# Patient Record
Sex: Female | Born: 1995
Health system: Southern US, Community
[De-identification: ages and names within clinical notes are randomized; demographics above are authoritative.]

## PROBLEM LIST (undated history)

## (undated) DIAGNOSIS — R519 Headache, unspecified: Secondary | ICD-10-CM

## (undated) DIAGNOSIS — F810 Specific reading disorder: Secondary | ICD-10-CM

## (undated) DIAGNOSIS — E039 Hypothyroidism, unspecified: Secondary | ICD-10-CM

## (undated) DIAGNOSIS — L299 Pruritus, unspecified: Secondary | ICD-10-CM

## (undated) DIAGNOSIS — F1311 Sedative, hypnotic or anxiolytic abuse, in remission: Secondary | ICD-10-CM

## (undated) DIAGNOSIS — F909 Attention-deficit hyperactivity disorder, unspecified type: Secondary | ICD-10-CM

## (undated) DIAGNOSIS — R413 Other amnesia: Secondary | ICD-10-CM

## (undated) DIAGNOSIS — R51 Headache: Secondary | ICD-10-CM

## (undated) DIAGNOSIS — Z87820 Personal history of traumatic brain injury: Secondary | ICD-10-CM

## (undated) DIAGNOSIS — L508 Other urticaria: Secondary | ICD-10-CM

## (undated) HISTORY — DX: Specific reading disorder: F81.0

## (undated) HISTORY — DX: Sedative, hypnotic or anxiolytic abuse, in remission: F13.11

## (undated) HISTORY — DX: Pruritus, unspecified: L29.9

## (undated) HISTORY — DX: Headache: R51

## (undated) HISTORY — DX: Personal history of traumatic brain injury: Z87.820

## (undated) HISTORY — DX: Attention-deficit hyperactivity disorder, unspecified type: F90.9

## (undated) HISTORY — DX: Other urticaria: L50.8

## (undated) HISTORY — PX: BUNIONECTOMY: SHX129

## (undated) HISTORY — DX: Other amnesia: R41.3

## (undated) HISTORY — DX: Headache, unspecified: R51.9

---
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
## 2016-01-07 ENCOUNTER — Telehealth: Payer: Self-pay | Admitting: Internal Medicine

## 2016-01-07 MED ORDER — ATOMOXETINE HCL 40 MG PO CAPS: 40.0000 mg | ORAL_CAPSULE | Freq: Two times a day (BID) | ORAL | Status: DC

## 2016-01-07 NOTE — Telephone Encounter (Signed)
Pt is requesting refill on Strattera. Dr. Leta JunglingPaz's Pt.  Last OV: 06/02/2015 Last Fill: 09/22/2015 #60 and 1RF UDS: None

## 2016-01-07 NOTE — Telephone Encounter (Signed)
°  Relationship to patient: Self  Can be reached: [phone]   Reason for call: Request refill on atomoxetine (STRATTERA) 40 MG capsule [[id number]][[id number]]   Please leave message on VM when ready for pick up

## 2016-01-08 DIAGNOSIS — F902 Attention-deficit hyperactivity disorder, combined type: Secondary | ICD-10-CM | POA: Diagnosis not present

## 2016-01-15 DIAGNOSIS — F902 Attention-deficit hyperactivity disorder, combined type: Secondary | ICD-10-CM | POA: Diagnosis not present

## 2016-01-22 DIAGNOSIS — F902 Attention-deficit hyperactivity disorder, combined type: Secondary | ICD-10-CM | POA: Diagnosis not present

## 2016-01-28 DIAGNOSIS — F902 Attention-deficit hyperactivity disorder, combined type: Secondary | ICD-10-CM | POA: Diagnosis not present

## 2016-02-03 ENCOUNTER — Encounter: Payer: Self-pay | Admitting: Physician Assistant

## 2016-02-03 ENCOUNTER — Ambulatory Visit (INDEPENDENT_AMBULATORY_CARE_PROVIDER_SITE_OTHER): Payer: BLUE CROSS/BLUE SHIELD | Admitting: Physician Assistant

## 2016-02-03 VITALS — BP 104/66 | HR 100 | Temp 100.0°F | Resp 16 | Ht 69.5 in | Wt 149.5 lb

## 2016-02-03 DIAGNOSIS — J020 Streptococcal pharyngitis: Secondary | ICD-10-CM

## 2016-02-03 LAB — POCT RAPID STREP A (OFFICE): Rapid Strep A Screen: NEGATIVE

## 2016-02-03 MED ORDER — AMOXICILLIN 500 MG PO CAPS: 500.0000 mg | ORAL_CAPSULE | Freq: Two times a day (BID) | ORAL | 0 refills | Status: DC

## 2016-02-03 NOTE — Progress Notes (Signed)
Patient presents to clinic today c/o 3 days of significant sore throat, fever, chills and body aches. Denies nasal congestion, cough or chest congestion. Denies recent travel or sick contact. Patient has not taken anything for symptoms.  Past Medical History:  Diagnosis Date  . ADHD (attention deficit hyperactivity disorder)   . H/O multiple concussions    seen by neuro  . Headache    frequent  . Short-term memory loss    cause from multiple concussions?    Current Outpatient Prescriptions on File Prior to Visit  Medication Sig Dispense Refill  . atomoxetine (STRATTERA) 40 MG capsule Take 1 capsule (40 mg total) by mouth 2 (two) times daily. 60 capsule 3  . etonogestrel (IMPLANON) 68 MG IMPL implant Inject 1 each into the skin once.    . meloxicam (MOBIC) 15 MG tablet TK 1 T PO QD AS NEEDED WF  6  . methocarbamol (ROBAXIN) 500 MG tablet Take 1 tablet (500 mg total) by mouth 2 (two) times daily. (Patient taking differently: Take 500 mg by mouth 2 (two) times daily as needed. ) 20 tablet 0   No current facility-administered medications on file prior to visit.     No Known Allergies  Family History  Problem Relation Age of Onset  . Adopted: Yes  . Family history unknown: Yes    Social History   Social History  . Marital status: Single    Spouse name: N/A  . Number of children: 0  . Years of education: N/A   Occupational History  . senior in [employer]    Social History Main Topics  . Smoking status: Never Smoker  . Smokeless tobacco: Never Used  . Alcohol use No  . Drug use: No  . Sexual activity: Yes    Birth control/ protection: Implant   Other Topics Concern  . None   Social History Narrative   Household-- father and mother    Going to [place] [employer], [place]   To play Volleyball    Review of Systems - See HPI.  All other ROS are negative.  BP 104/66 (BP Location: Right Arm, Patient Position: Sitting, Cuff Size: Normal)   Pulse 100   Temp 100 F (37.8  C) (Oral)   Resp 16   Ht 5' 9.5" (1.765 m)   Wt 149 lb 8 oz (67.8 kg)   SpO2 99%   BMI 21.76 kg/m   Physical Exam  Constitutional: She is oriented to person, place, and time and well-developed, well-nourished, and in no distress.  HENT:  Head: Normocephalic and atraumatic.  Right Ear: Tympanic membrane normal.  Left Ear: Tympanic membrane normal.  Nose: Nose normal.  Mouth/Throat: Uvula is midline and mucous membranes are normal. Oropharyngeal exudate and posterior oropharyngeal erythema present. No tonsillar abscesses.  Eyes: Conjunctivae are normal.  Cardiovascular: Normal rate, regular rhythm, normal heart sounds and intact distal pulses.   Pulmonary/Chest: Effort normal and breath sounds normal. No respiratory distress. She has no wheezes. She has no rales. She exhibits no tenderness.  Lymphadenopathy:       Head (right side): Tonsillar adenopathy present.       Head (left side): Tonsillar adenopathy present.    She has no cervical adenopathy.  Neurological: She is alert and oriented to person, place, and time.  Skin: Skin is warm and dry. No rash noted.  Psychiatric: Affect normal.  Vitals reviewed.  Assessment/Plan: 1. Streptococcal sore throat Rapid strep negative. Patient with fever, tonisllar adenopathy, tonsillar exudate and  absence of cough. Will send for culture but will empirically treat for Strep. Rx Amoxicillin. Supportive measures reviewed. FU if not resolving.  - amoxicillin (AMOXIL) 500 MG capsule; Take 1 capsule (500 mg total) by mouth 2 (two) times daily.  Dispense: 20 capsule; Refill: 0 - Culture, Group A Strep   Piedad ClimesMartin, Oreta Soloway Cody, PA-C

## 2016-02-03 NOTE — Patient Instructions (Signed)
Please stay well hydrated. Take the antibiotic as directed with food. Use Tylenol or ibuprofen for throat pain, fever and aches. Salt water gargles may also be beneficial. Your contagious until on antibiotics for 24 hours. Stay home from work today. Follow-up if symptoms not resolving with antibiotic. I will call you with your throat culture results.   Strep Throat Strep throat is an infection of the throat. It is caused by germs. Strep throat spreads from person to person because of coughing, sneezing, or close contact. HOME CARE Medicines  Take over-the-counter and prescription medicines only as told by your doctor.  Take your antibiotic medicine as told by your doctor. Do not stop taking the medicine even if you feel better.  Have family members who also have a sore throat or fever go to a doctor. Eating and Drinking  Do not share food, drinking cups, or personal items.  Try eating soft foods until your sore throat feels better.  Drink enough fluid to keep your pee (urine) clear or pale yellow. General Instructions  Rinse your mouth (gargle) with a salt-water mixture 3-4 times per day or as needed. To make a salt-water mixture, stir -1 tsp of salt into 1 cup of warm water.  Make sure that all people in your house wash their hands well.  Rest.  Stay home from school or work until you have been taking antibiotics for 24 hours.  Keep all follow-up visits as told by your doctor. This is important. GET HELP IF:  Your neck keeps getting bigger.  You get a rash, cough, or earache.  You cough up thick liquid that is green, yellow-brown, or bloody.  You have pain that does not get better with medicine.  Your problems get worse instead of getting better.  You have a fever. GET HELP RIGHT AWAY IF:  You throw up (vomit).  You get a very bad headache.  You neck hurts or it feels stiff.  You have chest pain or you are short of breath.  You have drooling, very bad  throat pain, or changes in your voice.  Your neck is swollen or the skin gets red and tender.  Your mouth is dry or you are peeing less than normal.  You keep feeling more tired or it is hard to wake up.  Your joints are red or they hurt.   This information is not intended to replace advice given to you by your health care provider. Make sure you discuss any questions you have with your health care provider.   Document Released: 11/24/2007 Document Revised: 02/26/2015 Document Reviewed: 09/30/2014 Elsevier Interactive Patient Education [employer]2016 Elsevier Inc.

## 2016-02-05 LAB — CULTURE, GROUP A STREP: Organism ID, Bacteria: NORMAL

## 2016-02-12 DIAGNOSIS — F902 Attention-deficit hyperactivity disorder, combined type: Secondary | ICD-10-CM | POA: Diagnosis not present

## 2016-03-09 DIAGNOSIS — Z1389 Encounter for screening for other disorder: Secondary | ICD-10-CM | POA: Diagnosis not present

## 2016-03-09 DIAGNOSIS — Z01419 Encounter for gynecological examination (general) (routine) without abnormal findings: Secondary | ICD-10-CM | POA: Diagnosis not present

## 2016-03-09 DIAGNOSIS — Z3009 Encounter for other general counseling and advice on contraception: Secondary | ICD-10-CM | POA: Diagnosis not present

## 2016-03-09 DIAGNOSIS — Z130 Encounter for screening for diseases of the blood and blood-forming organs and certain disorders involving the immune mechanism: Secondary | ICD-10-CM | POA: Diagnosis not present

## 2016-03-09 DIAGNOSIS — Z6822 Body mass index (BMI) 22.0-22.9, adult: Secondary | ICD-10-CM | POA: Diagnosis not present

## 2016-03-09 DIAGNOSIS — Z113 Encounter for screening for infections with a predominantly sexual mode of transmission: Secondary | ICD-10-CM | POA: Diagnosis not present

## 2016-06-28 ENCOUNTER — Other Ambulatory Visit: Payer: Self-pay | Admitting: Family Medicine

## 2016-06-29 ENCOUNTER — Other Ambulatory Visit: Payer: Self-pay | Admitting: Internal Medicine

## 2016-06-29 ENCOUNTER — Telehealth: Payer: Self-pay | Admitting: Internal Medicine

## 2016-06-29 MED ORDER — ATOMOXETINE HCL 40 MG PO CAPS: 40.0000 mg | ORAL_CAPSULE | Freq: Two times a day (BID) | ORAL | 0 refills | Status: DC

## 2016-06-29 NOTE — Telephone Encounter (Signed)
Please inform Pt that 30 day supply has been sent, will need appt w/ PCP for further refills. Thank you.

## 2016-06-29 NOTE — Telephone Encounter (Signed)
Patient is requesting a refill of atomoxetine (STRATTERA) 40 MG capsule Please advise   Pharmacy: [facility] [id number] - [place], [place] - [address] AT [facility] [address]

## 2016-06-29 NOTE — Telephone Encounter (Signed)
Patient aware. She will call to schedule her appt once she gets her work schedule.

## 2016-06-29 NOTE — Telephone Encounter (Signed)
Pt is requesting refill on Strattera.  Last OV: 06/02/2015, no future appt scheduled Last Fill: 01/07/2016 #60 and 3RF UDS: None  Please advise.

## 2016-06-29 NOTE — Telephone Encounter (Signed)
Needs office visit Okay to prescribed #30 but needs an appointment for a checkup

## 2016-07-10 DIAGNOSIS — Z113 Encounter for screening for infections with a predominantly sexual mode of transmission: Secondary | ICD-10-CM | POA: Diagnosis not present

## 2016-07-26 ENCOUNTER — Other Ambulatory Visit: Payer: Self-pay | Admitting: Internal Medicine

## 2016-07-29 ENCOUNTER — Encounter: Payer: Self-pay | Admitting: Internal Medicine

## 2016-07-29 ENCOUNTER — Ambulatory Visit (INDEPENDENT_AMBULATORY_CARE_PROVIDER_SITE_OTHER): Payer: BLUE CROSS/BLUE SHIELD | Admitting: Internal Medicine

## 2016-07-29 VITALS — BP 116/66 | HR 72 | Temp 97.6°F | Resp 12 | Ht 69.5 in | Wt 147.4 lb

## 2016-07-29 DIAGNOSIS — Z114 Encounter for screening for human immunodeficiency virus [HIV]: Secondary | ICD-10-CM

## 2016-07-29 DIAGNOSIS — F418 Other specified anxiety disorders: Secondary | ICD-10-CM | POA: Diagnosis not present

## 2016-07-29 DIAGNOSIS — F32A Depression, unspecified: Secondary | ICD-10-CM

## 2016-07-29 DIAGNOSIS — R634 Abnormal weight loss: Secondary | ICD-10-CM

## 2016-07-29 DIAGNOSIS — Z23 Encounter for immunization: Secondary | ICD-10-CM | POA: Diagnosis not present

## 2016-07-29 DIAGNOSIS — F988 Other specified behavioral and emotional disorders with onset usually occurring in childhood and adolescence: Secondary | ICD-10-CM | POA: Diagnosis not present

## 2016-07-29 DIAGNOSIS — F419 Anxiety disorder, unspecified: Secondary | ICD-10-CM

## 2016-07-29 DIAGNOSIS — F329 Major depressive disorder, single episode, unspecified: Secondary | ICD-10-CM

## 2016-07-29 LAB — CBC WITH DIFFERENTIAL/PLATELET
Basophils Absolute: 0.1 10*3/uL (ref 0.0–0.1)
Basophils Relative: 0.6 % (ref 0.0–3.0)
Eosinophils Absolute: 0.2 10*3/uL (ref 0.0–0.7)
Eosinophils Relative: 1.7 % (ref 0.0–5.0)
HCT: 45 % (ref 36.0–49.0)
Hemoglobin: 15.5 g/dL (ref 12.0–16.0)
Lymphocytes Relative: 21 % — ABNORMAL LOW (ref 24.0–48.0)
Lymphs Abs: 2 10*3/uL (ref 0.7–4.0)
MCHC: 34.3 g/dL (ref 31.0–37.0)
MCV: 92.7 fl (ref 78.0–98.0)
Monocytes Absolute: 0.7 10*3/uL (ref 0.1–1.0)
Monocytes Relative: 6.8 % (ref 3.0–12.0)
Neutro Abs: 6.8 10*3/uL (ref 1.4–7.7)
Neutrophils Relative %: 69.9 % (ref 43.0–71.0)
Platelets: 289 10*3/uL (ref 150.0–575.0)
RBC: 4.86 Mil/uL (ref 3.80–5.70)
RDW: 13.1 % (ref 11.4–15.5)
WBC: 9.7 10*3/uL (ref 4.5–13.5)

## 2016-07-29 LAB — COMPREHENSIVE METABOLIC PANEL
ALT: 12 U/L (ref 0–35)
AST: 15 U/L (ref 0–37)
Albumin: 4.6 g/dL (ref 3.5–5.2)
Alkaline Phosphatase: 58 U/L (ref 47–119)
BUN: 10 mg/dL (ref 6–23)
CO2: 27 mEq/L (ref 19–32)
Calcium: 9.7 mg/dL (ref 8.4–10.5)
Chloride: 108 mEq/L (ref 96–112)
Creatinine, Ser: 0.89 mg/dL (ref 0.40–1.20)
GFR: 86.3 mL/min (ref >60.00)
Glucose, Bld: 86 mg/dL (ref 70–99)
Potassium: 3.6 mEq/L (ref 3.5–5.1)
Sodium: 140 mEq/L (ref 135–145)
Total Bilirubin: 1.1 mg/dL (ref 0.2–1.2)
Total Protein: 7.6 g/dL (ref 6.0–8.3)

## 2016-07-29 LAB — TSH: TSH: 0.79 u[IU]/mL (ref 0.40–5.00)

## 2016-07-29 MED ORDER — ATOMOXETINE HCL 40 MG PO CAPS: 40.0000 mg | ORAL_CAPSULE | Freq: Two times a day (BID) | ORAL | 2 refills | Status: DC

## 2016-07-29 NOTE — Progress Notes (Signed)
Pre visit review using our clinic review tool, if applicable. No additional management support is needed unless otherwise documented below in the visit note. 

## 2016-07-29 NOTE — Progress Notes (Signed)
Subjective:    Patient ID: Tabitha Hawkins, female    DOB: 09/19/1995, 21 y.o.   MRN: [id number]  DOS:  07/29/2016 Type of visit - description : Routine office visit Interval history: Here for refill of Strattera, she takes it daily, feeling great, it help her ADD without apparent side effects. I also noted significant weight loss.  She does have some anxiety and depression, not a major issue now, sees her counselor. She sleeps well Her appetite is reportedly okay. She denies being eating although there are days that she is very hungry and others she is not. Denies any self-induced vomiting. No suicidal ideas No abdominal pain, nausea vomiting . Occasional diarrhea particularly when she does not take her medication.   Wt Readings from Last 3 Encounters:  07/29/16 147 lb 6 oz (66.8 kg) (78 %, Z= 0.78)*  02/03/16 149 lb 8 oz (67.8 kg) (81 %, Z= 0.89)*  07/28/15 180 lb (81.6 kg) (95 %, Z= 1.65)*   * Growth percentiles are based on CDC 2-20 Years data.     Review of Systems See above No chest pain or palpitation  Past Medical History:  Diagnosis Date  . ADHD (attention deficit hyperactivity disorder)   . H/O multiple concussions    seen by neuro  . Headache    frequent  . Short-term memory loss    cause from multiple concussions?    Past Surgical History:  Procedure Laterality Date  . KNEE ARTHROSCOPY Left 2016    Social History   Social History  . Marital status: Single    Spouse name: N/A  . Number of children: 0  . Years of education: N/A   Occupational History  . waitress     Social History Main Topics  . Smoking status: Never Smoker  . Smokeless tobacco: Never Used  . Alcohol use No  . Drug use: No  . Sexual activity: Yes    Birth control/ protection: Implant   Other Topics Concern  . Not on file   Social History Narrative   Household-- father and mother    Stopped [employer]t [place]ussion, couldn't play volley-ball 05-2015            Allergies as of 07/29/2016   No Known Allergies     Medication List       Accurate as of 07/29/16 11:59 PM. Always use your most recent med list.          atomoxetine 40 MG capsule Commonly known as:  STRATTERA Take 1 capsule (40 mg total) by mouth 2 (two) times daily.   etonogestrel 68 MG Impl implant Commonly known as:  NEXPLANON Inject 1 each into the skin once.          Objective:   Physical Exam BP 116/66 (BP Location: Left Arm, Patient Position: Sitting, Cuff Size: Small)   Pulse 72   Temp 97.6 F (36.4 C) (Oral)   Resp 12   Ht 5' 9.5" (1.765 m)   Wt 147 lb 6 oz (66.8 kg)   SpO2 99%   BMI 21.45 kg/m  General:   Well developed, well nourished . NAD.  HEENT:  Normocephalic . Face symmetric, atraumatic. Neck: No thyromegaly Lungs:  CTA B Normal respiratory effort, no intercostal retractions, no accessory muscle use. Heart: RRR,  no murmur.  no pretibial edema bilaterally  Abdomen:  Not distended, soft, non-tender. No rebound or rigidity.  Skin: Not pale. Not jaundice Neurologic:  alert &  oriented X3.  Speech normal, gait appropriate for age and unassisted Psych--  Cognition and judgment appear intact.  Cooperative with normal attention span and concentration.  Behavior appropriate. No anxious or depressed appearing.    Assessment & Plan:   Assessment ADD- Saw a psychologist 2015, DX ADHD, inattentive type. Rx other of her first time 08-2013 Postconcussion syndrome: DX 05-2015, saw neuro- d/c college sports  Neuropsychological evaluation 08/13/2015 referred by neurology, report reviewed, dx  ADD with hyperactivity; + sx of  postconcussion including headaches. Anxiety-depression  PLAN: ADD: Well-controlled with Strattera, RF meds Weight loss: Started to loose wt last year, that coincide with the switch from Adderall to Strattera 04-2015 and leaving college 05/2015 due to a head concussion, she also stopped playing sports. I am somewhat  concerned about her weight loss, she however looks healthy, denies depression, no obvious eating disorders. Her BMI is still healthy. Plan: CMP, CBC, TSH. HIV screening. Recheck in 3 months. 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 She sees a counselor Theador Hawthorne as needed. RTC 3 months

## 2016-07-29 NOTE — Patient Instructions (Addendum)
GO TO THE LAB : Get the blood work     GO TO THE FRONT DESK Schedule your next appointment for a  checkup in 3 months  

## 2016-07-30 ENCOUNTER — Ambulatory Visit: Payer: BLUE CROSS/BLUE SHIELD | Admitting: Internal Medicine

## 2016-07-30 DIAGNOSIS — F329 Major depressive disorder, single episode, unspecified: Secondary | ICD-10-CM

## 2016-07-30 DIAGNOSIS — F419 Anxiety disorder, unspecified: Secondary | ICD-10-CM | POA: Insufficient documentation

## 2016-07-30 DIAGNOSIS — F32A Depression, unspecified: Secondary | ICD-10-CM | POA: Insufficient documentation

## 2016-07-30 HISTORY — DX: Major depressive disorder, single episode, unspecified: F32.9

## 2016-07-30 LAB — HIV ANTIBODY (ROUTINE TESTING W REFLEX): HIV 1&2 Ab, 4th Generation: NONREACTIVE

## 2016-07-30 NOTE — Assessment & Plan Note (Signed)
ADD: Well-controlled with Strattera, RF meds Weight loss: Started to loose wt last year, that coincide with the switch from Adderall to Strattera 04-2015 and leaving college 05/2015 due to a head concussion, she also stopped playing sports. I am somewhat concerned about her weight loss, she however looks healthy, denies depression, no obvious eating disorders. Her BMI is still healthy. Plan: CMP, CBC, TSH. HIV screening. Recheck in 3 months. 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 She sees a counselor Theador HawthorneAlicia Brown as needed. RTC 3 months

## 2016-09-29 ENCOUNTER — Telehealth: Payer: Self-pay | Admitting: Internal Medicine

## 2016-09-29 NOTE — Telephone Encounter (Signed)
Caller name: Relationship to patient: Self Can be reached: [phone]  Pharmacy:  Reason for call: patient requesting info on stopping atomoxetine (STRATTERA) 40 MG capsule

## 2016-09-29 NOTE — Telephone Encounter (Signed)
Please advise. Pt is due for 3 month follow-up 10/2016 (no future appt scheduled).

## 2016-09-30 NOTE — Telephone Encounter (Signed)
Spoke w/ Pt, informed of recommendations, she has already been taking only 1 tablet of Strattera daily, informed she can stop when ready and to let us know if problems. Pt verbalized understanding.

## 2016-09-30 NOTE — Telephone Encounter (Signed)
The medication can be stopped abruptly however I would prefer her to stop gradually. Strattera 10 mg: 2 tablets daily for 2 weeks, then one tablet daily for 2 weeks, then stop call #30, 1 refill Monitory symptoms of ADHD, call if problems.

## 2016-11-11 DIAGNOSIS — Z113 Encounter for screening for infections with a predominantly sexual mode of transmission: Secondary | ICD-10-CM | POA: Diagnosis not present

## 2016-11-11 DIAGNOSIS — N760 Acute vaginitis: Secondary | ICD-10-CM | POA: Diagnosis not present

## 2016-11-11 DIAGNOSIS — Z3049 Encounter for surveillance of other contraceptives: Secondary | ICD-10-CM | POA: Diagnosis not present

## 2016-11-11 DIAGNOSIS — L293 Anogenital pruritus, unspecified: Secondary | ICD-10-CM | POA: Diagnosis not present

## 2016-11-18 DIAGNOSIS — N760 Acute vaginitis: Secondary | ICD-10-CM | POA: Diagnosis not present

## 2016-11-18 DIAGNOSIS — Z113 Encounter for screening for infections with a predominantly sexual mode of transmission: Secondary | ICD-10-CM | POA: Diagnosis not present

## 2016-11-18 DIAGNOSIS — L293 Anogenital pruritus, unspecified: Secondary | ICD-10-CM | POA: Diagnosis not present

## 2016-12-30 DIAGNOSIS — B002 Herpesviral gingivostomatitis and pharyngotonsillitis: Secondary | ICD-10-CM | POA: Diagnosis not present

## 2017-01-27 DIAGNOSIS — Z3049 Encounter for surveillance of other contraceptives: Secondary | ICD-10-CM | POA: Diagnosis not present

## 2017-02-03 DIAGNOSIS — J029 Acute pharyngitis, unspecified: Secondary | ICD-10-CM | POA: Diagnosis not present

## 2017-02-03 DIAGNOSIS — J069 Acute upper respiratory infection, unspecified: Secondary | ICD-10-CM | POA: Diagnosis not present

## 2017-02-07 ENCOUNTER — Ambulatory Visit: Payer: BLUE CROSS/BLUE SHIELD | Admitting: Internal Medicine

## 2017-02-07 ENCOUNTER — Telehealth: Payer: Self-pay | Admitting: Internal Medicine

## 2017-02-07 NOTE — Telephone Encounter (Signed)
Caller name: Marchi,[name] Relation to pt: mother  Call back [phone] Pharmacy: [id number] - [place], [place] - [address] AT [facility] OF [address] & [facility] [address] [phone] (Phone) [phone] (Fax)     Reason for call:  Patient was seen at [facility] on Wednesday they recommended OTC NOREL AND MUNEX DM, mother states not working, patient cough has worsen, requesting RX for cough,please advise

## 2017-02-07 NOTE — Telephone Encounter (Signed)
Patient scheduled with PCP today at 4pm.  

## 2017-02-07 NOTE — Telephone Encounter (Signed)
Will need to see Pt in office if not improving.

## 2017-02-08 ENCOUNTER — Other Ambulatory Visit: Payer: Self-pay

## 2017-02-09 ENCOUNTER — Encounter: Payer: Self-pay | Admitting: Internal Medicine

## 2017-02-09 ENCOUNTER — Ambulatory Visit (INDEPENDENT_AMBULATORY_CARE_PROVIDER_SITE_OTHER): Payer: BLUE CROSS/BLUE SHIELD | Admitting: Internal Medicine

## 2017-02-09 VITALS — BP 106/74 | HR 74 | Temp 98.1°F | Resp 14 | Ht 69.5 in | Wt 147.2 lb

## 2017-02-09 DIAGNOSIS — J45909 Unspecified asthma, uncomplicated: Secondary | ICD-10-CM | POA: Insufficient documentation

## 2017-02-09 DIAGNOSIS — J4521 Mild intermittent asthma with (acute) exacerbation: Secondary | ICD-10-CM

## 2017-02-09 HISTORY — DX: Unspecified asthma, uncomplicated: J45.909

## 2017-02-09 MED ORDER — ALBUTEROL SULFATE HFA 108 (90 BASE) MCG/ACT IN AERS: 2.0000 | INHALATION_SPRAY | Freq: Four times a day (QID) | RESPIRATORY_TRACT | 1 refills | Status: DC | PRN

## 2017-02-09 MED ORDER — PREDNISONE 10 MG PO TABS: ORAL_TABLET | ORAL | 0 refills | Status: DC

## 2017-02-09 MED ORDER — BUDESONIDE-FORMOTEROL FUMARATE 160-4.5 MCG/ACT IN AERO: 2.0000 | INHALATION_SPRAY | Freq: Two times a day (BID) | RESPIRATORY_TRACT | 3 refills | Status: DC

## 2017-02-09 MED ORDER — AZITHROMYCIN 250 MG PO TABS: ORAL_TABLET | ORAL | 0 refills | Status: DC

## 2017-02-09 NOTE — Progress Notes (Signed)
Pre visit review using our clinic review tool, if applicable. No additional management support is needed unless otherwise documented below in the visit note. 

## 2017-02-09 NOTE — Assessment & Plan Note (Signed)
Asthma exacerbation: history > than physical exam suggest asthma exacerbation for the last 2 weeks, not better with OTCs. Plan: Prednisone, Zithromax, switch Qvar to Symbicort. Mucinex and Flonase. Call if no better. Once better, she will let me know, consider go back on Qvar.

## 2017-02-09 NOTE — Patient Instructions (Signed)
Rest, fluids , tylenol   For cough:  Take Mucinex DM twice a day as needed until better   For nasal congestion: Use OTC   Flonase : 2 nasal sprays on each side of the nose in the morning until you feel better   For wheezing: Stop Qvar Symbicort: 1 puff twice a day Albuterol: Rescue inhaler, 2 puffs every 6 hours if the wheezing and cough continue   Take the prednisone as prescribed  Take the antibiotic as prescribed , zithromax  Call if not gradually better over the next  10 days  In few weeks, if you're doing well, we can go back on Qvar

## 2017-02-09 NOTE — Progress Notes (Signed)
Subjective:    Patient ID: Tabitha Hawkins, female    DOB: [birth year], 21 y.o.   MRN: [id number]  DOS:  02/09/2017 Type of visit - description : acute, Here with her mother Interval history: Symptoms started 2 weeks ago with mostly cough, persistent, worse at night, increased by laughing. Went to urgent care, was recommended a number of OTCs but she is not better.  Wt Readings from Last 3 Encounters:  02/09/17 147 lb 4 oz (66.8 kg)  07/29/16 147 lb 6 oz (66.8 kg) (78 %, Z= 0.78)*  02/03/16 149 lb 8 oz (67.8 kg) (81 %, Z= 0.89)*   * Growth percentiles are based on CDC 2-20 Years data.     Review of Systems No fever chills No sinus pain but + sinus congestion and postnasal dripping. Mild shortness of breath. + Clear sputum and wheezing  Past Medical History:  Diagnosis Date  . ADHD (attention deficit hyperactivity disorder)   . H/O multiple concussions    seen by neuro  . Headache    frequent  . Short-term memory loss    cause from multiple concussions?    Past Surgical History:  Procedure Laterality Date  . KNEE ARTHROSCOPY Left 2016    Social History   Social History  . Marital status: Single    Spouse name: N/A  . Number of children: 0  . Years of education: N/A   Occupational History  . waitress     Social History Main Topics  . Smoking status: Never Smoker  . Smokeless tobacco: Never Used  . Alcohol use No  . Drug use: No  . Sexual activity: Yes    Birth control/ protection: Implant   Other Topics Concern  . Not on file   Social History Narrative   Household-- father and mother    Stopped college [employer], [place] d/t a concussion, couldn't play volley-ball 05-2015          Allergies as of 02/09/2017   No Known Allergies     Medication List       Accurate as of 02/09/17  5:29 PM. Always use your most recent med list.          albuterol 108 (90 Base) MCG/ACT inhaler Commonly known as:  VENTOLIN HFA Inhale 2 puffs into the lungs  every 6 (six) hours as needed for wheezing or shortness of breath.   azithromycin 250 MG tablet Commonly known as:  ZITHROMAX Z-PAK 2 tabs a day the first day, then 1 tab a day x 4 days   budesonide-formoterol 160-4.5 MCG/ACT inhaler Commonly known as:  SYMBICORT Inhale 2 puffs into the lungs 2 (two) times daily.   etonogestrel 68 MG Impl implant Commonly known as:  NEXPLANON 1 each by Subdermal route once.   MUCINEX DM 30-600 MG Tb12 TK 1 T PO BID PRN   predniSONE 10 MG tablet Commonly known as:  DELTASONE 4 tablets x 2 days, 3 tabs x 2 days, 2 tabs x 2 days, 1 tab x 2 days            Discharge Care Instructions        Start     Ordered   02/09/17 0000  predniSONE (DELTASONE) 10 MG tablet     02/09/17 1322   02/09/17 0000  azithromycin (ZITHROMAX Z-PAK) 250 MG tablet     02/09/17 1322   02/09/17 0000  budesonide-formoterol (SYMBICORT) 160-4.5 MCG/ACT inhaler  2 times daily     02/09/17  1322   02/09/17 0000  albuterol (VENTOLIN HFA) 108 (90 Base) MCG/ACT inhaler  Every 6 hours PRN     02/09/17 1323         Objective:   Physical Exam BP 106/74 (BP Location: Left Arm, Patient Position: Sitting, Cuff Size: Small)   Pulse 74   Temp 98.1 F (36.7 C) (Oral)   Resp 14   Ht 5' 9.5" (1.765 m)   Wt 147 lb 4 oz (66.8 kg)   SpO2 96%   BMI 21.43 kg/m  General:   Well developed, well nourished . NAD.  HEENT:  Normocephalic . Face symmetric, atraumatic. Nose congested, TMs normal, throat symmetric Lungs:  + Rhonchi bilaterally, no crackles. Slightly increased expiratory time Normal respiratory effort, no intercostal retractions, no accessory muscle use. Heart: RRR,  no murmur.  No pretibial edema bilaterally  Skin: Not pale. Not jaundice Neurologic:  alert & oriented X3.  Speech normal, gait appropriate for age and unassisted Psych--  Cognition and judgment appear intact.  Cooperative with normal attention span and concentration.  Behavior appropriate. No  anxious or depressed appearing.      Assessment & Plan:    Assessment Asthma ADD- Saw a psychologist 2015, DX ADHD, inattentive type. Rx other of her first time 08-2013 Postconcussion syndrome: DX 05-2015, saw neuro- d/c college sports  Neuropsychological evaluation 08/13/2015 referred by neurology, report reviewed, dx  ADD with hyperactivity; + sx of  postconcussion including headaches. Anxiety-depression  PLAN: Asthma exacerbation: history > than physical exam suggest asthma exacerbation for the last 2 weeks, not better with OTCs. Plan: Prednisone, Zithromax, switch Qvar to Symbicort. Mucinex and Flonase. Call if no better. Once better, she will let me know, consider go back on Qvar.

## 2017-06-01 ENCOUNTER — Encounter: Payer: Self-pay | Admitting: Internal Medicine

## 2017-06-01 ENCOUNTER — Ambulatory Visit: Payer: BLUE CROSS/BLUE SHIELD | Admitting: Internal Medicine

## 2017-06-01 VITALS — BP 108/62 | HR 63 | Temp 98.1°F | Resp 14 | Ht 69.5 in | Wt 150.0 lb

## 2017-06-01 DIAGNOSIS — F419 Anxiety disorder, unspecified: Secondary | ICD-10-CM

## 2017-06-01 DIAGNOSIS — F329 Major depressive disorder, single episode, unspecified: Secondary | ICD-10-CM | POA: Diagnosis not present

## 2017-06-01 DIAGNOSIS — F32A Depression, unspecified: Secondary | ICD-10-CM

## 2017-06-01 MED ORDER — FLUOXETINE HCL 20 MG PO TABS: 20.0000 mg | ORAL_TABLET | Freq: Every day | ORAL | 1 refills | Status: DC

## 2017-06-01 NOTE — Patient Instructions (Signed)
  GO TO THE FRONT DESK Schedule your next appointment for a checkup in 4 weeks  Start fluoxetine 20 mg: Half tablet the first week, then 1 tablet. Watch suicidal ideas, seek help immediately  Behavioral 24-Hour HELPLINE [phone] or [phone]

## 2017-06-01 NOTE — Progress Notes (Signed)
Subjective:    Patient ID: Tabitha Hawkins, female    DOB: 10/06/1995, 21 y.o.   MRN: [id number]  DOS:  06/01/2017 Type of visit - description : acute, here w/ her mother Interval history: She has a long history of anxiety and depression,  symptoms have been low/ manageable for long time but in the last 2 years they are increasing gradually. Finally this morning, she text her mother asking for help.  Had suicidal ideas. Symptoms are: Lack of interest, disconnected from friends, easily frustrated, occasional crying. I asked about bipolar symptoms, the mother reports that there are days that she feels very well but suddenly get depressed again, usually due to a trigger (not spontaneously). She drinks socially, smokes marijuana often.  No other  Drugs. She has a Implanon for birth control, has a boyfriend but she does not feel he is the source of her symptoms .   Review of Systems   Past Medical History:  Diagnosis Date  . ADHD (attention deficit hyperactivity disorder)   . H/O multiple concussions    seen by neuro  . Headache    frequent  . Short-term memory loss    cause from multiple concussions?    Past Surgical History:  Procedure Laterality Date  . KNEE ARTHROSCOPY Left 2016    Social History   Socioeconomic History  . Marital status: Single    Spouse name: Not on file  . Number of children: 0  . Years of education: Not on file  . Highest education level: Not on file  Social Needs  . Financial resource strain: Not on file  . Food insecurity - worry: Not on file  . Food insecurity - inability: Not on file  . Transportation needs - medical: Not on file  . Transportation needs - non-medical: Not on file  Occupational History  . Occupation: waitress   Tobacco Use  . Smoking status: Never Smoker  . Smokeless tobacco: Never Used  Substance and Sexual Activity  . Alcohol use: No  . Drug use: No  . Sexual activity: Yes    Birth control/protection: Implant  Other  Topics Concern  . Not on file  Social History Narrative   Household-- father and mother    Stopped [employer]t [place] concussion, couldn't play volley-ball 05-2015          Allergies as of 06/01/2017   No Known Allergies     Medication List        Accurate as of 06/01/17  3:23 PM. Always use your most recent med list.          albuterol 108 (90 Base) MCG/ACT inhaler Commonly known as:  VENTOLIN HFA Inhale 2 puffs into the lungs every 6 (six) hours as needed for wheezing or shortness of breath.   azithromycin 250 MG tablet Commonly known as:  ZITHROMAX Z-PAK 2 tabs a day the first day, then 1 tab a day x 4 days   budesonide-formoterol 160-4.5 MCG/ACT inhaler Commonly known as:  SYMBICORT Inhale 2 puffs into the lungs 2 (two) times daily.   etonogestrel 68 MG Impl implant Commonly known as:  NEXPLANON 1 each by Subdermal route once.   MUCINEX DM 30-600 MG Tb12 TK 1 T PO BID PRN   predniSONE 10 MG tablet Commonly known as:  DELTASONE 4 tablets x 2 days, 3 tabs x 2 days, 2 tabs x 2 days, 1 tab x 2 days  Objective:   Physical Exam BP 108/62 (BP Location: Left Arm, Patient Position: Sitting, Cuff Size: Small)   Pulse 63   Temp 98.1 F (36.7 C) (Oral)   Resp 14   Ht 5' 9.5" (1.765 m)   Wt 150 lb (68 kg)   SpO2 98%   BMI 21.83 kg/m  General:   Well developed, well nourished . NAD.  HEENT:  Normocephalic . Face symmetric, atraumatic  Skin: Not pale. Not jaundice Neurologic:  alert & oriented X3.  Speech normal, gait appropriate for age and unassisted Psych--  Cognition and judgment appear intact.  Cooperative with normal attention span and concentration.  Behavior appropriate. Moderately depressed appearing, not anxious, tearful.    Assessment & Plan:   Assessment Asthma ADD- Saw a psychologist 2015, DX ADHD, inattentive type. Rx other of her first time 08-2013 Postconcussion syndrome: DX 05-2015, saw neuro- d/c college  sports  Neuropsychological evaluation 08/13/2015 referred by neurology, report reviewed, dx  ADD with hyperactivity; + sx of  postconcussion including headaches. Anxiety-depression  PLAN: Anxiety-depression: Long h/o those problems, they have being stable for a while but they are increasing. Currently lives independently and works but plans to move back in with her parents by the end of the month; she is planning to talk with Mrs. Manson PasseyBrown her former therapist  We had extensive discussion, she is counseled to the best of my ability. Although she has good days and bad days  I do not suspect strongly she is bipolar thus will initiate SSRIs but if they notice manic episodes, or any unusual behavior, they  will let me know. Plan: Start fluoxetine, 10 mg for a week, then 20 mg, proceed with counseling, avoid marijuana, increase exercise, strongly warned about suicidal ideas,  knows to seek for help immediately.  Mother is aware, asked her to monitor Tabitha Hawkins daily and she states she will. RTC 4 weeks

## 2017-06-01 NOTE — Progress Notes (Signed)
Pre visit review using our clinic review tool, if applicable. No additional management support is needed unless otherwise documented below in the visit note. 

## 2017-06-02 NOTE — Assessment & Plan Note (Signed)
Anxiety-depression: Long h/o those problems, they have being stable for a while but they are increasing. Currently lives independently and works but plans to move back in with her parents by the end of the month; she is planning to talk with Mrs. Manson PasseyBrown her former therapist  We had extensive discussion, she is counseled to the best of my ability. Although she has good days and bad days  I do not suspect strongly she is bipolar thus will initiate SSRIs but if they notice manic episodes, or any unusual behavior, they  will let me know. Plan: Start fluoxetine, 10 mg for a week, then 20 mg, proceed with counseling, avoid marijuana, increase exercise, strongly warned about suicidal ideas,  knows to seek for help immediately.  Mother is aware, asked her to monitor Denny Peonrin daily and she states she will. RTC 4 weeks

## 2017-06-24 ENCOUNTER — Other Ambulatory Visit: Payer: Self-pay | Admitting: Internal Medicine

## 2017-07-07 ENCOUNTER — Ambulatory Visit (INDEPENDENT_AMBULATORY_CARE_PROVIDER_SITE_OTHER): Payer: BLUE CROSS/BLUE SHIELD

## 2017-07-07 ENCOUNTER — Encounter: Payer: Self-pay | Admitting: Podiatry

## 2017-07-07 ENCOUNTER — Ambulatory Visit: Payer: BLUE CROSS/BLUE SHIELD | Admitting: Podiatry

## 2017-07-07 VITALS — BP 115/73 | HR 53 | Resp 18

## 2017-07-07 DIAGNOSIS — M21619 Bunion of unspecified foot: Secondary | ICD-10-CM

## 2017-07-07 DIAGNOSIS — M205X2 Other deformities of toe(s) (acquired), left foot: Secondary | ICD-10-CM | POA: Diagnosis not present

## 2017-07-07 NOTE — Patient Instructions (Signed)

## 2017-07-07 NOTE — Progress Notes (Signed)
Subjective:    Patient ID: Tabitha Hawkins, female    DOB: June 15, 1996, 22 y.o.   MRN: [id number]  HPI  [name] resents the office today for concerns of bilateral bunions as well as contracture to her left second and third toe.  This is been ongoing for several years and she previously did see Dr. Blenda Mounts when she was in sixth grade.  She has been having findings her entire life and at this point she wants to go ahead and proceed with surgical correction as she they are painful for her and they have been getting worse.  She has tried shoe changes, offloading and padding without any significant improvement.  She has no numbness or tingling.  She has no other concerns today.  Review of Systems  All other systems reviewed and are negative.  Past Medical History:  Diagnosis Date  . ADHD (attention deficit hyperactivity disorder)   . H/O multiple concussions    seen by neuro  . Headache    frequent  . Short-term memory loss    cause from multiple concussions?    Past Surgical History:  Procedure Laterality Date  . KNEE ARTHROSCOPY Left 2016     Current Outpatient Medications:  .  albuterol (VENTOLIN HFA) 108 (90 Base) MCG/ACT inhaler, Inhale 2 puffs into the lungs every 6 (six) hours as needed for wheezing or shortness of breath. (Patient not taking: Reported on 06/01/2017), Disp: 1 Inhaler, Rfl: 1 .  budesonide-formoterol (SYMBICORT) 160-4.5 MCG/ACT inhaler, Inhale 2 puffs into the lungs 2 (two) times daily. (Patient not taking: Reported on 06/01/2017), Disp: 1 Inhaler, Rfl: 3 .  etonogestrel (NEXPLANON) 68 MG IMPL implant, 1 each by Subdermal route once., Disp: , Rfl:  .  FLUoxetine (PROZAC) 20 MG tablet, Take 1 tablet (20 mg total) by mouth daily., Disp: 30 tablet, Rfl: 0  No Known Allergies  Social History   Socioeconomic History  . Marital status: Single    Spouse name: Not on file  . Number of children: 0  . Years of education: Not on file  . Highest education level: Not on file   Social Needs  . Financial resource strain: Not on file  . Food insecurity - worry: Not on file  . Food insecurity - inability: Not on file  . Transportation needs - medical: Not on file  . Transportation needs - non-medical: Not on file  Occupational History  . Occupation: waitress   Tobacco Use  . Smoking status: Never Smoker  . Smokeless tobacco: Never Used  Substance and Sexual Activity  . Alcohol use: No  . Drug use: No  . Sexual activity: Yes    Birth control/protection: Implant  Other Topics Concern  . Not on file  Social History Narrative   Household-- father and mother    Stopped college [employer], [place] d/t a concussion, couldn't play volley-ball 05-2015            Objective:   Physical Exam General: AAO x3, NAD  Dermatological: Small hyperkeratotic lesions the dorsal DIPJ of the left second and third toes, there is no edema, erythema or any clinical signs of infection noted.  Mild erythema on the bunion site from irritation side shoes but there is no other increase in warmth or skin breakdown.  No clinical signs of infection noted.  No other open lesions or pre-ulcerative lesions identified today.  Vascular: Dorsalis Pedis artery and Posterior Tibial artery pedal pulses are 2/4 bilateral with immedate capillary fill time.  There is no pain with calf compression, swelling, warmth, erythema.   Neruologic: Grossly intact via light touch bilateral. Protective threshold with Semmes Wienstein monofilament intact to all pedal sites bilateral.   Musculoskeletal: Significant HAV is present bilaterally but the left side worse than the right.  There is first ray hypomobility present.  There is no pain or crepitation with first MTPJ range of motion.  Tenderness on the bunion site.  There is flexion contracture of the DIPJ's of the second and third digits on the left side there is tenderness palpation of the dorsal DIPJ.  No other area of tenderness identified today.   Decreased medial arch height.  Muscular strength 5/5 in all groups tested bilateral.  Gait: Unassisted, Nonantalgic.      Assessment & Plan:  22 year old female with significant HAV bilaterally with mallet toe contractures; L > R -Treatment options discussed including all alternatives, risks, and complications -Etiology of symptoms were discussed -X-rays were obtained and reviewed with the patient.  Significant HAV is present bilaterally as well as flexion contractures of the DIPJ.  Met adductus is present on the right side.  I reviewed the x-rays with the patient and her mother today -We discussed both conservative as well as surgical treatment options.  At this point the patient is requesting surgical intervention she is attempted conservative treatment without any significant improvement.  She did start with the left side first. -We discussed a Lapidus bunionectomy with screw, plate fixation of the left foot as well as DIPJ arthroplasty of the second and third digits. -The incision placement as well as the postoperative course was discussed with the patient. I discussed risks of the surgery which include, but not limited to, infection, bleeding, pain, swelling, need for further surgery, delayed or nonhealing, painful or ugly scar, numbness or sensation changes, over/under correction, recurrence, transfer lesions, further deformity, hardware failure, DVT/PE, loss of toe/foot. Patient understands these risks and wishes to proceed with surgery. The surgical consent was reviewed with the patient all 3 pages were signed. No promises or guarantees were given to the outcome of the procedure. All questions were answered to the best of my ability. Before the surgery the patient was encouraged to call the office if there is any further questions. The surgery will be performed at the [facility] on an outpatient basis.  Trula Slade DPM

## 2017-07-20 ENCOUNTER — Encounter: Payer: Self-pay | Admitting: Podiatry

## 2017-07-20 ENCOUNTER — Other Ambulatory Visit: Payer: Self-pay | Admitting: Podiatry

## 2017-07-20 DIAGNOSIS — M21612 Bunion of left foot: Secondary | ICD-10-CM | POA: Diagnosis not present

## 2017-07-20 DIAGNOSIS — M205X2 Other deformities of toe(s) (acquired), left foot: Secondary | ICD-10-CM | POA: Diagnosis not present

## 2017-07-20 DIAGNOSIS — M25572 Pain in left ankle and joints of left foot: Secondary | ICD-10-CM | POA: Diagnosis not present

## 2017-07-20 DIAGNOSIS — M2042 Other hammer toe(s) (acquired), left foot: Secondary | ICD-10-CM | POA: Diagnosis not present

## 2017-07-20 DIAGNOSIS — J45909 Unspecified asthma, uncomplicated: Secondary | ICD-10-CM | POA: Diagnosis not present

## 2017-07-20 DIAGNOSIS — M2012 Hallux valgus (acquired), left foot: Secondary | ICD-10-CM | POA: Diagnosis not present

## 2017-07-20 MED ORDER — PROMETHAZINE HCL 25 MG PO TABS: 25.0000 mg | ORAL_TABLET | Freq: Three times a day (TID) | ORAL | 0 refills | Status: DC | PRN

## 2017-07-20 MED ORDER — OXYCODONE-ACETAMINOPHEN 5-325 MG PO TABS: 1.0000 | ORAL_TABLET | ORAL | 0 refills | Status: DC | PRN

## 2017-07-20 MED ORDER — CEPHALEXIN 500 MG PO CAPS: 500.0000 mg | ORAL_CAPSULE | Freq: Three times a day (TID) | ORAL | 0 refills | Status: DC

## 2017-07-20 NOTE — Progress Notes (Signed)
dme-o

## 2017-07-20 NOTE — Progress Notes (Signed)
Pre-operative Note  Patient [facility] today for surgical intervention of the LEFT foot for painful bunion and mallet toe deformity. The surgical consent was reviewed with the patient and we discussed the procedure as well as the postoperative course. I again discussed all alternatives, risks, complications. I answered all of their questions to the best of my ability and they wish to proceed with surgery. No promises or guarantees were given as to the outcome of the surgery.   The surgical consent was signed.   Patient is NPO since midnight.  Post-op medications sent to her pharmacy.   The patient does not have have a history of blood clots or bleeding disorders.   No further questions. Mom was also at bedside today who has no further questions.   Ovid CurdMatthew Maribelle Hopple, DPM [facility]

## 2017-07-21 ENCOUNTER — Telehealth: Payer: Self-pay | Admitting: *Deleted

## 2017-07-21 DIAGNOSIS — Z9889 Other specified postprocedural states: Secondary | ICD-10-CM

## 2017-07-21 MED ORDER — OXYCODONE-ACETAMINOPHEN 10-325 MG PO TABS: 1.0000 | ORAL_TABLET | ORAL | 0 refills | Status: DC | PRN

## 2017-07-21 NOTE — Telephone Encounter (Signed)
Pt's mtr, [name] states pt had surgery yesterday and did fine, but the pain medication is not covering the pain, current level on 1-10 is a 7, with dull heart beat sensation.[name] states she spoke with Dr. Ardelle AntonWagoner and can take 2 percocet 5mg , if have to take 2 percocets more than 1-2 times call office and will increase to Percocet 10mg . I told [name] I would inform Dr. Samuella CotaPrice and have dosage changed. I told [name] I felt the surgical ace was not too tight, that after the surgery the ace is put on snuggly to staunch any post-op bleeding or decrease swelling, and often after it had done it's job it was too tight. I told [name] to remove the cam walker boot, open-ended sock, and the ace wrap only, don't touch the gauze, elevate the foot for 15 minutes, but if pain worsened dangle the foot for 15 minutes, this being the only time it was okay to dangle the foot, then after 15 minutes place foot level with hip and rewrap ace looser beginning at the toes and going up the leg, replace the sock and boot.

## 2017-07-21 NOTE — Telephone Encounter (Signed)
Called and spoke with the mom Ship broker([name]) and mom stated that the patient was doing better today and was nausea one time yesterday and the nerve block was wearing off yesterday and did take some pain medicine and slept till for 4 am today and the pain scale was a 7 and was drinking ginger ale and mom stated that she wanted to see Dr Ardelle AntonWagoner and requested to move appointment and I stated to the mom to call the office with any concerns or questions. Misty StanleyLisa

## 2017-07-25 ENCOUNTER — Encounter: Payer: Self-pay | Admitting: Podiatry

## 2017-07-25 ENCOUNTER — Encounter: Payer: BLUE CROSS/BLUE SHIELD | Admitting: Podiatry

## 2017-07-25 NOTE — Progress Notes (Signed)
DOS 07/20/17 Lt foot lapidus bunionectomy and hammertoe repair of 2nd-3rd toes

## 2017-07-26 ENCOUNTER — Encounter (INDEPENDENT_AMBULATORY_CARE_PROVIDER_SITE_OTHER): Payer: Self-pay

## 2017-07-26 ENCOUNTER — Ambulatory Visit (INDEPENDENT_AMBULATORY_CARE_PROVIDER_SITE_OTHER): Payer: BLUE CROSS/BLUE SHIELD

## 2017-07-26 ENCOUNTER — Telehealth: Payer: Self-pay | Admitting: *Deleted

## 2017-07-26 ENCOUNTER — Ambulatory Visit (INDEPENDENT_AMBULATORY_CARE_PROVIDER_SITE_OTHER): Payer: BLUE CROSS/BLUE SHIELD | Admitting: Podiatry

## 2017-07-26 ENCOUNTER — Encounter: Payer: Self-pay | Admitting: Podiatry

## 2017-07-26 VITALS — BP 102/63 | HR 67 | Resp 18

## 2017-07-26 DIAGNOSIS — Z9889 Other specified postprocedural states: Secondary | ICD-10-CM

## 2017-07-26 DIAGNOSIS — M205X2 Other deformities of toe(s) (acquired), left foot: Secondary | ICD-10-CM | POA: Diagnosis not present

## 2017-07-26 DIAGNOSIS — M21619 Bunion of unspecified foot: Secondary | ICD-10-CM

## 2017-07-26 NOTE — Telephone Encounter (Signed)
I spoke with pt's mtr, [name] and told her both Dr. Ardelle AntonWagoner and I had sent out orders for the knee scooter and I gave [facility] [phone]. Sent community message to A. Baron Hamperrotter - [facility] to be on the alert for pt's call.

## 2017-07-26 NOTE — Telephone Encounter (Signed)
-----   Message from Ree Edmanachel A Komonski sent at 07/26/2017  1:48 PM EST ----- Regarding: knee scooter Patient was checking out today after seeing Dr. Ardelle AntonWagoner and was wanting to know the status of her knee scooter. The mom states that she has not heard anything back from this. Please advise

## 2017-07-27 ENCOUNTER — Telehealth: Payer: Self-pay | Admitting: Podiatry

## 2017-07-27 NOTE — Telephone Encounter (Signed)
Pt's mtr, [name] states pt has complained the heel of the surgery foot has hurt, ached on and off all night. I told [name] to check for capillary refill at the heel, change to a loose fitting athletic sock to allow for easy changing when damp due to perspiration and at this point to elevate as needed for swelling and comfort, still not a lot of dangling. I asked [name] if pt was instructed to remain totally non-weight bearing and she said yes. I told her to make sure the boot was applied comfortably also. I told her I ask Dr. Ardelle AntonWagoner if there was any modification of the weight bearing status.

## 2017-07-27 NOTE — Telephone Encounter (Signed)
Left message informing pt' mtr, [name] of Dr. Gabriel RungWagoner's orders and that if she would like to bring pt in today at 3:45pm to call so I could schedule.

## 2017-07-27 NOTE — Progress Notes (Addendum)
Subjective: Tabitha Hawkins is a 22 y.o. is seen today in office s/p LEFT foot Lapidus bunionectomy and mallet toe repair of digits 2 and 3 preformed on 07/20/2017. They state their pain is improving and she states that over the last 2 days she has been doing well.  She has been nonweightbearing and keeping the foot elevated as well as ice to the area.  Denies any systemic complaints such as fevers, chills, nausea, vomiting. No calf pain, chest pain, shortness of breath.   Objective: General: No acute distress, AAOx3  DP/PT pulses palpable 2/4, CRT < 3 sec to all digits.  Protective sensation intact. Motor function intact.  LEFT foot: Incision is well coapted without any evidence of dehiscence and sutures are intact.  K wire intact the second and third toe without any drainage or signs of infection. There is no surrounding erythema, ascending cellulitis, fluctuance, crepitus, malodor, drainage/purulence. There is mild edema around the surgical site. There is mild pain along the surgical site.  No other areas of tenderness to bilateral lower extremities.  No other open lesions or pre-ulcerative lesions.  No pain with calf compression, swelling, warmth, erythema.   Assessment and Plan:  Status post left foot surgery, doing well with no complications   -Treatment options discussed including all alternatives, risks, and complications -X-rays were obtained and reviewed with the patient.  Hardware intact status post Lapidus bunionectomy and hammertoe repair.  No acute fracture identified. -Antibiotic ointment was applied over the incisions followed by dry sterile dressing.  Keep the dressing clean, dry, intact -Nonweightbearing and must wear cam boot at all times  -Ice/elevation -Pain medication as needed. -After looking in her foot today she is very pleased with the position of her toes and she and her mother have no questions or concerns.  -Monitor for any clinical signs or symptoms of infection and  DVT/PE and directed to call the office immediately should any occur or go to the ER. -Follow-up in 1 week or sooner if any problems arise. In the meantime, encouraged to call the office with any questions, concerns, change in symptoms.   Ovid CurdMatthew Leigh Blas, DPM

## 2017-07-27 NOTE — Telephone Encounter (Signed)
I'm calling for my daughter who had surgery by Dr. Ardelle AntonWagoner on 30 January. She saw Dr. Ardelle AntonWagoner yesterday and he said everything was looking great. We came home yesterday evening and her heel was hurting which was not any part of the surgery. We loosened the ace bandage a little bit and that helped relive the pain a little. However, it has hurt off and on all night. Initially when we removed the ace bandage the heel looked like it was kind of puckered like it had been in the water too long. The ace wrap and the white bandage were completely dry. Now she is having pain and aching in the heel of her foot. If you could please call me back at [phone]. Thank you and have a good day.

## 2017-07-27 NOTE — Telephone Encounter (Signed)
Error

## 2017-07-27 NOTE — Telephone Encounter (Signed)
She needs to be NWB. She can loosen the bandage to make sure it is not too tight. If she wants to come in to have it re-wrapped that is fine as well. It sounds like pressure. Need to make sure the boot is not putting pressure there to the heel as well.

## 2017-08-01 ENCOUNTER — Ambulatory Visit (INDEPENDENT_AMBULATORY_CARE_PROVIDER_SITE_OTHER): Payer: BLUE CROSS/BLUE SHIELD | Admitting: Podiatry

## 2017-08-01 ENCOUNTER — Encounter: Payer: Self-pay | Admitting: Podiatry

## 2017-08-01 ENCOUNTER — Other Ambulatory Visit: Payer: Self-pay | Admitting: Internal Medicine

## 2017-08-01 DIAGNOSIS — M205X2 Other deformities of toe(s) (acquired), left foot: Secondary | ICD-10-CM

## 2017-08-01 DIAGNOSIS — M21619 Bunion of unspecified foot: Secondary | ICD-10-CM

## 2017-08-09 NOTE — Progress Notes (Signed)
Subjective: Tabitha Hawkins is a 22 y.o. is seen today in office s/p LEFT foot Lapidus bunionectomy and mallet toe repair of digits 2 and 3 preformed on 07/20/2017.  She states that she is doing well she has not been taking pain medicine over the last couple of days.  She has been nonweightbearing in the cam boot.  States that she is very happy that she is no longer having pain that she is experiencing prior to surgery so far.  She is been tracking the foot ice and elevate as much as possible.  She has no other concerns today. Denies any systemic complaints such as fevers, chills, nausea, vomiting. No calf pain, chest pain, shortness of breath.   Objective: General: No acute distress, AAOx3  DP/PT pulses palpable 2/4, CRT < 3 sec to all digits.  Protective sensation intact. Motor function intact.  LEFT foot: Incision is well coapted without any evidence of dehiscence and sutures are intact.  K wire intact the second and third toe without any drainage or signs of infection.  Toes are in rectus position.  There is no significant discomfort in the surgical sites.  There is trace edema and mild ecchymosis but there is no surrounding erythema, ascending cellulitis.  No fluctuation or crepitation.  No drainage or pus and no malodor.  No clinical signs of infection.   No other areas of tenderness to bilateral lower extremities.  No other open lesions or pre-ulcerative lesions.  No pain with calf compression, swelling, warmth, erythema.       Assessment and Plan:  Status post left foot surgery, doing well with no complications   -Treatment options discussed including all alternatives, risks, and complications -Incision appears to be healing well.  All of the sutures intact today.  Antibiotic ointment was applied followed by a bandage.  The dressing clean, dry, intact. -Nonweightbearing and must wear cam boot at all times  -Ice/elevation -Pain medication as needed. -Monitor for any clinical signs or  symptoms of infection and DVT/PE and directed to call the office immediately should any occur or go to the ER. -Follow-up in 1 week for suture removal or sooner if any problems arise. In the meantime, encouraged to call the office with any questions, concerns, change in symptoms.   Ovid CurdMatthew Jovie Swanner, DPM

## 2017-08-11 ENCOUNTER — Ambulatory Visit (INDEPENDENT_AMBULATORY_CARE_PROVIDER_SITE_OTHER): Payer: BLUE CROSS/BLUE SHIELD

## 2017-08-11 ENCOUNTER — Encounter: Payer: Self-pay | Admitting: Podiatry

## 2017-08-11 ENCOUNTER — Ambulatory Visit (INDEPENDENT_AMBULATORY_CARE_PROVIDER_SITE_OTHER): Payer: BLUE CROSS/BLUE SHIELD | Admitting: Podiatry

## 2017-08-11 DIAGNOSIS — M205X2 Other deformities of toe(s) (acquired), left foot: Secondary | ICD-10-CM

## 2017-08-11 DIAGNOSIS — Z9889 Other specified postprocedural states: Secondary | ICD-10-CM

## 2017-08-11 DIAGNOSIS — M21619 Bunion of unspecified foot: Secondary | ICD-10-CM

## 2017-08-11 NOTE — Progress Notes (Signed)
Subjective: Tabitha Hawkins is a 22 y.o. is seen today in office s/p LEFT foot Lapidus bunionectomy and mallet toe repair of digits 2 and 3 preformed on 07/20/2017.  She states that overall she is doing well and she denies being any pain.  The incisions will itch occasionally.  She is not taking any pain medication.  She presents today for suture removal.  She states the wires in the toes keep turning.  She denies any recent injury or trauma she is been trying to keep the foot elevated as much as possible and been nonweightbearing. She has no other concerns today. Denies any systemic complaints such as fevers, chills, nausea, vomiting. No calf pain, chest pain, shortness of breath.   Objective: General: No acute distress, AAOx3  DP/PT pulses palpable 2/4, CRT < 3 sec to all digits.  Protective sensation intact. Motor function intact.  LEFT foot: Incision is well coapted without any evidence of dehiscence and sutures are intact.  K wire intact the second and third toe without any drainage or signs of infection.  Toes are in rectus position.  The third digit has a slight hammertoe contracture at the PIPJ.  There is no significant discomfort in the surgical sites.  There is trace edema and mild ecchymosis but there is no surrounding erythema, ascending cellulitis.  No fluctuation or crepitation.  No drainage or pus and no malodor.  No clinical signs of infection.   No other areas of tenderness to bilateral lower extremities.  No other open lesions or pre-ulcerative lesions.  No pain with calf compression, swelling, warmth, erythema.         Assessment and Plan:  Status post left foot surgery, doing well with no complications   -Treatment options discussed including all alternatives, risks, and complications -X-rays obtained and reviewed.  Hardware intact the first metatarsal cuneiform joint without any breakage.  Increased consolidation across the arthrodesis site.  K wire intact the second third  toes however the third toe appears to be somewhat contracted. -I did remove the K wires to the second third toes today as they keep moving and to hopefully help the remainder the toes remain in a rectus position.  They were removed in total after the area was prepped with Betadine.  Antibiotic ointment was applied followed by a bandage. -Incision appears to be healing well.  All of the sutures intact today.  Sutures were also removed.  Steri-Strips were applied for reinforcement.  Antibiotic ointment was applied followed by a bandage.  The dressing clean, dry, intact. -Continue nonweightbearing in the cam boot. -Ice/elevation -Pain medication as needed. -She has had to be partial weightbearing about 1 week. Remain in CAM boot at all times.  -Monitor for any clinical signs or symptoms of infection and DVT/PE and directed to call the office immediately should any occur or go to the ER. -Follow-up in 2 weeks or sooner if any problems arise. In the meantime, encouraged to call the office with any questions, concerns, change in symptoms.   Ovid CurdMatthew Jeanae Whitmill, DPM

## 2017-08-25 ENCOUNTER — Encounter: Payer: Self-pay | Admitting: Podiatry

## 2017-08-25 ENCOUNTER — Ambulatory Visit (INDEPENDENT_AMBULATORY_CARE_PROVIDER_SITE_OTHER): Payer: BLUE CROSS/BLUE SHIELD

## 2017-08-25 ENCOUNTER — Ambulatory Visit (INDEPENDENT_AMBULATORY_CARE_PROVIDER_SITE_OTHER): Payer: BLUE CROSS/BLUE SHIELD | Admitting: Podiatry

## 2017-08-25 VITALS — BP 103/63 | HR 71 | Resp 16

## 2017-08-25 DIAGNOSIS — M205X2 Other deformities of toe(s) (acquired), left foot: Secondary | ICD-10-CM | POA: Diagnosis not present

## 2017-08-25 DIAGNOSIS — Z9889 Other specified postprocedural states: Secondary | ICD-10-CM

## 2017-08-25 DIAGNOSIS — M21612 Bunion of left foot: Secondary | ICD-10-CM

## 2017-08-26 ENCOUNTER — Other Ambulatory Visit: Payer: Self-pay | Admitting: Internal Medicine

## 2017-08-26 MED ORDER — FLUOXETINE HCL 20 MG PO TABS: 20.0000 mg | ORAL_TABLET | Freq: Every day | ORAL | 0 refills | Status: DC

## 2017-08-28 NOTE — Progress Notes (Signed)
Subjective: Tabitha Hawkins is a 22 y.o. is seen today in office s/p LEFT foot Lapidus bunionectomy and mallet toe repair of digits 2 and 3 preformed on 07/20/2017.  She has been walking in the Cam boot over the last week and she states that she is very happy and she is having no pain and she has not had significant medication.  She says the toe itself is doing very well having no pain at the surgical site.  She says that she can feel a bump on the arch of her foot hyper almost feels good blood pressure to the area.  She reports no recent injury or trauma she has no other complaints. Denies any systemic complaints such as fevers, chills, nausea, vomiting. No calf pain, chest pain, shortness of breath.   Objective: General: No acute distress, AAOx3  DP/PT pulses palpable 2/4, CRT < 3 sec to all digits.  Protective sensation intact. Motor function intact.  LEFT foot: Incision is well coapted without any evidence of dehiscence and eschar is formed.  Second and third toes are in rectus position.  There is minimal edema to the surgical site there is no swelling erythema or increasing warmth.  There is no clinical signs of infection noted.  Toes are all in rectus position.  Mild discomfort with MPJ range of motion however there is adequate range of motion of the first MTPJ.  No significant discomfort to palpation of the surgical sites.  No other areas of tenderness of the left foot. Assessment and Plan:  Status post left foot surgery, doing well with no complications   -Treatment options discussed including all alternatives, risks, and complications -X-rays were obtained and reviewed.  Hardware intact without any breakage or any evidence of acute fracture.  Still radiolucent line on the arthrodesis site but there is no clinical pain or symptoms of the area. -At this point I want her to continue weightbearing as tolerated in the cam boot over the next 2 weeks.  We will start some physical therapy and a  prescription for [facility] was written today.  We will plan to start this in the next couple of days.  In the next 2 weeks as he starts to feel better she can start to gradually transition to regular shoe.  Discussed gradual transition to shoe.  Continue to ice and elevate. -Follow-up in 3 weeks or sooner if needed.  Call if questions or concerns.  *X-rays next appointment  Vivi BarrackMatthew R Macguire Holsinger DPM

## 2017-08-30 DIAGNOSIS — M25675 Stiffness of left foot, not elsewhere classified: Secondary | ICD-10-CM | POA: Diagnosis not present

## 2017-08-30 DIAGNOSIS — M6281 Muscle weakness (generalized): Secondary | ICD-10-CM | POA: Diagnosis not present

## 2017-08-30 DIAGNOSIS — R269 Unspecified abnormalities of gait and mobility: Secondary | ICD-10-CM | POA: Diagnosis not present

## 2017-08-30 DIAGNOSIS — M79675 Pain in left toe(s): Secondary | ICD-10-CM | POA: Diagnosis not present

## 2017-09-01 DIAGNOSIS — M25675 Stiffness of left foot, not elsewhere classified: Secondary | ICD-10-CM | POA: Diagnosis not present

## 2017-09-01 DIAGNOSIS — R269 Unspecified abnormalities of gait and mobility: Secondary | ICD-10-CM | POA: Diagnosis not present

## 2017-09-01 DIAGNOSIS — M6281 Muscle weakness (generalized): Secondary | ICD-10-CM | POA: Diagnosis not present

## 2017-09-01 DIAGNOSIS — M79675 Pain in left toe(s): Secondary | ICD-10-CM | POA: Diagnosis not present

## 2017-09-02 ENCOUNTER — Encounter: Payer: Self-pay | Admitting: Internal Medicine

## 2017-09-02 ENCOUNTER — Ambulatory Visit (INDEPENDENT_AMBULATORY_CARE_PROVIDER_SITE_OTHER): Payer: BLUE CROSS/BLUE SHIELD | Admitting: Internal Medicine

## 2017-09-02 VITALS — BP 116/74 | HR 76 | Temp 98.4°F | Resp 14 | Ht 70.0 in | Wt 148.1 lb

## 2017-09-02 DIAGNOSIS — F329 Major depressive disorder, single episode, unspecified: Secondary | ICD-10-CM | POA: Diagnosis not present

## 2017-09-02 DIAGNOSIS — F419 Anxiety disorder, unspecified: Secondary | ICD-10-CM | POA: Diagnosis not present

## 2017-09-02 DIAGNOSIS — F32A Depression, unspecified: Secondary | ICD-10-CM

## 2017-09-02 MED ORDER — FLUOXETINE HCL 20 MG PO TABS: 20.0000 mg | ORAL_TABLET | Freq: Every day | ORAL | 6 refills | Status: DC

## 2017-09-02 NOTE — Patient Instructions (Signed)
   GO TO THE FRONT DESK Schedule your next appointment for a  Check up in 6 months  

## 2017-09-02 NOTE — Progress Notes (Signed)
Subjective:    Patient ID: Tabitha Hawkins, female    DOB: 06/09/1996, 22 y.o.   MRN: [id number]  DOS:  09/02/2017 Type of visit - description : Follow-up, here with her mother Interval history: Started fluoxetine, good results, previous symptoms such as lack of interest, crying, frustration are much improved. Also, she stopped smoking marijuana.  Wt Readings from Last 3 Encounters:  09/02/17 148 lb 2 oz (67.2 kg)  06/01/17 150 lb (68 kg)  02/09/17 147 lb 4 oz (66.8 kg)    Review of Systems No nausea, vomiting, diarrhea. Appetite is very good. No suicidal ideas No excessive sedation   Past Medical History:  Diagnosis Date  . ADHD (attention deficit hyperactivity disorder)   . H/O multiple concussions    seen by neuro  . Headache    frequent  . Short-term memory loss    cause from multiple concussions?    Past Surgical History:  Procedure Laterality Date  . KNEE ARTHROSCOPY Left 2016    Social History   Socioeconomic History  . Marital status: Single    Spouse name: Not on file  . Number of children: 0  . Years of education: Not on file  . Highest education level: Not on file  Social Needs  . Financial resource strain: Not on file  . Food insecurity - worry: Not on file  . Food insecurity - inability: Not on file  . Transportation needs - medical: Not on file  . Transportation needs - non-medical: Not on file  Occupational History  . Occupation: waitress   Tobacco Use  . Smoking status: Never Smoker  . Smokeless tobacco: Never Used  Substance and Sexual Activity  . Alcohol use: No  . Drug use: No  . Sexual activity: Yes    Birth control/protection: Implant  Other Topics Concern  . Not on file  Social History Narrative   Household-- father and mother    [place]pped [employer]t a [place], couldn't play volley-ball 05-2015          Allergies as of 09/02/2017   No Known Allergies     Medication List        Accurate as of  09/02/17 11:59 PM. Always use your most recent med list.          albuterol 108 (90 Base) MCG/ACT inhaler Commonly known as:  VENTOLIN HFA Inhale 2 puffs into the lungs every 6 (six) hours as needed for wheezing or shortness of breath.   budesonide-formoterol 160-4.5 MCG/ACT inhaler Commonly known as:  SYMBICORT Inhale 2 puffs into the lungs 2 (two) times daily.   etonogestrel 68 MG Impl implant Commonly known as:  NEXPLANON 1 each by Subdermal route once.   FLUoxetine 20 MG tablet Commonly known as:  PROZAC Take 1 tablet (20 mg total) by mouth daily.   oxyCODONE-acetaminophen 10-325 MG tablet Commonly known as:  PERCOCET Take 1 tablet by mouth every 4 (four) hours as needed for pain.          Objective:   Physical Exam BP 116/74 (BP Location: Left Arm, Patient Position: Sitting, Cuff Size: Small)   Pulse 76   Temp 98.4 F (36.9 C) (Oral)   Resp 14   Ht 5\' 10"  (1.778 m)   Wt 148 lb 2 oz (67.2 kg)   SpO2 97%   BMI 21.25 kg/m  General:   Well developed, well nourished . NAD.  HEENT:  Normocephalic . Face symmetric, atraumatic Skin: Not pale.  Not jaundice Neurologic:  alert & oriented X3.  Speech normal, gait limited due to using a Cam boot Psych--  Cognition and judgment appear intact.  Cooperative with normal attention span and concentration.  Behavior appropriate. No anxious or depressed appearing.      Assessment & Plan:    Assessment Asthma ADD- Saw a psychologist 2015, DX ADHD, inattentive type. Rx other of her first time 08-2013 Postconcussion syndrome: DX 05-2015, saw neuro- d/c college sports  Neuropsychological evaluation 08/13/2015 referred by neurology, report reviewed, dx  ADD with hyperactivity; + sx of  postconcussion including headaches. Anxiety-depression  PLAN: Anxiety, depression: Since the last visit he started fluoxetine, no s/e, no manic episode.  She is much improved, "I feel happy again". 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 Has not seen her counselor as frequently. Things are going in the right direction, refill medications, RTC 6 months.

## 2017-09-02 NOTE — Progress Notes (Signed)
Pre visit review using our clinic review tool, if applicable. No additional management support is needed unless otherwise documented below in the visit note. 

## 2017-09-04 NOTE — Assessment & Plan Note (Signed)
Anxiety, depression: Since the last visit he started fluoxetine, no s/e, no manic episode.  She is much improved, "I feel happy again". 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 Has not seen her counselor as frequently. Things are going in the right direction, refill medications, RTC 6 months.

## 2017-09-07 DIAGNOSIS — M79675 Pain in left toe(s): Secondary | ICD-10-CM | POA: Diagnosis not present

## 2017-09-07 DIAGNOSIS — R269 Unspecified abnormalities of gait and mobility: Secondary | ICD-10-CM | POA: Diagnosis not present

## 2017-09-07 DIAGNOSIS — M25675 Stiffness of left foot, not elsewhere classified: Secondary | ICD-10-CM | POA: Diagnosis not present

## 2017-09-07 DIAGNOSIS — M6281 Muscle weakness (generalized): Secondary | ICD-10-CM | POA: Diagnosis not present

## 2017-09-08 DIAGNOSIS — M6281 Muscle weakness (generalized): Secondary | ICD-10-CM | POA: Diagnosis not present

## 2017-09-08 DIAGNOSIS — M79675 Pain in left toe(s): Secondary | ICD-10-CM | POA: Diagnosis not present

## 2017-09-08 DIAGNOSIS — R269 Unspecified abnormalities of gait and mobility: Secondary | ICD-10-CM | POA: Diagnosis not present

## 2017-09-08 DIAGNOSIS — M25675 Stiffness of left foot, not elsewhere classified: Secondary | ICD-10-CM | POA: Diagnosis not present

## 2017-09-13 DIAGNOSIS — M6281 Muscle weakness (generalized): Secondary | ICD-10-CM | POA: Diagnosis not present

## 2017-09-13 DIAGNOSIS — R269 Unspecified abnormalities of gait and mobility: Secondary | ICD-10-CM | POA: Diagnosis not present

## 2017-09-13 DIAGNOSIS — M25675 Stiffness of left foot, not elsewhere classified: Secondary | ICD-10-CM | POA: Diagnosis not present

## 2017-09-13 DIAGNOSIS — M79675 Pain in left toe(s): Secondary | ICD-10-CM | POA: Diagnosis not present

## 2017-09-15 ENCOUNTER — Ambulatory Visit (INDEPENDENT_AMBULATORY_CARE_PROVIDER_SITE_OTHER): Payer: BLUE CROSS/BLUE SHIELD | Admitting: Podiatry

## 2017-09-15 ENCOUNTER — Encounter: Payer: Self-pay | Admitting: Podiatry

## 2017-09-15 ENCOUNTER — Ambulatory Visit (INDEPENDENT_AMBULATORY_CARE_PROVIDER_SITE_OTHER): Payer: BLUE CROSS/BLUE SHIELD

## 2017-09-15 DIAGNOSIS — Z9889 Other specified postprocedural states: Secondary | ICD-10-CM

## 2017-09-15 DIAGNOSIS — R269 Unspecified abnormalities of gait and mobility: Secondary | ICD-10-CM | POA: Diagnosis not present

## 2017-09-15 DIAGNOSIS — M21612 Bunion of left foot: Secondary | ICD-10-CM

## 2017-09-15 DIAGNOSIS — M205X2 Other deformities of toe(s) (acquired), left foot: Secondary | ICD-10-CM

## 2017-09-15 DIAGNOSIS — M6281 Muscle weakness (generalized): Secondary | ICD-10-CM | POA: Diagnosis not present

## 2017-09-15 DIAGNOSIS — M25675 Stiffness of left foot, not elsewhere classified: Secondary | ICD-10-CM | POA: Diagnosis not present

## 2017-09-15 DIAGNOSIS — M79675 Pain in left toe(s): Secondary | ICD-10-CM | POA: Diagnosis not present

## 2017-09-15 NOTE — Progress Notes (Signed)
Subjective: Tabitha Hawkins is a 22 y.o. is seen today in office s/p LEFT foot Lapidus bunionectomy and mallet toe repair of digits 2 and 3 preformed on 07/20/2017.  She states that she is doing well.  She presents today wearing a regular shoe.  She has been trying to become more active.  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  She did try to go without the boot this coming week at work.  She denies any recent injury or trauma she states that she has no complaints of the surgery and she is doing very well.  She has no pain. She has no other concerns today. Denies any systemic complaints such as fevers, chills, nausea, vomiting. No calf pain, chest pain, shortness of breath.   Objective: General: No acute distress, AAOx3  DP/PT pulses palpable 2/4, CRT < 3 sec to all digits.  Protective sensation intact. Motor function intact.  LEFT foot: Incision is well coapted without any evidence of dehiscence and eschar is formed.  There is no pain or crepitation with MPJ range of motion.  There is no tenderness palpation of the surgical site.  There is trace edema there is no erythema or increase in warmth.  Incision is well-healed without any signs of infection. Toes are in a rectus position.  No other areas of tenderness of the left foot.  Assessment and Plan:  Status post left foot surgery, doing well with no complications   -Treatment options discussed including all alternatives, risks, and complications -X-rays were obtained and reviewed.  Hardware intact the first metatarsal cuneiform joint with increased consolidation across the arthrodesis site.  Fixation appears to be stable.  No evidence of acute fracture identified today. -Discussed with her gradually to increase her activity level and he can start to wear shoe at work and she is able to do this.  The gradual transition with this.  Continue rehab exercises as  well as ice to the area daily.  Continue with compression if needed for swelling.  Follow-up in 3 weeks.  At that point she is doing well we will consider surgery for the contralateral lower extremity.  Vivi BarrackMatthew R Wagoner DPM

## 2017-09-20 DIAGNOSIS — R269 Unspecified abnormalities of gait and mobility: Secondary | ICD-10-CM | POA: Diagnosis not present

## 2017-09-20 DIAGNOSIS — M6281 Muscle weakness (generalized): Secondary | ICD-10-CM | POA: Diagnosis not present

## 2017-09-20 DIAGNOSIS — M79675 Pain in left toe(s): Secondary | ICD-10-CM | POA: Diagnosis not present

## 2017-09-20 DIAGNOSIS — M25675 Stiffness of left foot, not elsewhere classified: Secondary | ICD-10-CM | POA: Diagnosis not present

## 2017-09-22 DIAGNOSIS — M6281 Muscle weakness (generalized): Secondary | ICD-10-CM | POA: Diagnosis not present

## 2017-09-22 DIAGNOSIS — M79675 Pain in left toe(s): Secondary | ICD-10-CM | POA: Diagnosis not present

## 2017-09-22 DIAGNOSIS — M25675 Stiffness of left foot, not elsewhere classified: Secondary | ICD-10-CM | POA: Diagnosis not present

## 2017-09-22 DIAGNOSIS — R269 Unspecified abnormalities of gait and mobility: Secondary | ICD-10-CM | POA: Diagnosis not present

## 2017-09-27 DIAGNOSIS — M79675 Pain in left toe(s): Secondary | ICD-10-CM | POA: Diagnosis not present

## 2017-09-27 DIAGNOSIS — M25675 Stiffness of left foot, not elsewhere classified: Secondary | ICD-10-CM | POA: Diagnosis not present

## 2017-09-27 DIAGNOSIS — M6281 Muscle weakness (generalized): Secondary | ICD-10-CM | POA: Diagnosis not present

## 2017-09-27 DIAGNOSIS — R269 Unspecified abnormalities of gait and mobility: Secondary | ICD-10-CM | POA: Diagnosis not present

## 2017-09-29 DIAGNOSIS — M79675 Pain in left toe(s): Secondary | ICD-10-CM | POA: Diagnosis not present

## 2017-09-29 DIAGNOSIS — M25675 Stiffness of left foot, not elsewhere classified: Secondary | ICD-10-CM | POA: Diagnosis not present

## 2017-09-29 DIAGNOSIS — R269 Unspecified abnormalities of gait and mobility: Secondary | ICD-10-CM | POA: Diagnosis not present

## 2017-09-29 DIAGNOSIS — M6281 Muscle weakness (generalized): Secondary | ICD-10-CM | POA: Diagnosis not present

## 2017-10-04 ENCOUNTER — Ambulatory Visit (INDEPENDENT_AMBULATORY_CARE_PROVIDER_SITE_OTHER): Payer: BLUE CROSS/BLUE SHIELD | Admitting: Podiatry

## 2017-10-04 ENCOUNTER — Ambulatory Visit (INDEPENDENT_AMBULATORY_CARE_PROVIDER_SITE_OTHER): Payer: BLUE CROSS/BLUE SHIELD

## 2017-10-04 DIAGNOSIS — M205X2 Other deformities of toe(s) (acquired), left foot: Secondary | ICD-10-CM | POA: Diagnosis not present

## 2017-10-04 DIAGNOSIS — M21619 Bunion of unspecified foot: Secondary | ICD-10-CM

## 2017-10-04 NOTE — Patient Instructions (Signed)

## 2017-10-05 NOTE — Progress Notes (Addendum)
Subjective: 22 year old female presents the office today with her mom for follow-up evaluation of left foot Lapidus bunionectomy with toe repair the second third toes.  She is been wearing a regular shoe but she states that she is doing well she is also been released by physical therapy.  She is having no issues with the left foot having no pain.  She is going to proceed with surgery to have the right foot done.  She continues to have pain on the right foot on a daily basis.  She had a good outcome the left foot showed 11% of the right foot. Denies any systemic complaints such as fevers, chills, nausea, vomiting. No acute changes since last appointment, and no other complaints at this time.   Objective: AAO x3, NAD DP/PT pulses palpable bilaterally, CRT less than 3 seconds LEFT FOOT: Incisions are well coapted without any evidence of dehiscence and there is minimal edema.  There is no erythema or increase in warmth.  No pain or restriction or crepitation with MPJ range of motion. No tenderness palpation surgical sites.  No open lesions or pre-ulcerative lesions.  RIGHT FOOT: Moderate to severe HAV is present on the right foot there is no pain or crepitation with first MPJ range of motion.  There is hypermobility present of the first ray.  Mallet toe contractures present the second and third toes.  There is tenderness palpation to the hallux, second, third toes.  Mild irritation to the dorsal DIPJ as well as the medial bunionectomy irritation set up shoes. No pain with calf compression, swelling, warmth, erythema  Assessment: Healed surgical site left foot with right foot symptomatic HAV, mallet toe deformities  Plan: -All treatment options discussed with the patient including all alternatives, risks, complications.  -X-rays were obtained and reviewed of the left foot.  Hardware intact.  Increased consolidation across the arthrodesis site.  No evidence of acute fracture or stress fracture. -In regard  to the left foot we discharged from postoperative care she is doing well.  Continue with supportive shoes and continue MPJ range of motion.  There is any issues she instructed to call the office.  She agrees with this plan is very happy. -On the right foot we discussed both conservative as well as surgical treatment options.  She wished to proceed with surgery.  Discussed a Lapidus bunionectomy with possible Akin osteotomy with mallet toe repair of the second and third toes.  We discussed the surgery as well as the postoperative course and this is not a guarantee resolution of symptoms.  There is mild met adductus present which will limit some correction but overall think she will get a good repair. -The incision placement as well as the postoperative course was discussed with the patient. I discussed risks of the surgery which include, but not limited to, infection, bleeding, pain, swelling, need for further surgery, delayed or nonhealing, painful or ugly scar, numbness or sensation changes, over/under correction, recurrence, transfer lesions, further deformity, hardware failure, DVT/PE, loss of toe/foot. Patient understands these risks and wishes to proceed with surgery. The surgical consent was reviewed with the patient all 3 pages were signed. No promises or guarantees were given to the outcome of the procedure. All questions were answered to the best of my ability. Before the surgery the patient was encouraged to call the office if there is any further questions. The surgery will be performed at the [facility] on an outpatient basis. -Patient encouraged to call the office with any questions, concerns,  change in symptoms.   Trula Slade DPM

## 2017-10-26 ENCOUNTER — Other Ambulatory Visit: Payer: Self-pay | Admitting: Podiatry

## 2017-10-26 ENCOUNTER — Encounter: Payer: Self-pay | Admitting: Podiatry

## 2017-10-26 DIAGNOSIS — M2041 Other hammer toe(s) (acquired), right foot: Secondary | ICD-10-CM | POA: Diagnosis not present

## 2017-10-26 DIAGNOSIS — M25571 Pain in right ankle and joints of right foot: Secondary | ICD-10-CM | POA: Diagnosis not present

## 2017-10-26 DIAGNOSIS — M21611 Bunion of right foot: Secondary | ICD-10-CM | POA: Diagnosis not present

## 2017-10-26 DIAGNOSIS — M2011 Hallux valgus (acquired), right foot: Secondary | ICD-10-CM | POA: Diagnosis not present

## 2017-10-26 DIAGNOSIS — M79671 Pain in right foot: Secondary | ICD-10-CM | POA: Diagnosis not present

## 2017-10-26 DIAGNOSIS — J45909 Unspecified asthma, uncomplicated: Secondary | ICD-10-CM | POA: Diagnosis not present

## 2017-10-26 MED ORDER — PROMETHAZINE HCL 25 MG PO TABS
25.0000 mg | ORAL_TABLET | Freq: Three times a day (TID) | ORAL | 0 refills | Status: DC | PRN
Start: 2017-10-26 — End: 2018-05-11

## 2017-10-26 MED ORDER — OXYCODONE-ACETAMINOPHEN 10-325 MG PO TABS: 1.0000 | ORAL_TABLET | ORAL | 0 refills | Status: DC | PRN

## 2017-10-26 MED ORDER — CEPHALEXIN 500 MG PO CAPS: 500.0000 mg | ORAL_CAPSULE | Freq: Three times a day (TID) | ORAL | 0 refills | Status: DC

## 2017-10-26 MED ORDER — OXYCODONE-ACETAMINOPHEN 5-325 MG PO TABS
1.0000 | ORAL_TABLET | Freq: Four times a day (QID) | ORAL | 0 refills | Status: DC | PRN
Start: 2017-10-26 — End: 2017-10-26

## 2017-10-28 ENCOUNTER — Telehealth: Payer: Self-pay | Admitting: Podiatry

## 2017-10-28 ENCOUNTER — Other Ambulatory Visit: Payer: Self-pay | Admitting: Podiatry

## 2017-10-28 MED ORDER — OXYCODONE-ACETAMINOPHEN 10-325 MG PO TABS: 1.0000 | ORAL_TABLET | ORAL | 0 refills | Status: DC | PRN

## 2017-10-28 NOTE — Telephone Encounter (Signed)
Done

## 2017-10-28 NOTE — Telephone Encounter (Addendum)
Pt's mtr, Tabitha Hawkins states she spoke with Dr. Logan Bores and he ordered pt to take her percocet 5/325mg  2 tablets every 4 hours and  ibuprofen 3 times a day in between the percocet dosing, and is using the Zofran. Tabitha Hawkins states they did remove the ace wrap and pt has gotten relief, she feels the reason the discomfort was so acute is the leg block wore off in less than 24 hours, but they are doing better now. I told Tabitha Hawkins to be aware of the Percocet mg when picked up an the [facility] today and to take as directed and continue the rest, ice and elevation. I told Tabitha Hawkins, Dr. Ardelle Anton stated if necessary, he could rewrap the surgical dressing, Tabitha Hawkins states she feels they will be fine. Tabitha Hawkins states understanding.

## 2017-10-28 NOTE — Telephone Encounter (Signed)
Left message asking pt's mtr, [name] to let me know if pt is taking percocet 10/325mg  2 tablets every 5 hours, and to call for more comfort measures and clarify the pain medication she is taking.

## 2017-10-28 NOTE — Telephone Encounter (Signed)
This is Buyer, retail, Tiffini's mom. She had surgery with Dr. Ardelle Anton on Wednesday. I wanted to touch base because her nerve block wore off very quickly unfortunately. By evening it was pretty much gone and Wednesday night was really tough as we almost thought we were going to have to take her to the emergency room. She is doing much better now. I spoke to Dr. Logan Bores I think it was about 3 am Thursday morning he changed her medicine to take every four hours. Dr. Ardelle Anton had said if we were going to need more medicine over the weekend or need the 10 mg instead of the 5 mg to call. She is currently taking 2 tablets about every 5 hours. If you could please give me a call at [phone] so we can figure out what she needs to do. Her pain level is at a constant 5 right now. If you would just give me a call and we can chat about it. Thank you.

## 2017-10-31 ENCOUNTER — Ambulatory Visit (INDEPENDENT_AMBULATORY_CARE_PROVIDER_SITE_OTHER): Payer: BLUE CROSS/BLUE SHIELD | Admitting: Podiatry

## 2017-10-31 ENCOUNTER — Encounter: Payer: Self-pay | Admitting: Podiatry

## 2017-10-31 ENCOUNTER — Ambulatory Visit (INDEPENDENT_AMBULATORY_CARE_PROVIDER_SITE_OTHER): Payer: BLUE CROSS/BLUE SHIELD

## 2017-10-31 VITALS — Temp 97.8°F

## 2017-10-31 DIAGNOSIS — M21619 Bunion of unspecified foot: Secondary | ICD-10-CM | POA: Diagnosis not present

## 2017-10-31 DIAGNOSIS — M205X1 Other deformities of toe(s) (acquired), right foot: Secondary | ICD-10-CM

## 2017-10-31 DIAGNOSIS — Z9889 Other specified postprocedural states: Secondary | ICD-10-CM

## 2017-11-01 NOTE — Progress Notes (Signed)
Subjective: Tabitha Hawkins is a 22 y.o. is seen today in office s/p right Lapidus bunionectomy and mallet toe repair of the 2nd and 3rd toes preformed on 10/26/2017.  She states her pain is much improved now.  She is not taking any pain medication the last 24 hours except for ibuprofen.  She is remained in the cam boot.  Also treated the pain after the surgery the bandage was loosened which also helped.  She is try to remain nonweightbearing as much as possible.  She denies any systemic complaints including fevers, chills, nausea, vomiting.  No calf pain, chest pain, shortness of breath.  Objective: General: No acute distress, AAOx3  DP/PT pulses palpable 2/4, CRT < 3 sec to all digits.  Protective sensation intact. Motor function intact.  Right foot: Incision is well coapted without any evidence of dehiscence and sutures are intact.  K wire intact the second third toes.  The toes are in rectus position.  She currently states that she is not having any discomfort.  There is no surrounding erythema, ascending cellulitis, fluctuance, crepitus, malodor, drainage/purulence. There is mild edema around the surgical site. There is minimal to no pain along the surgical site.  No other areas of tenderness to bilateral lower extremities.  No other open lesions or pre-ulcerative lesions.  No pain with calf compression, swelling, warmth, erythema.   Assessment and Plan:  Status post right foot surgery, doing well with no complications   -Treatment options discussed including all alternatives, risks, and complications -X-rays were obtained and reviewed.  Hardware intact.  No evidence of acute fracture identified at this time. -Antibiotic ointment was applied to the incisions followed by dry sterile dressing.  Keep the dressing clean, dry, intact. -Remain nonweightbearing in the cam boot at all times. -Ice/elevation -Pain medication as needed. -Monitor for any clinical signs or symptoms of infection and DVT/PE  and directed to call the office immediately should any occur or go to the ER. -Follow-up 10 days or sooner if any problems arise. In the meantime, encouraged to call the office with any questions, concerns, change in symptoms.   Ovid Curd, DPM

## 2017-11-07 ENCOUNTER — Encounter: Payer: BLUE CROSS/BLUE SHIELD | Admitting: Podiatry

## 2017-11-10 ENCOUNTER — Ambulatory Visit (INDEPENDENT_AMBULATORY_CARE_PROVIDER_SITE_OTHER): Payer: BLUE CROSS/BLUE SHIELD | Admitting: Podiatry

## 2017-11-10 ENCOUNTER — Encounter: Payer: Self-pay | Admitting: Podiatry

## 2017-11-10 ENCOUNTER — Telehealth: Payer: Self-pay | Admitting: Podiatry

## 2017-11-10 DIAGNOSIS — Z9889 Other specified postprocedural states: Secondary | ICD-10-CM

## 2017-11-10 DIAGNOSIS — M21619 Bunion of unspecified foot: Secondary | ICD-10-CM

## 2017-11-10 DIAGNOSIS — M205X1 Other deformities of toe(s) (acquired), right foot: Secondary | ICD-10-CM

## 2017-11-10 NOTE — Telephone Encounter (Signed)
She can come in today if she wants. Have her come in in at 430

## 2017-11-10 NOTE — Telephone Encounter (Signed)
Left message for pt's mtr, [name] to call to get pt in today with Dr. Ardelle Anton at 4:30pm.

## 2017-11-10 NOTE — Telephone Encounter (Signed)
I'm calling about my daughter Tabitha Hawkins. She took her ace bandage off the other day and noticed a blister next to her scar. It has since busted but the area does not seem to be infected. The main problem right now is that she is itching, and it is off the charts. She is scheduled to come in tomorrow at 11:15 am. Should she come in today or is it okay for her to wait until tomorrow? If she does wait until tomorrow, what can we do to help with the itching? You can reach me at [phone]. Thank you.

## 2017-11-11 ENCOUNTER — Encounter: Payer: BLUE CROSS/BLUE SHIELD | Admitting: Podiatry

## 2017-11-15 NOTE — Progress Notes (Signed)
Subjective: Tabitha Hawkins is a 22 y.o. is seen today in office s/p right Lapidus bunionectomy and mallet toe repair of the 2nd and 3rd toes preformed on 10/26/2017.  She presents today for an earlier scheduled appointment and she was scheduled for tomorrow but she was having some itching and she notes a blister on the incision she went to have the area checked.  She does not feel the area is infected but she wanted to make sure.  She states that her feet do sweat a lot.  No recent injury or falls and she is remained nonweightbearing she has been using a cam boot. She denies any systemic complaints including fevers, chills, nausea, vomiting.  No calf pain, chest pain, shortness of breath.  Objective: General: No acute distress, AAOx3  DP/PT pulses palpable 2/4, CRT < 3 sec to all digits.  Protective sensation intact. Motor function intact.  Right foot: Incision is well coapted without any evidence of dehiscence and sutures are intact.S mall amount of maceration along the incision to the hammertoes on the second and third toe.  K wire intact with any drainage or pus coming from the area.  There appears to be an old popped blister just lateral to the incision along the bunion however there is no fluid there is no erythema or fluctuation or crepitation.  It appears the area has dried.  There is no significant discomfort on the bunion site itself.  The toes and rectus position.   No other open lesions or pre-ulcerative lesions.  No pain with calf compression, swelling, warmth, erythema.   Assessment and Plan:  Status post right foot surgery, doing well with no complications   -Treatment options discussed including all alternatives, risks, and complications -I did remove some of the sutures on the second third toe but there is minimal motion across the incision site left the rest intact.  Also there is some macerated tissue on the area from her feet up and sweating.  I want her to change the bandages at  least every other day with a small amount of antibiotic ointment along the toes as well as the bunion site.  There is no evidence of acute blistering present and the only areas of old dried blister however continue to monitor for any recurrence or any further issues.  Continue nonweightbearing.  Continue cam boot at all times as well as elevation and ice.  Pain medication as needed. -Monitor for any clinical signs or symptoms of infection and directed to call the office immediately should any occur or go to the ER. -RTC 1 week or sooner if needed  Ovid Curd, DPM

## 2017-11-18 ENCOUNTER — Ambulatory Visit (INDEPENDENT_AMBULATORY_CARE_PROVIDER_SITE_OTHER): Payer: BLUE CROSS/BLUE SHIELD | Admitting: Podiatry

## 2017-11-18 DIAGNOSIS — M21619 Bunion of unspecified foot: Secondary | ICD-10-CM

## 2017-11-18 DIAGNOSIS — M205X1 Other deformities of toe(s) (acquired), right foot: Secondary | ICD-10-CM

## 2017-11-18 DIAGNOSIS — Z9889 Other specified postprocedural states: Secondary | ICD-10-CM

## 2017-11-21 NOTE — Progress Notes (Signed)
Subjective: Tabitha Hawkins is a 22 y.o. is seen today in office s/p right Lapidus bunionectomy and mallet toe repair of the 2nd and 3rd toes preformed on 10/26/2017.  She presented for suture removal.  She states that she is doing good and the foot is feeling well.  She is not taking pain medicine.  Patient in the cam boot she has been nonweightbearing.  Denies any recent injury.  She denies any systemic complaints including fevers, chills, nausea, vomiting.  No calf pain, chest pain, shortness of breath.  Objective: General: No acute distress, AAOx3  DP/PT pulses palpable 2/4, CRT < 3 sec to all digits.  Protective sensation intact. Motor function intact.  Right foot: Incision is well coapted without any evidence of dehiscence and sutures are intact.incisions appear to be healing well.  There is no drainage or pus coming from the incision and there is no surrounding redness or swelling or any red streaks.  No tenderness palpation surgical sites.  K wires are intact without any drainage.  Toes are in rectus position. No other open lesions or pre-ulcerative lesions.  No pain with calf compression, swelling, warmth, erythema.   Assessment and Plan:  Status post right foot surgery, doing well with no complications   -Treatment options discussed including all alternatives, risks, and complications -Sutures removed today without complications.  Antibiotic ointment and a bandage was applied.  Keep the dressing clean, dry, intact that she can change every couple days as needed.  Hold off on showering for now.  Continue nonweightbearing.  Ice elevation.  Pain medication as needed. -X-rays next appointment.  Likely pin removal and start to transition to weightbearing next appointment. -Monitor for any clinical signs or symptoms of infection and directed to call the office immediately should any occur or go to the ER. -RTC 1 week or sooner if needed  Vivi BarrackMatthew R Shonice Wrisley DPM

## 2017-11-22 ENCOUNTER — Encounter: Payer: BLUE CROSS/BLUE SHIELD | Admitting: Podiatry

## 2017-11-24 ENCOUNTER — Ambulatory Visit (INDEPENDENT_AMBULATORY_CARE_PROVIDER_SITE_OTHER): Payer: BLUE CROSS/BLUE SHIELD | Admitting: Podiatry

## 2017-11-24 ENCOUNTER — Ambulatory Visit (INDEPENDENT_AMBULATORY_CARE_PROVIDER_SITE_OTHER): Payer: BLUE CROSS/BLUE SHIELD

## 2017-11-24 DIAGNOSIS — M21619 Bunion of unspecified foot: Secondary | ICD-10-CM

## 2017-11-24 DIAGNOSIS — M21611 Bunion of right foot: Secondary | ICD-10-CM | POA: Diagnosis not present

## 2017-11-24 DIAGNOSIS — M205X1 Other deformities of toe(s) (acquired), right foot: Secondary | ICD-10-CM

## 2017-11-27 NOTE — Progress Notes (Signed)
Subjective: Tabitha Hawkins is a 22 y.o. is seen today in office s/p right Lapidus bunionectomy and mallet toe repair of the 2nd and 3rd toes preformed on 10/26/2017.  She presents today for possible pin removal.  She said that she is doing well she has had no pain is not taking any pain medication.  She is eager to start putting weight on her foot and start doing more activity to get the pins out.  She denies any recent injury she has no other concerns today. She denies any systemic complaints including fevers, chills, nausea, vomiting.  No calf pain, chest pain, shortness of breath.  Objective: General: No acute distress, AAOx3  DP/PT pulses palpable 2/4, CRT < 3 sec to all digits.  Protective sensation intact. Motor function intact.  Right foot: Incision is well coapted without any evidence of dehiscence and scars are formed on the incisions.  K wire intact the second third toes for any drainage or pus.  There is no pain to palpation of the surgical site and there is no erythema, drainage or pus.  No ascending cellulitis.  Overall she states that she is doing great she has no concerns. No other open lesions or pre-ulcerative lesions.  No pain with calf compression, swelling, warmth, erythema.   Assessment and Plan:  Status post right foot surgery, doing well with no complications   -Treatment options discussed including all alternatives, risks, and complications -X-rays were obtained reviewed today.  Hardware intact.  There is increased consolidation across the arthrodesis site.  No evidence of acute fracture or stress fracture identified today. -The K wires were cleaned with alcohol they were removed in total without any complications.  There was some tenderness of the second toe however she declined a nerve block.  Antibiotic ointment and a bandage was applied.  Postprocedure instructions were discussed.  She can start to transition to weightbearing as tolerated in the cam boot on if the gradual  transition over the next 2 weeks.  He did ice elevate.  I will see her back in 2 weeks at that point likely start physical therapy.  *X-rays next appointment right foot  Tabitha BarrackMatthew R Hydee Hawkins DPM

## 2017-12-06 ENCOUNTER — Ambulatory Visit (INDEPENDENT_AMBULATORY_CARE_PROVIDER_SITE_OTHER): Payer: BLUE CROSS/BLUE SHIELD | Admitting: Podiatry

## 2017-12-06 ENCOUNTER — Encounter: Payer: Self-pay | Admitting: Podiatry

## 2017-12-06 ENCOUNTER — Ambulatory Visit (INDEPENDENT_AMBULATORY_CARE_PROVIDER_SITE_OTHER): Payer: BLUE CROSS/BLUE SHIELD

## 2017-12-06 DIAGNOSIS — M2142 Flat foot [pes planus] (acquired), left foot: Secondary | ICD-10-CM | POA: Diagnosis not present

## 2017-12-06 DIAGNOSIS — M21619 Bunion of unspecified foot: Secondary | ICD-10-CM

## 2017-12-06 DIAGNOSIS — M21611 Bunion of right foot: Secondary | ICD-10-CM | POA: Diagnosis not present

## 2017-12-06 DIAGNOSIS — M2141 Flat foot [pes planus] (acquired), right foot: Secondary | ICD-10-CM | POA: Diagnosis not present

## 2017-12-06 DIAGNOSIS — M7750 Other enthesopathy of unspecified foot: Secondary | ICD-10-CM

## 2017-12-06 DIAGNOSIS — M779 Enthesopathy, unspecified: Secondary | ICD-10-CM

## 2017-12-07 NOTE — Progress Notes (Signed)
Subjective: Tabitha Hawkins is a 22 y.o. is seen today in office s/p right Lapidus bunionectomy and mallet toe repair of the 2nd and 3rd toes preformed on 10/26/2017.  Overall she states that she is doing great and she is been walking in the cam boot without any pain.  She just returned to work.  She is also been driving but taking the boot off driving she is not having any pain or increase in swelling.  She states that she is eager to start physical therapy.  She has no new concerns today. She denies any systemic complaints including fevers, chills, nausea, vomiting.  No calf pain, chest pain, shortness of breath.  Objective: General: No acute distress, AAOx3  DP/PT pulses palpable 2/4, CRT < 3 sec to all digits.  Protective sensation intact. Motor function intact.  Right foot: Incision is well coapted without any evidence of dehiscence and scars are formed.  There is no significant edema at the surgical site and there is no erythema or increase in warmth.  There is no clinical signs of infection.  There is no pain or restriction or crepitation with MPJ range of motion.  Toes are in rectus position.  She states that she is happy with the outcome of the surgery. No other open lesions or pre-ulcerative lesions.  No pain with calf compression, swelling, warmth, erythema.   Assessment and Plan:  Status post right foot surgery, doing well with no complications   -Treatment options discussed including all alternatives, risks, and complications -X-rays were obtained reviewed today.  Hardware intact.  There is increased consolidation across the arthrodesis site.  No evidence of acute fracture or stress fracture identified today. -At this time will start physical therapy.  Prescription was written for [facility] and she is to schedule that while she was in the office today.  She is progressing with physical therapy she can start to transition to regular shoe as tolerable. -Also today given her flatfoot and  recent surgery with vertigo and orthotics.  Rick evaluated her today molded her for orthotics.  Off of the first MPJ to help avoid any transfer lesions and metatarsalgia. -Follow-up with me in 3 to 4 weeks or sooner if needed.  Call any questions or concerns.  *X-rays next appointment right foot  Vivi BarrackMatthew R Forest Pruden DPM

## 2017-12-15 DIAGNOSIS — M25674 Stiffness of right foot, not elsewhere classified: Secondary | ICD-10-CM | POA: Diagnosis not present

## 2017-12-15 DIAGNOSIS — R269 Unspecified abnormalities of gait and mobility: Secondary | ICD-10-CM | POA: Diagnosis not present

## 2017-12-15 DIAGNOSIS — M25571 Pain in right ankle and joints of right foot: Secondary | ICD-10-CM | POA: Diagnosis not present

## 2017-12-15 DIAGNOSIS — M25474 Effusion, right foot: Secondary | ICD-10-CM | POA: Diagnosis not present

## 2017-12-20 ENCOUNTER — Ambulatory Visit: Payer: BLUE CROSS/BLUE SHIELD | Admitting: Orthotics

## 2017-12-20 DIAGNOSIS — M25474 Effusion, right foot: Secondary | ICD-10-CM | POA: Diagnosis not present

## 2017-12-20 DIAGNOSIS — M2142 Flat foot [pes planus] (acquired), left foot: Secondary | ICD-10-CM

## 2017-12-20 DIAGNOSIS — M25674 Stiffness of right foot, not elsewhere classified: Secondary | ICD-10-CM | POA: Diagnosis not present

## 2017-12-20 DIAGNOSIS — M2141 Flat foot [pes planus] (acquired), right foot: Secondary | ICD-10-CM

## 2017-12-20 DIAGNOSIS — M25571 Pain in right ankle and joints of right foot: Secondary | ICD-10-CM | POA: Diagnosis not present

## 2017-12-20 DIAGNOSIS — R269 Unspecified abnormalities of gait and mobility: Secondary | ICD-10-CM | POA: Diagnosis not present

## 2017-12-20 DIAGNOSIS — M205X1 Other deformities of toe(s) (acquired), right foot: Secondary | ICD-10-CM

## 2017-12-20 NOTE — Progress Notes (Signed)
Patient came in to p/up her orthotics; however she wasn't wearing appropriate footwear.  I told her to take them and try them in sneakers and report any problems..Marland Kitchen

## 2017-12-23 DIAGNOSIS — R269 Unspecified abnormalities of gait and mobility: Secondary | ICD-10-CM | POA: Diagnosis not present

## 2017-12-23 DIAGNOSIS — M25474 Effusion, right foot: Secondary | ICD-10-CM | POA: Diagnosis not present

## 2017-12-23 DIAGNOSIS — M25571 Pain in right ankle and joints of right foot: Secondary | ICD-10-CM | POA: Diagnosis not present

## 2017-12-23 DIAGNOSIS — M25674 Stiffness of right foot, not elsewhere classified: Secondary | ICD-10-CM | POA: Diagnosis not present

## 2018-01-05 DIAGNOSIS — M25474 Effusion, right foot: Secondary | ICD-10-CM | POA: Diagnosis not present

## 2018-01-05 DIAGNOSIS — M25674 Stiffness of right foot, not elsewhere classified: Secondary | ICD-10-CM | POA: Diagnosis not present

## 2018-01-05 DIAGNOSIS — R269 Unspecified abnormalities of gait and mobility: Secondary | ICD-10-CM | POA: Diagnosis not present

## 2018-01-05 DIAGNOSIS — M25571 Pain in right ankle and joints of right foot: Secondary | ICD-10-CM | POA: Diagnosis not present

## 2018-01-12 DIAGNOSIS — R269 Unspecified abnormalities of gait and mobility: Secondary | ICD-10-CM | POA: Diagnosis not present

## 2018-01-12 DIAGNOSIS — M25474 Effusion, right foot: Secondary | ICD-10-CM | POA: Diagnosis not present

## 2018-01-12 DIAGNOSIS — M25674 Stiffness of right foot, not elsewhere classified: Secondary | ICD-10-CM | POA: Diagnosis not present

## 2018-01-12 DIAGNOSIS — M25571 Pain in right ankle and joints of right foot: Secondary | ICD-10-CM | POA: Diagnosis not present

## 2018-01-17 DIAGNOSIS — M25571 Pain in right ankle and joints of right foot: Secondary | ICD-10-CM | POA: Diagnosis not present

## 2018-01-17 DIAGNOSIS — M25474 Effusion, right foot: Secondary | ICD-10-CM | POA: Diagnosis not present

## 2018-01-17 DIAGNOSIS — R269 Unspecified abnormalities of gait and mobility: Secondary | ICD-10-CM | POA: Diagnosis not present

## 2018-01-17 DIAGNOSIS — M25674 Stiffness of right foot, not elsewhere classified: Secondary | ICD-10-CM | POA: Diagnosis not present

## 2018-01-19 DIAGNOSIS — M25474 Effusion, right foot: Secondary | ICD-10-CM | POA: Diagnosis not present

## 2018-01-19 DIAGNOSIS — R269 Unspecified abnormalities of gait and mobility: Secondary | ICD-10-CM | POA: Diagnosis not present

## 2018-01-19 DIAGNOSIS — M25571 Pain in right ankle and joints of right foot: Secondary | ICD-10-CM | POA: Diagnosis not present

## 2018-01-19 DIAGNOSIS — M25674 Stiffness of right foot, not elsewhere classified: Secondary | ICD-10-CM | POA: Diagnosis not present

## 2018-01-24 DIAGNOSIS — B379 Candidiasis, unspecified: Secondary | ICD-10-CM | POA: Diagnosis not present

## 2018-01-24 DIAGNOSIS — L301 Dyshidrosis [pompholyx]: Secondary | ICD-10-CM | POA: Diagnosis not present

## 2018-01-24 DIAGNOSIS — L299 Pruritus, unspecified: Secondary | ICD-10-CM | POA: Diagnosis not present

## 2018-04-05 DIAGNOSIS — J039 Acute tonsillitis, unspecified: Secondary | ICD-10-CM | POA: Diagnosis not present

## 2018-04-05 DIAGNOSIS — R062 Wheezing: Secondary | ICD-10-CM | POA: Diagnosis not present

## 2018-04-05 DIAGNOSIS — J209 Acute bronchitis, unspecified: Secondary | ICD-10-CM | POA: Diagnosis not present

## 2018-04-05 DIAGNOSIS — J301 Allergic rhinitis due to pollen: Secondary | ICD-10-CM | POA: Diagnosis not present

## 2018-05-09 ENCOUNTER — Other Ambulatory Visit: Payer: Self-pay | Admitting: Internal Medicine

## 2018-05-11 ENCOUNTER — Ambulatory Visit: Payer: BLUE CROSS/BLUE SHIELD | Admitting: Internal Medicine

## 2018-05-11 ENCOUNTER — Encounter: Payer: Self-pay | Admitting: Internal Medicine

## 2018-05-11 VITALS — BP 108/80 | HR 64 | Temp 98.2°F | Resp 16 | Ht 69.0 in | Wt 174.0 lb

## 2018-05-11 DIAGNOSIS — Z23 Encounter for immunization: Secondary | ICD-10-CM | POA: Diagnosis not present

## 2018-05-11 DIAGNOSIS — F329 Major depressive disorder, single episode, unspecified: Secondary | ICD-10-CM

## 2018-05-11 DIAGNOSIS — F1010 Alcohol abuse, uncomplicated: Secondary | ICD-10-CM | POA: Diagnosis not present

## 2018-05-11 DIAGNOSIS — F32A Depression, unspecified: Secondary | ICD-10-CM

## 2018-05-11 DIAGNOSIS — R21 Rash and other nonspecific skin eruption: Secondary | ICD-10-CM | POA: Diagnosis not present

## 2018-05-11 DIAGNOSIS — F419 Anxiety disorder, unspecified: Secondary | ICD-10-CM | POA: Diagnosis not present

## 2018-05-11 MED ORDER — FLUOXETINE HCL 20 MG PO TABS: 20.0000 mg | ORAL_TABLET | Freq: Every day | ORAL | 6 refills | Status: DC

## 2018-05-11 MED ORDER — KETOCONAZOLE 2 % EX CREA: 1.0000 "application " | TOPICAL_CREAM | Freq: Every day | CUTANEOUS | 0 refills | Status: DC

## 2018-05-11 NOTE — Progress Notes (Signed)
Subjective:    Patient ID: Tabitha Hawkins, female    DOB: 08/30/1995, 22 y.o.   MRN: [id number]  DOS:  05/11/2018 Type of visit - description : f/u  The patient reports good compliance with medication, asthma is not an issue at this point Although anxiety is well controlled and she feels "very good" she also admits to drinking excessively. 6 beers a days , most days. There are days that she feels completely unmotivated and skips work. Occasionally uses marijuana Also has a pruritic rash at the right foot.  Fungus?.  Review of Systems Denies suicidal ideas   Past Medical History:  Diagnosis Date  . ADHD (attention deficit hyperactivity disorder)   . H/O multiple concussions    seen by neuro  . Headache    frequent  . Short-term memory loss    cause from multiple concussions?    Past Surgical History:  Procedure Laterality Date  . KNEE ARTHROSCOPY Left 2016    Social History   Socioeconomic History  . Marital status: Single    Spouse name: Not on file  . Number of children: 0  . Years of education: Not on file  . Highest education level: Not on file  Occupational History  . Occupation: Wellsite geologistwaitress   Social Needs  . Financial resource strain: Not on file  . Food insecurity:    Worry: Not on file    Inability: Not on file  . Transportation needs:    Medical: Not on file    Non-medical: Not on file  Tobacco Use  . Smoking status: Never Smoker  . Smokeless tobacco: Never Used  Substance and Sexual Activity  . Alcohol use: No  . Drug use: No  . Sexual activity: Yes    Birth control/protection: Implant  Lifestyle  . Physical activity:    Days per week: Not on file    Minutes per session: Not on file  . Stress: Not on file  Relationships  . Social connections:    Talks on phone: Not on file    Gets together: Not on file    Attends religious service: Not on file    Active member of club or organization: Not on file    Attends meetings of clubs or  organizations: Not on file    Relationship status: Not on file  . Intimate partner violence:    Fear of current or ex partner: Not on file    Emotionally abused: Not on file    Physically abused: Not on file    Forced sexual activity: Not on file  Other Topics Concern  . Not on file  Social History Narrative   Household-- father and mother    Stopped college [employer], [place] d/t a concussion, couldn't play volley-ball 05-2015          Allergies as of 05/11/2018   No Known Allergies     Medication List        Accurate as of 05/11/18  5:21 PM. Always use your most recent med list.          albuterol 108 (90 Base) MCG/ACT inhaler Commonly known as:  PROVENTIL HFA;VENTOLIN HFA Inhale 2 puffs into the lungs every 6 (six) hours as needed for wheezing or shortness of breath.   budesonide-formoterol 160-4.5 MCG/ACT inhaler Commonly known as:  SYMBICORT Inhale 2 puffs into the lungs 2 (two) times daily.   etonogestrel 68 MG Impl implant Commonly known as:  NEXPLANON 1 each by Subdermal route  once.   FLUoxetine 20 MG tablet Commonly known as:  PROZAC Take 1 tablet (20 mg total) by mouth daily.   ketoconazole 2 % cream Commonly known as:  NIZORAL Apply 1 application topically daily.           Objective:   Physical Exam  Musculoskeletal:       Feet:   BP 108/80 (BP Location: Right Arm, Patient Position: Sitting, Cuff Size: Normal)   Pulse 64   Temp 98.2 F (36.8 C) (Oral)   Resp 16   Ht 5\' 9"  (1.753 m)   Wt 174 lb (78.9 kg)   SpO2 97%   BMI 25.70 kg/m  General:   Well developed, NAD, BMI noted. HEENT:  Normocephalic . Face symmetric, atraumatic Neurologic:  alert & oriented X3.  Speech normal, gait appropriate for age and unassisted Psych--  Cognition and judgment appear intact.  Cooperative with normal attention span and concentration.  Behavior appropriate. No anxious or depressed appearing.      Assessment & Plan:     Assessment Asthma ADD- Saw a psychologist 2015, DX ADHD, inattentive type. Rx adderall  08-2013 Postconcussion syndrome: DX 05-2015, saw neuro- d/c college sports  Neuropsychological evaluation 08/13/2015 referred by neurology, report reviewed, dx  ADD with hyperactivity; + sx of  postconcussion including headaches. Anxiety-depression  PLAN: Anxiety depression: Well-controlled. Alcohol abuse: She has a family history of substance abuse (biological mother died from overdose according to the patient) I recommend her to tackle this issue without hesitation. She definitely needs counseling, information provided.  Also AA meetings would be very helpful. At this point she does not need more medication for anxiety and depression. Rash: Likely fungal, send Nizoral.  Call if not better Preventive care: Tdap, Bexero #1 today. 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 RTC 4 months . 1 month for shots   Today, I spent more than 25   min with the patient: >50% of the time counseling regards etoh abuse, discussed need to see a counselor, need to face the problem w/o delay,  AA?

## 2018-05-11 NOTE — Patient Instructions (Addendum)
Next visit in 4 months, please make an appointment   Seek counseling  AA meetings ?  Counselors to consider  See list, also    Kem ParkinsonPatty Von Steen Address: [address], [place], [place] [address]  Phone: [phone]   Evette CristalKenneth Frazier  Address: [address], [place], [place] [address]  Phone: [phone]

## 2018-05-11 NOTE — Assessment & Plan Note (Signed)
Anxiety depression: Well-controlled. Alcohol abuse: She has a family history of substance abuse (biological mother died from overdose according to the patient) I recommend her to tackle this issue without hesitation. She definitely needs counseling, information provided.  Also AA meetings would be very helpful. At this point she does not need more medication for anxiety and depression. Rash: Likely fungal, send Nizoral.  Call if not better Preventive care: Tdap, Bexero #1 today. 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 RTC 4 months . 1 month for shots

## 2018-06-08 ENCOUNTER — Ambulatory Visit (INDEPENDENT_AMBULATORY_CARE_PROVIDER_SITE_OTHER): Payer: BLUE CROSS/BLUE SHIELD | Admitting: Podiatry

## 2018-06-08 ENCOUNTER — Ambulatory Visit (INDEPENDENT_AMBULATORY_CARE_PROVIDER_SITE_OTHER): Payer: BLUE CROSS/BLUE SHIELD

## 2018-06-08 DIAGNOSIS — B353 Tinea pedis: Secondary | ICD-10-CM

## 2018-06-08 DIAGNOSIS — M21619 Bunion of unspecified foot: Secondary | ICD-10-CM

## 2018-06-08 DIAGNOSIS — M2011 Hallux valgus (acquired), right foot: Secondary | ICD-10-CM

## 2018-06-08 MED ORDER — CLOTRIMAZOLE-BETAMETHASONE 1-0.05 % EX CREA: 1.0000 "application " | TOPICAL_CREAM | Freq: Two times a day (BID) | CUTANEOUS | 0 refills | Status: DC

## 2018-06-09 ENCOUNTER — Other Ambulatory Visit: Payer: Self-pay | Admitting: Podiatry

## 2018-06-09 DIAGNOSIS — M2011 Hallux valgus (acquired), right foot: Secondary | ICD-10-CM

## 2018-06-11 NOTE — Progress Notes (Signed)
Subjective: 22 year old female presents the office today for concerns of skin rash on the right foot that itches quite a bit.  She seen other doctors for this and she was given ketoconazole is not helping.  From a surgical standpoint she states that she is very happy with the outcome of the surgery she is having no pain. Denies any systemic complaints such as fevers, chills, nausea, vomiting. No acute changes since last appointment, and no other complaints at this time.   Objective: AAO x3, NAD DP/PT pulses palpable bilaterally, CRT less than 3 seconds Surgical sites are well-healed.  On the dorsal aspect the right foot as well as interdigitally there is an erythematous skin with peeling skin consistent with tinea pedis.  There is no skin breakdown or drainage or pus. No open lesions or pre-ulcerative lesions.  No pain with calf compression, swelling, warmth, erythema  Assessment: Likely tinea pedis right  Plan: -All treatment options discussed with the patient including all alternatives, risks, complications.  -X-rays were obtained and reviewed no evidence of acute fracture in the arthrodesis site appears to be healed. -Prescribed Lotrisone cream.  Discussed shoe modifications as well as not wearing the same shoes every day.  Discussed vinegar soaks. -No improvement next 10 days let me know.  Otherwise I will see her as needed. -Patient encouraged to call the office with any questions, concerns, change in symptoms.   Vivi BarrackMatthew R Ameli Sangiovanni DPM

## 2018-06-30 ENCOUNTER — Encounter: Payer: Self-pay | Admitting: Internal Medicine

## 2018-06-30 ENCOUNTER — Ambulatory Visit (INDEPENDENT_AMBULATORY_CARE_PROVIDER_SITE_OTHER): Payer: BLUE CROSS/BLUE SHIELD | Admitting: Internal Medicine

## 2018-06-30 VITALS — BP 108/76 | HR 73 | Temp 98.3°F | Resp 16 | Ht 69.0 in | Wt 177.4 lb

## 2018-06-30 DIAGNOSIS — J40 Bronchitis, not specified as acute or chronic: Secondary | ICD-10-CM | POA: Diagnosis not present

## 2018-06-30 DIAGNOSIS — J4531 Mild persistent asthma with (acute) exacerbation: Secondary | ICD-10-CM

## 2018-06-30 MED ORDER — AZITHROMYCIN 250 MG PO TABS: ORAL_TABLET | ORAL | 0 refills | Status: DC

## 2018-06-30 MED ORDER — PREDNISONE 10 MG PO TABS: ORAL_TABLET | ORAL | 0 refills | Status: DC

## 2018-06-30 NOTE — Patient Instructions (Addendum)
Rest, fluids , tylenol  For cough:  Take Mucinex DM twice a day as needed until better  Albuterol for wheezing or persistent cough  For nasal congestion: Use OTC   Flonase : 2 nasal sprays on each side of the nose in the morning until you feel better   Take the antibiotic as prescribed zithromax May affect your birth control, be careful  Prednisone for few days, see RX   Call if not gradually better over the next  10 days  Call anytime if the symptoms are severe   Once better call for your shot (BEXERO #2)

## 2018-06-30 NOTE — Progress Notes (Signed)
Pre visit review using our clinic review tool, if applicable. No additional management support is needed unless otherwise documented below in the visit note. 

## 2018-06-30 NOTE — Progress Notes (Signed)
Subjective:    Patient ID: Tabitha Hawkins, female    DOB: 12/31/1995, 23 y.o.   MRN: [id number]  DOS:  06/30/2018 Type of visit - description: Acute visit Symptoms started more than 2 weeks ago: Cough, chest congestion, greenish sputum production. Good compliance with Symbicort, using his rescue inhaler every other day.   Review of Systems  Denies previous fever, chills or aches. Has on and off sinus pain and congestion but little nasal discharge. Mild shortness of breath with symptoms.  Past Medical History:  Diagnosis Date  . ADHD (attention deficit hyperactivity disorder)   . H/O multiple concussions    seen by neuro  . Headache    frequent  . Short-term memory loss    cause from multiple concussions?    Past Surgical History:  Procedure Laterality Date  . KNEE ARTHROSCOPY Left 2016    Social History   Socioeconomic History  . Marital status: Single    Spouse name: Not on file  . Number of children: 0  . Years of education: Not on file  . Highest education level: Not on file  Occupational History  . Occupation: Wellsite geologistwaitress   Social Needs  . Financial resource strain: Not on file  . Food insecurity:    Worry: Not on file    Inability: Not on file  . Transportation needs:    Medical: Not on file    Non-medical: Not on file  Tobacco Use  . Smoking status: Never Smoker  . Smokeless tobacco: Never Used  Substance and Sexual Activity  . Alcohol use: No  . Drug use: No  . Sexual activity: Yes    Birth control/protection: Implant  Lifestyle  . Physical activity:    Days per week: Not on file    Minutes per session: Not on file  . Stress: Not on file  Relationships  . Social connections:    Talks on phone: Not on file    Gets together: Not on file    Attends religious service: Not on file    Active member of club or organization: Not on file    Attends meetings of clubs or organizations: Not on file    Relationship status: Not on file  . Intimate partner  violence:    Fear of current or ex partner: Not on file    Emotionally abused: Not on file    Physically abused: Not on file    Forced sexual activity: Not on file  Other Topics Concern  . Not on file  Social History Narrative   Household-- father and mother    Stopped [employer], [place]/t [place] concussion, couldn't play volley-ball 05-2015          Allergies as of 06/30/2018   No Known Allergies     Medication List       Accurate as of June 30, 2018 11:59 PM. Always use your most recent med list.        albuterol 108 (90 Base) MCG/ACT inhaler Commonly known as:  VENTOLIN HFA Inhale 2 puffs into the lungs every 6 (six) hours as needed for wheezing or shortness of breath.   azithromycin 250 MG tablet Commonly known as:  ZITHROMAX Z-PAK 2 tabs a day the first day, then 1 tab a day x 4 days   budesonide-formoterol 160-4.5 MCG/ACT inhaler Commonly known as:  SYMBICORT Inhale 2 puffs into the lungs 2 (two) times daily.   etonogestrel 68 MG Impl implant Commonly known as:  NEXPLANON 1 each by Subdermal route once.   FLUoxetine 20 MG tablet Commonly known as:  PROZAC Take 1 tablet (20 mg total) by mouth daily.   predniSONE 10 MG tablet Commonly known as:  DELTASONE 4 tablets x 2 days, 3 tabs x 2 days, 2 tabs x 2 days, 1 tab x 2 days           Objective:   Physical Exam BP 108/76 (BP Location: Left Arm, Patient Position: Sitting, Cuff Size: Small)   Pulse 73   Temp 98.3 F (36.8 C) (Oral)   Resp 16   Ht 5\' 9"  (1.753 m)   Wt 177 lb 6 oz (80.5 kg)   SpO2 96%   BMI 26.19 kg/m  General:   Well developed, NAD, BMI noted. HEENT:  Normocephalic . Face symmetric, atraumatic.  TMs normal.  Nose is slightly congested, sinuses no TTP.  Throat minimal redness, no discharge, symmetric Lungs:  + Rhonchi, mild prolonged expiratory time without wheezing. Normal respiratory effort, no intercostal retractions, no accessory muscle use. Heart: RRR,  no murmur.   No pretibial edema bilaterally  Skin: Not pale. Not jaundice Neurologic:  alert & oriented X3.  Speech normal, gait appropriate for age and unassisted Psych--  Cognition and judgment appear intact.  Cooperative with normal attention span and concentration.  Behavior appropriate. No anxious or depressed appearing.      Assessment    Assessment Asthma ADD- Saw a psychologist 2015, DX ADHD, inattentive type. Rx adderall  08-2013 Postconcussion syndrome: DX 05-2015, saw neuro- d/c college sports  Neuropsychological evaluation 08/13/2015 referred by neurology, report reviewed, dx  ADD with hyperactivity; + sx of  postconcussion including headaches. Anxiety-depression  PLAN: Bronchitis, mild asthma exacerbation: Recommend to continue Symbicort, add Zithromax (aware may affect her birth control), prednisone for few days.  Continue albuterol as needed, use it for wheezing or persistent cough.  Also supportive treatment, see AVS, call if not better.

## 2018-07-01 ENCOUNTER — Other Ambulatory Visit: Payer: Self-pay | Admitting: Internal Medicine

## 2018-07-01 NOTE — Assessment & Plan Note (Signed)
Bronchitis, mild asthma exacerbation: Recommend to continue Symbicort, add Zithromax (aware may affect her birth control), prednisone for few days.  Continue albuterol as needed, use it for wheezing or persistent cough.  Also supportive treatment, see AVS, call if not better.

## 2018-07-31 DIAGNOSIS — Z124 Encounter for screening for malignant neoplasm of cervix: Secondary | ICD-10-CM | POA: Diagnosis not present

## 2018-07-31 DIAGNOSIS — Z130 Encounter for screening for diseases of the blood and blood-forming organs and certain disorders involving the immune mechanism: Secondary | ICD-10-CM | POA: Diagnosis not present

## 2018-07-31 DIAGNOSIS — N921 Excessive and frequent menstruation with irregular cycle: Secondary | ICD-10-CM | POA: Diagnosis not present

## 2018-07-31 DIAGNOSIS — Z3046 Encounter for surveillance of implantable subdermal contraceptive: Secondary | ICD-10-CM | POA: Diagnosis not present

## 2018-07-31 DIAGNOSIS — Z01419 Encounter for gynecological examination (general) (routine) without abnormal findings: Secondary | ICD-10-CM | POA: Diagnosis not present

## 2018-08-01 DIAGNOSIS — S2231XA Fracture of one rib, right side, initial encounter for closed fracture: Secondary | ICD-10-CM | POA: Diagnosis not present

## 2018-08-01 DIAGNOSIS — Z124 Encounter for screening for malignant neoplasm of cervix: Secondary | ICD-10-CM | POA: Diagnosis not present

## 2018-08-05 LAB — HM PAP SMEAR

## 2018-08-09 ENCOUNTER — Encounter: Payer: Self-pay | Admitting: Internal Medicine

## 2018-08-09 ENCOUNTER — Telehealth: Payer: Self-pay | Admitting: *Deleted

## 2018-08-09 NOTE — Telephone Encounter (Signed)
Received Medical records from [facility]; forwarded to provider/SLS 02/19

## 2018-10-27 ENCOUNTER — Other Ambulatory Visit: Payer: Self-pay

## 2018-10-27 ENCOUNTER — Ambulatory Visit (INDEPENDENT_AMBULATORY_CARE_PROVIDER_SITE_OTHER): Payer: BLUE CROSS/BLUE SHIELD | Admitting: Internal Medicine

## 2018-10-27 ENCOUNTER — Encounter: Payer: Self-pay | Admitting: Internal Medicine

## 2018-10-27 DIAGNOSIS — F32A Depression, unspecified: Secondary | ICD-10-CM

## 2018-10-27 DIAGNOSIS — F419 Anxiety disorder, unspecified: Secondary | ICD-10-CM | POA: Diagnosis not present

## 2018-10-27 DIAGNOSIS — J4520 Mild intermittent asthma, uncomplicated: Secondary | ICD-10-CM

## 2018-10-27 DIAGNOSIS — F329 Major depressive disorder, single episode, unspecified: Secondary | ICD-10-CM | POA: Diagnosis not present

## 2018-10-27 NOTE — Progress Notes (Signed)
Subjective:    Patient ID: Tabitha Hawkins, female    DOB: 08/28/1995, 23 y.o.   MRN: [id number]  DOS:  10/27/2018 Type of visit - description: Virtual Visit via Video Note  I connected with@ on 10/29/18 at  2:00 PM EDT by a video enabled telemedicine application and verified that I am speaking with the correct person using two identifiers.   THIS ENCOUNTER IS A VIRTUAL VISIT DUE TO COVID-19 - PATIENT WAS NOT SEEN IN THE OFFICE. PATIENT HAS CONSENTED TO VIRTUAL VISIT / TELEMEDICINE VISIT   Location of patient: home  Location of provider: office  I discussed the limitations of evaluation and management by telemedicine and the availability of in person appointments. The patient expressed understanding and agreed to proceed.  History of Present Illness: During visit In general she is doing well.  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 She has been in quarantine at home with her roommate, they get along really well. Despite her stress she is doing well emotionally. Asthma seems to be well controlled Medication list reviewed and edited.   Review of Systems Denies fever chills No nasal congestion No nausea, vomiting, diarrhea. No cough Past Medical History:  Diagnosis Date  . ADHD (attention deficit hyperactivity disorder)   . H/O multiple concussions    seen by neuro  . Headache    frequent  . Short-term memory loss    cause from multiple concussions?    Past Surgical History:  Procedure Laterality Date  . KNEE ARTHROSCOPY Left 2016    Social History   Socioeconomic History  . Marital status: Single    Spouse name: Not on file  . Number of children: 0  . Years of education: Not on file  . Highest education level: Not on file  Occupational History  . Occupation: Wellsite geologistwaitress   Social Needs  . Financial resource strain: Not on file  . Food insecurity:    Worry: Not on file    Inability: Not on file  . Transportation needs:    Medical: Not on file   Non-medical: Not on file  Tobacco Use  . Smoking status: Never Smoker  . Smokeless tobacco: Never Used  Substance and Sexual Activity  . Alcohol use: No  . Drug use: No  . Sexual activity: Yes    Birth control/protection: Implant  Lifestyle  . Physical activity:    Days per week: Not on file    Minutes per session: Not on file  . Stress: Not on file  Relationships  . Social connections:    Talks on phone: Not on file    Gets together: Not on file    Attends religious service: Not on file    Active member of club or organization: Not on file    Attends meetings of clubs or organizations: Not on file    Relationship status: Not on file  . Intimate partner violence:    Fear of current or ex partner: Not on file    Emotionally abused: Not on file    Physically abused: Not on file    Forced sexual activity: Not on file  Other Topics Concern  . Not on file  Social History Narrative   Household-- father and mother    Stopped [employer]/t [place] concussion, couldn't play volley-ball 05-2015          Allergies as of 10/27/2018   No Known Allergies     Medication List  Accurate as of Oct 27, 2018 11:59 PM. If you have any questions, ask your nurse or doctor.        STOP taking these medications   azithromycin 250 MG tablet Commonly known as:  Zithromax Z-Pak Stopped by:  Willow Ora, MD   predniSONE 10 MG tablet Commonly known as:  DELTASONE Stopped by:  Willow Ora, MD     TAKE these medications   albuterol 108 (90 Base) MCG/ACT inhaler Commonly known as:  Ventolin HFA Inhale 2 puffs into the lungs every 6 (six) hours as needed for wheezing or shortness of breath.   budesonide-formoterol 160-4.5 MCG/ACT inhaler Commonly known as:  SYMBICORT Inhale 2 puffs into the lungs 2 (two) times daily.   etonogestrel 68 MG Impl implant Commonly known as:  NEXPLANON 1 each by Subdermal route once.   FLUoxetine 20 MG tablet Commonly known as:  PROZAC Take  1 tablet (20 mg total) by mouth daily.   norgestimate-ethinyl estradiol 0.25-35 MG-MCG tablet Commonly known as:  ORTHO-CYCLEN Take 1 tablet by mouth daily.   Estarylla 0.25-35 MG-MCG tablet Generic drug:  norgestimate-ethinyl estradiol Take 1 tablet by mouth daily.           Objective:   Physical Exam There were no vitals taken for this visit. This is a virtual video visit, alert oriented x3, she seems to be in good spirits    Assessment     Assessment Asthma ADD- Saw a psychologist 2015, DX ADHD, inattentive type. Rx adderall  08-2013 Postconcussion syndrome: DX 05-2015, saw neuro- d/c college sports  Neuropsychological evaluation 08/13/2015 referred by neurology, report reviewed, dx  ADD with hyperactivity; + sx of  postconcussion including headaches. Anxiety-depression  PLAN: Asthma: Taking Symbicort few times a week albuterol 2 times a month on average.  Well controlled, no change Anxiety depression: The patient is stressed due to COVID-19 and the loss of her job, however is doing okay on fluoxetine.  No change, refill as needed.  She and her roommate get along very well and they support each other.  She knows to reach out to this office if she needs help Birth control: In addition to Nexplanon she is now taking BCPs EtOH: Reports no recent problems, drinking socially at most. RTC 6 months    I discussed the assessment and treatment plan with the patient. The patient was provided an opportunity to ask questions and all were answered. The patient agreed with the plan and demonstrated an understanding of the instructions.   The patient was advised to call back or seek an in-person evaluation if the symptoms worsen or if the condition fails to improve as anticipated.

## 2018-10-29 NOTE — Assessment & Plan Note (Signed)
Asthma: Taking Symbicort few times a week albuterol 2 times a month on average.  Well controlled, no change Anxiety depression: The patient is stressed due to COVID-19 and the loss of her job, however is doing okay on fluoxetine.  No change, refill as needed.  She and her roommate get along very well and they support each other.  She knows to reach out to this office if she needs help Birth control: In addition to Nexplanon she is now taking BCPs EtOH: Reports no recent problems, drinking socially at most. RTC 6 months

## 2018-12-13 ENCOUNTER — Other Ambulatory Visit: Payer: Self-pay

## 2018-12-13 ENCOUNTER — Ambulatory Visit (INDEPENDENT_AMBULATORY_CARE_PROVIDER_SITE_OTHER): Payer: BC Managed Care – PPO | Admitting: Orthotics

## 2018-12-13 DIAGNOSIS — B353 Tinea pedis: Secondary | ICD-10-CM

## 2018-12-13 DIAGNOSIS — M2141 Flat foot [pes planus] (acquired), right foot: Secondary | ICD-10-CM | POA: Diagnosis not present

## 2018-12-13 DIAGNOSIS — M2142 Flat foot [pes planus] (acquired), left foot: Secondary | ICD-10-CM | POA: Diagnosis not present

## 2018-12-13 DIAGNOSIS — M21619 Bunion of unspecified foot: Secondary | ICD-10-CM

## 2018-12-13 NOTE — Progress Notes (Signed)
Repeating 2019 order same specs

## 2018-12-21 ENCOUNTER — Inpatient Hospital Stay (HOSPITAL_COMMUNITY)
Admission: AD | Admit: 2018-12-21 | Discharge: 2018-12-25 | DRG: 881 | Disposition: A | Discharge status: Home / Self Care | Payer: BC Managed Care – PPO | Source: Intra-hospital | Attending: Psychiatry | Admitting: Psychiatry

## 2018-12-21 ENCOUNTER — Emergency Department (HOSPITAL_BASED_OUTPATIENT_CLINIC_OR_DEPARTMENT_OTHER)
Admission: EM | Admit: 2018-12-21 | Discharge: 2018-12-21 | Disposition: A | Discharge status: Other Acute Inpatient Hospital | Payer: BC Managed Care – PPO | Source: Home / Self Care | Attending: Emergency Medicine | Admitting: Emergency Medicine

## 2018-12-21 ENCOUNTER — Encounter (HOSPITAL_COMMUNITY): Payer: Self-pay

## 2018-12-21 ENCOUNTER — Encounter (HOSPITAL_BASED_OUTPATIENT_CLINIC_OR_DEPARTMENT_OTHER): Payer: Self-pay | Admitting: Emergency Medicine

## 2018-12-21 ENCOUNTER — Other Ambulatory Visit: Payer: Self-pay

## 2018-12-21 ENCOUNTER — Other Ambulatory Visit: Payer: Self-pay | Admitting: Behavioral Health

## 2018-12-21 DIAGNOSIS — F10129 Alcohol abuse with intoxication, unspecified: Secondary | ICD-10-CM | POA: Diagnosis not present

## 2018-12-21 DIAGNOSIS — Z1159 Encounter for screening for other viral diseases: Secondary | ICD-10-CM

## 2018-12-21 DIAGNOSIS — S61511A Laceration without foreign body of right wrist, initial encounter: Secondary | ICD-10-CM

## 2018-12-21 DIAGNOSIS — G4700 Insomnia, unspecified: Secondary | ICD-10-CM | POA: Diagnosis present

## 2018-12-21 DIAGNOSIS — F330 Major depressive disorder, recurrent, mild: Secondary | ICD-10-CM | POA: Diagnosis present

## 2018-12-21 DIAGNOSIS — R45851 Suicidal ideations: Secondary | ICD-10-CM | POA: Diagnosis present

## 2018-12-21 DIAGNOSIS — F10920 Alcohol use, unspecified with intoxication, uncomplicated: Secondary | ICD-10-CM

## 2018-12-21 DIAGNOSIS — F332 Major depressive disorder, recurrent severe without psychotic features: Secondary | ICD-10-CM | POA: Insufficient documentation

## 2018-12-21 DIAGNOSIS — F331 Major depressive disorder, recurrent, moderate: Secondary | ICD-10-CM | POA: Diagnosis present

## 2018-12-21 DIAGNOSIS — E876 Hypokalemia: Secondary | ICD-10-CM

## 2018-12-21 DIAGNOSIS — Z03818 Encounter for observation for suspected exposure to other biological agents ruled out: Secondary | ICD-10-CM | POA: Diagnosis not present

## 2018-12-21 DIAGNOSIS — R41843 Psychomotor deficit: Secondary | ICD-10-CM | POA: Diagnosis not present

## 2018-12-21 DIAGNOSIS — Z79899 Other long term (current) drug therapy: Secondary | ICD-10-CM | POA: Insufficient documentation

## 2018-12-21 DIAGNOSIS — F329 Major depressive disorder, single episode, unspecified: Principal | ICD-10-CM | POA: Diagnosis present

## 2018-12-21 DIAGNOSIS — F3341 Major depressive disorder, recurrent, in partial remission: Secondary | ICD-10-CM | POA: Diagnosis present

## 2018-12-21 DIAGNOSIS — F3342 Major depressive disorder, recurrent, in full remission: Secondary | ICD-10-CM | POA: Diagnosis present

## 2018-12-21 LAB — RAPID URINE DRUG SCREEN, HOSP PERFORMED
Amphetamines: NOT DETECTED
Barbiturates: NOT DETECTED
Benzodiazepines: NOT DETECTED
Cocaine: NOT DETECTED
Opiates: NOT DETECTED
Tetrahydrocannabinol: POSITIVE — AB

## 2018-12-21 LAB — COMPREHENSIVE METABOLIC PANEL
ALT: 32 U/L (ref 0–44)
AST: 25 U/L (ref 15–41)
Albumin: 4.2 g/dL (ref 3.5–5.0)
Alkaline Phosphatase: 77 U/L (ref 38–126)
Anion gap: 12 (ref 5–15)
BUN: 7 mg/dL (ref 6–20)
CO2: 19 mmol/L — ABNORMAL LOW (ref 22–32)
Calcium: 9 mg/dL (ref 8.9–10.3)
Chloride: 109 mmol/L (ref 98–111)
Creatinine, Ser: 0.72 mg/dL (ref 0.44–1.00)
GFR calc Af Amer: >60 mL/min (ref >60)
GFR calc non Af Amer: >60 mL/min (ref >60)
Glucose, Bld: 93 mg/dL (ref 70–99)
Potassium: 3.3 mmol/L — ABNORMAL LOW (ref 3.5–5.1)
Sodium: 140 mmol/L (ref 135–145)
Total Bilirubin: 0.5 mg/dL (ref 0.3–1.2)
Total Protein: 7.5 g/dL (ref 6.5–8.1)

## 2018-12-21 LAB — ETHANOL: Alcohol, Ethyl (B): 173 mg/dL — ABNORMAL HIGH (ref <10)

## 2018-12-21 LAB — SALICYLATE LEVEL: Salicylate Lvl: <7 mg/dL (ref 2.8–30.0)

## 2018-12-21 LAB — ACETAMINOPHEN LEVEL: Acetaminophen (Tylenol), Serum: <10 ug/mL — ABNORMAL LOW (ref 10–30)

## 2018-12-21 LAB — SARS CORONAVIRUS 2 AG (30 MIN TAT): SARS Coronavirus 2 Ag: NEGATIVE

## 2018-12-21 LAB — PREGNANCY, URINE: Preg Test, Ur: NEGATIVE

## 2018-12-21 MED ORDER — LIDOCAINE-EPINEPHRINE (PF) 2 %-1:200000 IJ SOLN
10.0000 mL | Freq: Once | INTRAMUSCULAR | Status: AC
  Administered 2018-12-21: 10 mL
  Filled 2018-12-21 (×2): qty 10

## 2018-12-21 MED ORDER — TRAZODONE HCL 50 MG PO TABS
50.0000 mg | ORAL_TABLET | Freq: Every evening | ORAL | Status: DC | PRN
  Administered 2018-12-21 – 2018-12-24 (×4): 50 mg via ORAL
  Filled 2018-12-21 (×12): qty 1

## 2018-12-21 MED ORDER — ACETAMINOPHEN 325 MG PO TABS
650.0000 mg | ORAL_TABLET | ORAL | Status: DC | PRN
  Administered 2018-12-21: 650 mg via ORAL
  Filled 2018-12-21: qty 2

## 2018-12-21 MED ORDER — ACETAMINOPHEN 325 MG PO TABS
650.0000 mg | ORAL_TABLET | Freq: Four times a day (QID) | ORAL | Status: DC | PRN
  Administered 2018-12-21: 650 mg via ORAL
  Filled 2018-12-21: qty 2

## 2018-12-21 MED ORDER — HYDROXYZINE HCL 25 MG PO TABS
25.0000 mg | ORAL_TABLET | Freq: Three times a day (TID) | ORAL | Status: DC | PRN
  Administered 2018-12-21 – 2018-12-24 (×4): 25 mg via ORAL
  Filled 2018-12-21 (×3): qty 1

## 2018-12-21 MED ORDER — POTASSIUM CHLORIDE CRYS ER 20 MEQ PO TBCR
40.0000 meq | EXTENDED_RELEASE_TABLET | Freq: Once | ORAL | Status: AC
  Administered 2018-12-21: 40 meq via ORAL
  Filled 2018-12-21: qty 2

## 2018-12-21 MED ORDER — ALUM & MAG HYDROXIDE-SIMETH 200-200-20 MG/5ML PO SUSP: 30.0000 mL | Freq: Four times a day (QID) | ORAL | Status: DC | PRN

## 2018-12-21 MED ORDER — ALUM & MAG HYDROXIDE-SIMETH 200-200-20 MG/5ML PO SUSP: 30.0000 mL | ORAL | Status: DC | PRN

## 2018-12-21 MED ORDER — ONDANSETRON HCL 8 MG PO TABS: 4.0000 mg | ORAL_TABLET | Freq: Three times a day (TID) | ORAL | Status: DC | PRN

## 2018-12-21 NOTE — ED Provider Notes (Signed)
I received the patient in signout from Dr. Roxanne Mins, briefly the patient is a 23 year old female with attempted suicide by slitting her wrists.  Plan is for TTS evaluation.  TTS recommends inpatient treatment.   Deno Etienne, DO 12/21/18 1020

## 2018-12-21 NOTE — ED Notes (Signed)
Dr Roxanne Mins in room suturing pt

## 2018-12-21 NOTE — Progress Notes (Signed)
Pt accepted to [facility], bed 405[facility]1  Mordecai Maes, NP is the accepting provider.    Dr. Mallie Darting is the attending provider.    Call report to [facility] ED notified.     Pt is voluntary and can be transported by [facility].    Pt is scheduled to arrive at [facility] at [place], [place], [place] Disposition [facility]/TTS [phone]

## 2018-12-21 NOTE — ED Notes (Signed)
TTS speaking to patient. ?

## 2018-12-21 NOTE — ED Triage Notes (Signed)
Pt brought in by her mother and her roommate  Pt is tearful on arrival  Pt states she cut her right wrist tonight with a knife  Pt has a small laceration noted  Bleeding is controlled  Pt states she is just going through some stuff and did not feel like life tonight  Denies any previous cutting  Pt states she was not trying to kill herself but she has had some suicidal thoughts

## 2018-12-21 NOTE — ED Provider Notes (Signed)
[facility] EMERGENCY DEPARTMENT Provider Note   CSN: [id number] Arrival date & time: 12/21/18  0350   History   Chief Complaint Chief Complaint  Patient presents with  . Suicidal    HPI Tabitha Hawkins is a 23 y.o. female.   The history is provided by the patient.  She has history of attention deficit disorder, asthma and comes in after cutting her right wrist at home.  She does admit to suicidal intent.  She has been depressed for about the last 2 years and has had suicidal thoughts during that time, but this the first time she has acted on those thoughts.  She does admit to having 4-5 beers tonight and smoking marijuana.  She does admit to crying spells, early morning awakening, and anhedonia.  She denies hallucinations.  Past Medical History:  Diagnosis Date  . ADHD (attention deficit hyperactivity disorder)   . H/O multiple concussions    seen by neuro  . Headache    frequent  . Short-term memory loss    cause from multiple concussions?    Patient Active Problem List   Diagnosis Date Noted  . Asthma 02/09/2017  . Anxiety and depression 07/30/2016  . PCP NOTES >>>>> 06/02/2015  . Nephropyelitis 07/12/2013  . Allergic rhinitis 04/14/2012  . Annual physical exam 12/27/2011  .  ADD (attention deficit disorder) 12/27/2011    Past Surgical History:  Procedure Laterality Date  . KNEE ARTHROSCOPY Left 2016     OB History   No obstetric history on file.      Home Medications    Prior to Admission medications   Medication Sig Start Date End Date Taking? Authorizing Provider  albuterol (VENTOLIN HFA) 108 (90 Base) MCG/ACT inhaler Inhale 2 puffs into the lungs every 6 (six) hours as needed for wheezing or shortness of breath. Patient not taking: Reported on 10/27/2018 02/09/17   Wanda PlumpPaz, Jose E, MD  budesonide-formoterol [facility](SYMBICORT) 160-4.5 MCG/ACT inhaler Inhale 2 puffs into the lungs 2 (two) times daily. 02/09/17   Wanda PlumpPaz, Jose E, MD  etonogestrel (NEXPLANON) 68  MG IMPL implant 1 each by Subdermal route once.    [provider]  FLUoxetine (PROZAC) 20 MG tablet Take 1 tablet (20 mg total) by mouth daily. 07/03/18   Wanda PlumpPaz, Jose E, MD  norgestimate-ethinyl estradiol [facility](ESTARYLLA) 0.25-35 MG-MCG tablet Take 1 tablet by mouth daily.    [provider]  norgestimate-ethinyl estradiol (ORTHO-CYCLEN) 0.25-35 MG-MCG tablet Take 1 tablet by mouth daily.    [provider]    Family History Family History  Adopted: Yes  Problem Relation Age of Onset  . Drug abuse Mother     Social History Social History   Tobacco Use  . Smoking status: Never Smoker  . Smokeless tobacco: Never Used  Substance Use Topics  . Alcohol use: No  . Drug use: No     Allergies   Patient has no known allergies.   Review of Systems Review of Systems  All other systems reviewed and are negative.    Physical Exam Updated Vital Signs BP (!) 135/95 (BP Location: Left Arm)   Pulse (!) 102   Temp 98.3 F (36.8 C) (Oral)   Resp 18   Ht 5\' 9"  (1.753 m)   Wt 83.9 kg   LMP 12/07/2018 (Approximate)   SpO2 98%   BMI 27.32 kg/m   Physical Exam Vitals signs and nursing note reviewed.    23 year old female, resting comfortably and in no acute  distress. Vital signs are significant for elevated blood pressure and mildly elevated heart rate. Oxygen saturation is 98%, which is normal. Head is normocephalic and atraumatic. PERRLA, EOMI. Oropharynx is clear. Neck is nontender and supple without adenopathy or JVD. Back is nontender and there is no CVA tenderness. Lungs are clear without rales, wheezes, or rhonchi. Chest is nontender. Heart has regular rate and rhythm without murmur. Abdomen is soft, flat, nontender without masses or hepatosplenomegaly and peristalsis is normoactive. Extremities have no cyanosis or edema, full range of motion is present.  Laceration is present on the volar surface of the distal right forearm oriented transversely.   Distal neurovascular and tendon function is normal. Skin is warm and dry without rash. Neurologic: Mental status is normal, cranial nerves are intact, there are no motor or sensory deficits.  ED Treatments / Results  Labs (all labs ordered are listed, but only abnormal results are displayed) Labs Reviewed  ACETAMINOPHEN LEVEL - Abnormal; Notable for the following components:      Result Value   Acetaminophen (Tylenol), Serum <10 (*)    All other components within normal limits  COMPREHENSIVE METABOLIC PANEL - Abnormal; Notable for the following components:   Potassium 3.3 (*)    CO2 19 (*)    All other components within normal limits  ETHANOL - Abnormal; Notable for the following components:   Alcohol, Ethyl (B) 173 (*)    All other components within normal limits  RAPID URINE DRUG SCREEN, HOSP PERFORMED - Abnormal; Notable for the following components:   Tetrahydrocannabinol POSITIVE (*)    All other components within normal limits  SALICYLATE LEVEL  PREGNANCY, URINE    Procedures .Marland Kitchen.Laceration Repair  Date/Time: 12/21/2018 4:51 AM Performed by: Dione BoozeGlick, Syriana Croslin, MD Authorized by: Dione BoozeGlick, Lajuana Patchell, MD   Consent:    Consent obtained:  Verbal   Consent given by:  Patient   Risks discussed:  Infection, pain and poor cosmetic result   Alternatives discussed:  No treatment Anesthesia (see MAR for exact dosages):    Anesthesia method:  Local infiltration   Local anesthetic:  Lidocaine 2% WITH epi Laceration details:    Location:  Shoulder/arm   Shoulder/arm location:  R lower arm   Length (cm):  3.5   Depth (mm):  3 Repair type:    Repair type:  Simple Pre-procedure details:    Preparation:  Patient was prepped and draped in usual sterile fashion Exploration:    Hemostasis achieved with:  Direct pressure   Wound exploration: entire depth of wound probed and visualized     Wound extent: no fascia violation noted, no foreign bodies/material noted, no muscle damage noted, no nerve  damage noted, no tendon damage noted and no vascular damage noted     Contaminated: no   Treatment:    Area cleansed with:  Saline   Amount of cleaning:  Standard Skin repair:    Repair method:  Sutures   Suture size:  4-0   Suture material:  Nylon   Suture technique:  Vertical mattress   Number of sutures:  5 Approximation:    Approximation:  Close Post-procedure details:    Dressing:  Antibiotic ointment and sterile dressing   Patient tolerance of procedure:  Tolerated well, no immediate complications     Medications Ordered in ED Medications  alum & mag hydroxide-simeth (MAALOX/MYLANTA) 200-200-20 MG/5ML suspension 30 mL (has no administration in time range)  ondansetron (ZOFRAN) tablet 4 mg (has no administration in time range)  acetaminophen (TYLENOL) tablet  650 mg (has no administration in time range)  potassium chloride SA (K-DUR) CR tablet 40 mEq (has no administration in time range)  lidocaine-EPINEPHrine (XYLOCAINE W/EPI) 2 %-1:200000 (PF) injection 10 mL (10 mLs Infiltration Given 12/21/18 0428)     Initial Impression / Assessment and Plan / ED Course  I have reviewed the triage vital signs and the nursing notes.  Pertinent lab results that were available during my care of the patient were reviewed by me and considered in my medical decision making (see chart for details).  Self-inflicted laceration of the right wrist.  Laceration is closed with sutures.  Screening labs are obtained and TTS consultation is requested.  Old records are reviewed, and she has no relevant past visits.  Labs show mild hypokalemia and she is given a dose of oral potassium.  Ethanol level was 173 indicating mild alcohol intoxication.  TTS consultation is pending.  Final Clinical Impressions(s) / ED Diagnoses   Final diagnoses:  Suicidal ideation  Laceration of skin of right wrist, initial encounter  Alcohol intoxication, uncomplicated ([place])  Hypokalemia    ED Discharge Orders    None        Delora Fuel, MD 52/84/13 [phone]

## 2018-12-21 NOTE — ED Notes (Signed)
Pt placed in paper scrubs and wanded by security  Pt belongings locked in cabinet at nursing station

## 2018-12-21 NOTE — Progress Notes (Signed)
Patient ID: Tabitha Hawkins, female   DOB: September 25, 1995, 23 y.o.   MRN: [id number] Admission note  Pt is a 23 yo female that presents voluntarily on 12/21/2018 with worsening depression over financial issues. Pt states she had suicidal ideations and cut her wrist because of this. 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 Pt states she uses cannabis and drinks socially. Pt uses e-cigarettes. Pt admits to being sexually active and has an implant. Pt has a hx of asthma and 6 concussions. Pt states she took fluoxetine for two years and was helping. Pt denies any present/past physical/verbal/sexual abuse. Pt denies si/hi/ah/vh at this time and verbally agrees to approach staff if these become apparent or before harming herself/others while at [facility]. Pt safe on the unit. q69m safety checks implemented and continued.  Consents signed, skin/belongings search completed and patient oriented to unit. Patient stable at this time. Patient given the opportunity to express concerns and ask questions. Patient given toiletries. Will continue to monitor.

## 2018-12-21 NOTE — ED Notes (Signed)
Awaiting inpatient placement.  Dr. Tyrone Nine notified.

## 2018-12-21 NOTE — Tx Team (Signed)
Initial Treatment Plan 12/21/2018 10:41 PM Tabitha Hawkins EUM:[id number]    PATIENT STRESSORS: Financial difficulties Medication change or noncompliance   PATIENT STRENGTHS: Ability for insight Active sense of humor Average or above average intelligence Capable of independent living Communication skills General fund of knowledge Motivation for treatment/growth Physical Health   PATIENT IDENTIFIED PROBLEMS: "depression"  "suicidal thoughts"  "financial difficulties"                 DISCHARGE CRITERIA:  Ability to meet basic life and health needs Improved stabilization in mood, thinking, and/or behavior Medical problems require only outpatient monitoring  PRELIMINARY DISCHARGE PLAN: Attend aftercare/continuing care group Attend PHP/IOP Outpatient therapy Return to previous living arrangement  PATIENT/FAMILY INVOLVEMENT: This treatment plan has been presented to and reviewed with the patient, Tabitha Hawkins.  The patient and family have been given the opportunity to ask questions and make suggestions.  Baron Sane, RN 12/21/2018, 10:41 PM

## 2018-12-21 NOTE — BH Assessment (Signed)
Tele Assessment Note   Patient Name: Tabitha Hawkins MRN: [id number] Referring Physician: Dr. Dione Boozeavid Glick, MD Location of Patient: [facility] Location of Provider: Behavioral Health TTS Department  Tabitha Hawkins is a 23 y.o. female [facility] her wrist in a suicide attempt.  Pt stated "2 years ago I was diagnosed with depression. I started having suicidal thoughts 2 years but last night was the first time I ever acted on it."  Pt admits to substance use since the age of [age]. Pt reports using Alcohol: "4-beers every few days"; last used PTA.  Cannabis use "2 blunts daily" last used PTA.  Pt denies HI/A/V-hallucinations.  Pt resides with her roommate/bestfriend since October 2019 and can return.  Pt is currently employed.  Pt reports receiving outpatient counseling to treat depression with Gala LewandowskyJessica Brown and states she has not been treated in over a year.  Pt reports receiving medication management for her depression with her PCP, Dr [name].  Pt denies a history of inpatient MH treatment.  Pt denies a history of physical, sexual and verbal abuse.  Patient was wearing scrubs and appeared appropriately groomed.  Pt was alert throughout the assessment.  Patient made fair eye contact and had normal psychomotor activity.  Patient spoke in a normal voice without pressured speech.  Pt expressed feeling sad.  Pt's affect appeared dysphoric and congruent with stated mood. Pt's thought process was coherent and logical.  Pt presented with partial insight and judgement.  Pt did not appear to be responding to internal stimuli.  Pt was not able to contract for safety.  [facility] with [facility] Provider, Denzil MagnusonLaShunda Thomas, NP who recommends inpatient treatment.  TTS will look for placement  Diagnosis: F33.2 Major Depressive Disorder, Severe  Past Medical History:  Past Medical History:  Diagnosis Date  . ADHD (attention deficit hyperactivity  disorder)   . H/O multiple concussions    seen by neuro  . Headache    frequent  . Short-term memory loss    cause from multiple concussions?    Past Surgical History:  Procedure Laterality Date  . BUNIONECTOMY    . KNEE ARTHROSCOPY Left 2016    Family History:  Family History  Adopted: Yes  Problem Relation Age of Onset  . Drug abuse Mother     Social History:  reports that she has never smoked. She has never used smokeless tobacco. She reports current alcohol use. She reports current drug use. Drug: Marijuana.  Additional Social History:  Alcohol / Drug Use Pain Medications: See MARs Prescriptions: See MARs Over the Counter: See MARs History of alcohol / drug use?: Yes Longest period of sobriety (when/how long): a few days with drinking Substance #1 Name of Substance 1: Alcohol 1 - Age of First Use: 21 1 - Amount (size/oz): 4- beers 1 - Frequency: every few days 1 - Duration: ongoing 1 - Last Use / Amount: PTA Substance #2 Name of Substance 2: Cannabis 2 - Age of First Use: 20 2 - Amount (size/oz): 2 blunts 2 - Frequency: daily 2 - Duration: ongoing 2 - Last Use / Amount: PTA  CIWA: CIWA-Ar BP: (!) 135/95 Pulse Rate: (!) 102 COWS:    Allergies: No Known Allergies  Home Medications: (Not in a hospital admission)   OB/GYN Status:  Patient's last menstrual period was 12/07/2018 (approximate).  General Assessment Data Location of Assessment: [facility] TTS Assessment: In system Is this a  Tele or Face-to-Face Assessment?: Tele Assessment Is this an Initial Assessment or a Re-assessment for this encounter?: Initial Assessment Patient Accompanied by:: N/A Language Other than English: No Living Arrangements: Other (Comment)(Bestfriend) What gender do you identify as?: Female Marital status: Single Maiden name: Hawkins Pregnancy Status: Unknown Living Arrangements: Non-relatives/Friends Can pt return to current living arrangement?:  Yes Admission Status: Voluntary Is patient capable of signing voluntary admission?: Yes Referral Source: Self/Family/Friend     Crisis Care Plan Living Arrangements: Non-relatives/Friends Name of Psychiatrist: Dr Paz(PCP dx pt with depression and tx with med) Name of Therapist: Janett Billow Brown(Have not seen counselor in over a year)  Education Status Is patient currently in school?: No Is the patient employed, 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 : Yes Suicidal Intent: Yes-Currently Present Has patient had any suicidal intent within the past 6 months prior to admission? : Yes Is patient at risk for suicide?: Yes Suicidal Plan?: Yes-Currently Present Has patient had any suicidal plan within the past 6 months prior to admission? : No Specify Current Suicidal Plan: Cut wrist Access to Means: Yes Previous Attempts/Gestures: No Triggers for Past Attempts: None known Intentional Self Injurious Behavior: None Family Suicide History: Unknown(Pt is adopted) Recent stressful life event(s): Financial Problems Persecutory voices/beliefs?: No Depression: Yes Depression Symptoms: Insomnia, Isolating, Feeling worthless/self pity Substance abuse history and/or treatment for substance abuse?: No Suicide prevention information given to non-admitted patients: Yes  Risk to Others within the past 6 months Homicidal Ideation: No Does patient have any lifetime risk of violence toward others beyond the six months prior to admission? : No Thoughts of Harm to Others: No Current Homicidal Intent: No Current Homicidal Plan: No Access to Homicidal Means: No History of harm to others?: No Assessment of Violence: None Noted Does patient have access to weapons?: No(knives) Criminal Charges Pending?: No Does patient have  a court date: No Is patient on probation?: No  Psychosis Hallucinations: None noted Delusions: None noted  Mental Status Report Appearance/Hygiene: In scrubs Eye Contact: Fair Motor Activity: Freedom of movement Speech: Logical/coherent Level of Consciousness: Alert Mood: Depressed Affect: Appropriate to circumstance, Depressed Anxiety Level: None Thought Processes: Coherent Judgement: Partial Orientation: Person, Place, Time, Appropriate for developmental age Obsessive Compulsive Thoughts/Behaviors: None  Cognitive Functioning Concentration: Normal Memory: Recent Intact, Remote Intact Is patient IDD: No Insight: Good Impulse Control: Poor Appetite: Fair Have you had any weight changes? : No Change Sleep: No Change Total Hours of Sleep: 7 Vegetative Symptoms: None  ADLScreening [facility] Assessment Services) Patient's cognitive ability adequate to safely complete daily activities?: Yes Patient able to express need for assistance with ADLs?: Yes Independently performs ADLs?: Yes (appropriate for developmental age)  Prior Inpatient Therapy Prior Inpatient Therapy: No  Prior Outpatient Therapy Prior Outpatient Therapy: Yes Prior Therapy Dates: 2018 Prior Therapy Facilty/Provider(s): [facility] Reason for Treatment: Depression Does patient have an ACCT team?: No Does patient have Intensive In-House Services?  : No Does patient have [facility] services? : No Does patient have P4CC services?: No  ADL Screening (condition at time of admission) Patient's cognitive ability adequate to safely complete daily activities?: Yes Is the patient deaf or have difficulty hearing?: No Does the patient have difficulty seeing, even when wearing glasses/contacts?: No Does the patient have difficulty concentrating, remembering, or making decisions?: No Patient able to express need for assistance with  ADLs?: Yes Does the patient have difficulty dressing or bathing?: No Independently  performs ADLs?: Yes (appropriate for developmental age) Does the patient have difficulty walking or climbing stairs?: No Weakness of Legs: None Weakness of Arms/Hands: None  Home Assistive Devices/Equipment Home Assistive Devices/Equipment: None    Abuse/Neglect Assessment (Assessment to be complete while patient is alone) Abuse/Neglect Assessment Can Be Completed: Yes Physical Abuse: Denies Verbal Abuse: Denies Sexual Abuse: Denies Exploitation of patient/patient's resources: Denies Self-Neglect: Denies Values / Beliefs Cultural Requests During Hospitalization: None Spiritual Requests During Hospitalization: None   Advance Directives (For Healthcare) Does Patient Have a Medical Advance Directive?: No Would patient like information on creating a medical advance directive?: No - Patient declined Nutrition Screen- [facility] Adult/WL/AP Patient's home diet: NPO        [facility] with [facility] Provider, Denzil MagnusonLaShunda Thomas, NP who recommends inpatient treatment.  TTS will look for placement  Disposition Initial Assessment Completed for this Encounter: Yes Disposition of Patient: (Pending disposition)  This service was provided via telemedicine using a 2-way, interactive audio and video technology.  Names of all persons participating in this telemedicine service and their role in this encounter. Name: Tabitha Hawkins Role: Patient  Name: Bence Trapp L. Juanantonio Stolar, MS, [facility], NCC Role: Triage Specialist  Name: Denzil MagnusonLaShunda Thomas, NP Role: [facility]  Name:  Role:     Tyron RussellChristel L Quiera Diffee, MS, [facility], NCC 12/21/2018 8:23 AM

## 2018-12-21 NOTE — [facility] Counselor (Signed)
  BH ASSESSMENT Disposition:  [facility] discussed case with BH Provider, LaShunda Thomas, NP who recommends inpatient treatment. TTS will look for placement.  Jodette Wik L. Ruel Dimmick, MS, [facility], NCC Therapeutic Triage Specialist  [phone]    

## 2018-12-21 NOTE — ED Notes (Signed)
Spoke with pt's family and updated them on pt status and plan of care

## 2018-12-21 NOTE — ED Notes (Signed)
Pt. Eating at present time with no other needs

## 2018-12-22 LAB — TSH: TSH: 3.452 u[IU]/mL (ref 0.350–4.500)

## 2018-12-22 LAB — LIPID PANEL
Cholesterol: 207 mg/dL — ABNORMAL HIGH (ref 0–200)
HDL: 106 mg/dL (ref >40)
LDL Cholesterol: 83 mg/dL (ref 0–99)
Total CHOL/HDL Ratio: 2 RATIO
Triglycerides: 89 mg/dL (ref <150)
VLDL: 18 mg/dL (ref 0–40)

## 2018-12-22 LAB — HEMOGLOBIN A1C
Hgb A1c MFr Bld: 4.3 % — ABNORMAL LOW (ref 4.8–5.6)
Mean Plasma Glucose: 76.71 mg/dL

## 2018-12-22 MED ORDER — FLUOXETINE HCL 20 MG PO CAPS
40.0000 mg | ORAL_CAPSULE | Freq: Every day | ORAL | Status: DC
  Administered 2018-12-22 – 2018-12-25 (×4): 40 mg via ORAL
  Filled 2018-12-22 (×7): qty 2

## 2018-12-22 MED ORDER — ALBUTEROL SULFATE HFA 108 (90 BASE) MCG/ACT IN AERS: 1.0000 | INHALATION_SPRAY | Freq: Four times a day (QID) | RESPIRATORY_TRACT | Status: DC | PRN

## 2018-12-22 MED ORDER — CHLORDIAZEPOXIDE HCL 25 MG PO CAPS: 25.0000 mg | ORAL_CAPSULE | Freq: Four times a day (QID) | ORAL | Status: DC | PRN

## 2018-12-22 MED ORDER — VITAMIN B-1 100 MG PO TABS
100.0000 mg | ORAL_TABLET | Freq: Every day | ORAL | Status: DC
  Administered 2018-12-22 – 2018-12-25 (×4): 100 mg via ORAL
  Filled 2018-12-22 (×6): qty 1

## 2018-12-22 MED ORDER — FOLIC ACID 1 MG PO TABS
1.0000 mg | ORAL_TABLET | Freq: Every day | ORAL | Status: DC
  Administered 2018-12-22 – 2018-12-25 (×4): 1 mg via ORAL
  Filled 2018-12-22 (×6): qty 1

## 2018-12-22 MED ORDER — IBUPROFEN 400 MG PO TABS: 400.0000 mg | ORAL_TABLET | Freq: Four times a day (QID) | ORAL | Status: DC | PRN

## 2018-12-22 NOTE — Progress Notes (Signed)
[place] NOVEL CORONAVIRUS (COVID-19) DAILY CHECK-OFF SYMPTOMS - answer yes or no to each - every day NO YES  Have you had a fever in the past 24 hours?  . Fever (Temp > 37.80C / 100F) X   Have you had any of these symptoms in the past 24 hours? . New Cough .  Sore Throat  .  Shortness of Breath .  Difficulty Breathing .  Unexplained Body Aches   X   Have you had any one of these symptoms in the past 24 hours not related to allergies?   . Runny Nose .  Nasal Congestion .  Sneezing   X   If you have had runny nose, nasal congestion, sneezing in the past 24 hours, has it worsened?  X   EXPOSURES - check yes or no X   Have you traveled outside the state in the past 14 days?  X   Have you been in contact with someone with a confirmed diagnosis of COVID-19 or PUI in the past 14 days without wearing appropriate PPE?  X   Have you been living in the same home as a person with confirmed diagnosis of COVID-19 or a PUI (household contact)?    X   Have you been diagnosed with COVID-19?    X              What to do next: Answered NO to all: Answered YES to anything:   Proceed with unit schedule Follow the [facility] Inpatient Flowsheet.   

## 2018-12-22 NOTE — [facility] Counselor (Signed)
Adult Comprehensive Assessment  Patient ID: Tabitha Hawkins, female   DOB: 03/17/1996, 23 y.o.   MRN: [id number]  Information Source: Information source: Patient  Current Stressors:  Patient states their primary concerns and needs for treatment are:: "My depression and how I have been handling it" Patient states their goals for this hospitilization and ongoing recovery are:: "Figure out different coping skills" Educational / Learning stressors: N/A Employment / Job issues: Employed; Patient denies any current stressors Family Relationships: Patient denies any current stressors Financial / Lack of resources (include bankruptcy): Patient reports having financial strain Housing / Lack of housing: [place] Physical health (include injuries & life threatening diseases): Patient denies any current stressors Social relationships: Patient denies any current stressors Substance abuse: Patient reports smoking cannabis occassionally Bereavement / Loss: Patient denies any current stressors  Living/Environment/Situation:  Living Arrangements: Non-relatives/Friends Living conditions (as described by patient or guardian): "good" Who else lives in the home?: Best friend How long has patient lived in current situation?: Since October 2019 What is atmosphere in current home: Comfortable  Family History:  Marital status: Single(Reports she recently started a new relationship) Are you sexually active?: Yes What is your sexual orientation?: Heterosexual Has your sexual activity been affected by drugs, alcohol, medication, or emotional stress?: No Does patient have children?: No  Childhood History:  By whom was/is the patient raised?: Adoptive parents Additional childhood history information: Reports being adopted at 2611 months old. Description of patient's relationship with caregiver when they were a child: Patient reports having an "awesome" relationship with  her parents during her childhood. Patient's description of current relationship with people who raised him/her: Patient reports she continues to have an "awesome" relationship with her parents. How were you disciplined when you got in trouble as a child/adolescent?: Verbally Does patient have siblings?: Yes Number of Siblings: 3 Description of patient's current relationship with siblings: Patient reports having a good relationship with her three siblings Did patient suffer any verbal/emotional/physical/sexual abuse as a child?: No Did patient suffer from severe childhood neglect?: No Has patient ever been sexually abused/assaulted/raped as an adolescent or adult?: No Was the patient ever a victim of a crime or a disaster?: No Witnessed domestic violence?: No Has patient been effected by domestic violence as an adult?: No  Education:  Highest grade 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: 12th grade Currently a student?: No Learning disability?: No  Employment/Work Situation:   Employment situation: Employed Where is patient currently employed?: [employer] How long has patient been employed?: 3 years Patient's job has been impacted by current illness: No What is the longest time patient has a held a job?: 3 years Where was the patient employed at that time?: Current job Did You Receive Any Psychiatric Treatment/Services While in [employer]?: No Are There Guns or Other Weapons in Your Home?: No  Financial Resources:   Financial resources: Income from employment, Private insurance Does patient have a representative payee or guardian?: No  Alcohol/Substance Abuse:   What has been your use of drugs/alcohol within the last 12 months?: Cannabis; Daily If attempted suicide, did drugs/alcohol play a role in this?: No Alcohol/Substance Abuse Treatment Hx: Denies past history Has alcohol/substance abuse ever caused legal problems?: No  Social Support System:   Patient's Community Support System:  Good Describe Community Support System: "Family and friends" Type of faith/religion: Christianity How does patient's faith help to cope with current illness?: Prayer  Leisure/Recreation:   Leisure and Hobbies: "  Not really"  Strengths/Needs:   What is the patient's perception of their strengths?: "I ike to make people laugh" Patient states they can use these personal strengths during their treatment to contribute to their recovery: Yes Patient states these barriers may affect/interfere with their treatment: No Patient states these barriers may affect their return to the community: No Other important information patient would like considered in planning for their treatment: No  Discharge Plan:   Currently receiving community mental health services: No Patient states concerns and preferences for aftercare planning are: Outpatient medication management and therapy services Patient states they will know when they are safe and ready for discharge when: To be determined Does patient have access to transportation?: Yes Does patient have financial barriers related to discharge medications?: No Will patient be returning to same living situation after discharge?: Yes  Summary/Recommendations:   Summary and Recommendations (to be completed by the evaluator): [name] is a 23 year old female who is diagnosed with MDD (major depressive disorder). She presented to the hospital seeking treatment for suicidal ideation. During the assessment, [name] was pleasant and cooperative with providing information. [name] reports that she came to the hospital after cutting her wrist in a suicide attempt. She reports that this was her first time cutting and that she does not know why she is depressed. [name] expressed interest in outpatient medication management and therapy referrals at discharge. [name] can benefit from crisis stabilization, medication management, therapeutic milieu and referral services.  [name].  12/22/2018

## 2018-12-22 NOTE — Tx Team (Signed)
Interdisciplinary Treatment and Diagnostic Plan Update  12/22/2018 Time of Session:  Tabitha Hawkins MRN: [id number]  Principal Diagnosis: <principal problem not specified>  Secondary Diagnoses: Active Problems:   MDD (major depressive disorder)   Current Medications:  Current Facility-Administered Medications  Medication Dose Route Frequency Provider Last Rate Last Dose  . acetaminophen (TYLENOL) tablet 650 mg  650 mg Oral Q6H PRN Mordecai Maes, NP   650 mg at 12/21/18 2158  . albuterol (VENTOLIN HFA) 108 (90 Base) MCG/ACT inhaler 1-2 puff  1-2 puff Inhalation Q6H PRN Sharma Covert, MD      . alum & mag hydroxide-simeth (MAALOX/MYLANTA) 200-200-20 MG/5ML suspension 30 mL  30 mL Oral Q4H PRN Mordecai Maes, NP      . chlordiazePOXIDE (LIBRIUM) capsule 25 mg  25 mg Oral QID PRN Sharma Covert, MD      . FLUoxetine (PROZAC) capsule 40 mg  40 mg Oral Daily Sharma Covert, MD   40 mg at 12/22/18 7989  . folic acid (FOLVITE) tablet 1 mg  1 mg Oral Daily Sharma Covert, MD      . hydrOXYzine (ATARAX/VISTARIL) tablet 25 mg  25 mg Oral TID PRN [name], NP   25 mg at 12/21/18 2243  . ibuprofen (ADVIL) tablet 400 mg  400 mg Oral Q6H PRN Sharma Covert, MD      . thiamine (VITAMIN B-1) tablet 100 mg  100 mg Oral Daily Sharma Covert, MD      . traZODone (DESYREL) tablet 50 mg  50 mg Oral QHS,MR X 1 Lindon Romp A, NP   50 mg at 12/21/18 2242   PTA Medications: Medications Prior to Admission  Medication Sig Dispense Refill Last Dose  . albuterol (VENTOLIN HFA) 108 (90 Base) MCG/ACT inhaler Inhale 2 puffs into the lungs every 6 (six) hours as needed for wheezing or shortness of breath. (Patient not taking: Reported on 10/27/2018) 1 Inhaler 1   . budesonide-formoterol (SYMBICORT) 160-4.5 MCG/ACT inhaler Inhale 2 puffs into the lungs 2 (two) times daily. 1 Inhaler 3   . etonogestrel (NEXPLANON) 68 MG IMPL implant 1 each by Subdermal route once.     Marland Kitchen FLUoxetine  (PROZAC) 20 MG tablet Take 1 tablet (20 mg total) by mouth daily. 30 tablet 6   . norgestimate-ethinyl estradiol (ESTARYLLA) 0.25-35 MG-MCG tablet Take 1 tablet by mouth daily.     . norgestimate-ethinyl estradiol (ORTHO-CYCLEN) 0.25-35 MG-MCG tablet Take 1 tablet by mouth daily.       Patient Stressors: Financial difficulties Medication change or noncompliance  Patient Strengths: Ability for insight Active sense of humor Average or above average intelligence Capable of independent living Communication skills General fund of knowledge Motivation for treatment/growth Physical Health  Treatment Modalities: Medication Management, Group therapy, Case management,  1 to 1 session with clinician, Psychoeducation, Recreational therapy.   Physician Treatment Plan for Primary Diagnosis: <principal problem not specified> Long Term Goal(s):     Short Term Goals:    Medication Management: Evaluate patient's response, side effects, and tolerance of medication regimen.  Therapeutic Interventions: 1 to 1 sessions, Unit Group sessions and Medication administration.  Evaluation of Outcomes: Not Met  Physician Treatment Plan for Secondary Diagnosis: Active Problems:   MDD (major depressive disorder)  Long Term Goal(s):     Short Term Goals:       Medication Management: Evaluate patient's response, side effects, and tolerance of medication regimen.  Therapeutic Interventions: 1 to 1 sessions, Unit Group sessions and  Medication administration.  Evaluation of Outcomes: Not Met   RN Treatment Plan for Primary Diagnosis: <principal problem not specified> Long Term Goal(s): Knowledge of disease and therapeutic regimen to maintain health will improve  Short Term Goals: Ability to participate in decision making will improve, Ability to verbalize feelings will improve, Ability to disclose and discuss suicidal ideas, Ability to identify and develop effective coping behaviors will improve and  Compliance with prescribed medications will improve  Medication Management: RN will administer medications as ordered by provider, will assess and evaluate patient's response and provide education to patient for prescribed medication. RN will report any adverse and/or side effects to prescribing provider.  Therapeutic Interventions: 1 on 1 counseling sessions, Psychoeducation, Medication administration, Evaluate responses to treatment, Monitor vital signs and CBGs as ordered, Perform/monitor CIWA, COWS, AIMS and Fall Risk screenings as ordered, Perform wound care treatments as ordered.  Evaluation of Outcomes: Not Met   LCSW Treatment Plan for Primary Diagnosis: <principal problem not specified> Long Term Goal(s): Safe transition to appropriate next level of care at discharge, Engage patient in therapeutic group addressing interpersonal concerns.  Short Term Goals: Engage patient in aftercare planning with referrals and resources  Therapeutic Interventions: Assess for all discharge needs, 1 to 1 time with Social worker, Explore available resources and support systems, Assess for adequacy in community support network, Educate family and significant other(s) on suicide prevention, Complete Psychosocial Assessment, Interpersonal group therapy.  Evaluation of Outcomes: Not Met   Progress in Treatment: Attending groups: No. Participating in groups: No. Taking medication as prescribed: Yes. Toleration medication: Yes. Family/Significant other contact made: No, will contact:  if patient consents to collateral contacts Patient understands diagnosis: Yes. Discussing patient identified problems/goals with staff: Yes. Medical problems stabilized or resolved: Yes. Denies suicidal/homicidal ideation: Yes. Issues/concerns per patient self-inventory: No. Other:   New problem(s) identified: None   New Short Term/Long Term Goal(s):medication stabilization, elimination of SI thoughts, development of  comprehensive mental wellness plan.    Patient Goals:    Discharge Plan or Barriers: Patient recently admitted. CSW will continue to follow and assess for appropriate referrals and possible discharge planning.    Reason for Continuation of Hospitalization: Anxiety Depression Medication stabilization Suicidal ideation  Estimated Length of Stay: 3-5 days   Attendees: Patient: 12/22/2018 11:04 AM  Physician: Dr. Myles Lipps, MD 12/22/2018 11:04 AM  Nursing: Chong Sicilian.D, RN 12/22/2018 11:04 AM  RN Care Manager: 12/22/2018 11:04 AM  Social Worker: Radonna Ricker, [place] 12/22/2018 11:04 AM  Recreational Therapist:  12/22/2018 11:04 AM  Other:  12/22/2018 11:04 AM  Other:  12/22/2018 11:04 AM  Other: 12/22/2018 11:04 AM    Scribe for Treatment Team: [name], [place] 12/22/2018 11:04 AM

## 2018-12-22 NOTE — [facility] Suicide Risk Assessment (Signed)
[facility] Admission Suicide Risk Assessment   Nursing information obtained from:  Patient Demographic factors:  Caucasian, Adolescent or young adult Current Mental Status:  Suicidal ideation indicated by patient, Self-harm behaviors, Suicide plan, Intention to act on suicide plan, Plan includes specific time, place, or method, Belief that plan would result in death Loss Factors:  Decline in physical health Historical Factors:  Prior suicide attempts, Impulsivity Risk Reduction Factors:  Positive social support, Positive therapeutic relationship, Sense of responsibility to family, Positive coping skills or problem solving skills  Total Time spent with patient: 30 minutes Principal Problem: <principal problem not specified> Diagnosis:  Active Problems:   MDD (major depressive disorder)  Subjective Data: Patient is seen and examined.  Patient is a 23 year old female who was transferred from the [facility] emergency department on 01/08/2019 after the patient presented there with suicidal ideation.  The patient came in after cutting her right wrist at home.  The patient stated she was unable to really cite any new additional stressors.  She stated that she had been suicidal for a while.  She denied any particular interactions that led to her cutting herself the night before.  She admitted to cannabis use and social alcohol.  She did admit that she had been prescribed fluoxetine 20 mg p.o. daily and felt as though it had been helpful up until recently.  She denied any previous psychiatric admissions, she denied any previous evaluations by psychiatry.  She did admit to previously having had therapy.  She was admitted to the hospital for evaluation and stabilization.  Continued Clinical Symptoms:  Alcohol Use Disorder Identification Test Final Score (AUDIT): 0 The "Alcohol Use Disorders Identification Test", Guidelines for Use in Primary Care, Second Edition.  World Pharmacologist [facility]). Score  between 0-7:  no or low risk or alcohol related problems. Score between 8-15:  moderate risk of alcohol related problems. Score between 16-19:  high risk of alcohol related problems. Score 20 or above:  warrants further diagnostic evaluation for alcohol dependence and treatment.   CLINICAL FACTORS:   Depression:   Anhedonia Hopelessness Impulsivity Insomnia   Musculoskeletal: Strength & Muscle Tone: within normal limits Gait & Station: normal Patient leans: N/A  Psychiatric Specialty Exam: Physical Exam  Nursing note and vitals reviewed. Constitutional: She is oriented to person, place, and time. She appears well-developed and well-nourished.  HENT:  Head: Normocephalic and atraumatic.  Respiratory: Effort normal.  Neurological: She is alert and oriented to person, place, and time.    ROS  Blood pressure 107/89, pulse 67, temperature 98.2 F (36.8 C), temperature source Oral, resp. rate 16, height 5\' 7"  (1.702 m), weight 80.7 kg, last menstrual period 12/07/2018, SpO2 100 %.Body mass index is 27.88 kg/m.  General Appearance: Casual  Eye Contact:  Fair  Speech:  Normal Rate  Volume:  Normal  Mood:  Anxious  Affect:  Congruent  Thought Process:  Coherent and Descriptions of Associations: Intact  Orientation:  Full (Time, Place, and Person)  Thought Content:  Logical  Suicidal Thoughts:  Yes.  without intent/plan  Homicidal Thoughts:  No  Memory:  Immediate;   Fair Recent;   Fair Remote;   Fair  Judgement:  Intact  Insight:  Fair  Psychomotor Activity:  Increased  Concentration:  Concentration: Fair and Attention Span: Fair  Recall:  [employer] of Knowledge:  Good  Language:  Good  Akathisia:  Negative  Handed:  Right  AIMS (if indicated):     Assets:  Desire for Improvement Resilience  ADL's:  Intact  Cognition:  WNL  Sleep:  Number of Hours: 6.75      COGNITIVE FEATURES THAT CONTRIBUTE TO RISK:  None    SUICIDE RISK:   Mild:  Suicidal ideation of  limited frequency, intensity, duration, and specificity.  There are no identifiable plans, no associated intent, mild dysphoria and related symptoms, good self-control (both objective and subjective assessment), few other risk factors, and identifiable protective factors, including available and accessible social support.  PLAN OF CARE: Patient is seen and examined.  Patient is a 23 year old female with a probable past psychiatric history significant for major depression [facility] facility for evaluation and stabilization.  She will be admitted to the hospital.  She will be integrated into the milieu.  She will be encouraged to attend groups for coping skills training.  She has been in therapy in the past, and after discharge she will be referred back for therapy.  She will have her Prozac to 40 mg p.o. daily.  She denied any previous side effects to this and thought it was helpful in the past.  Review of her laboratories revealed a mildly low potassium at 3.3, negative pregnancy test, a blood alcohol of 173, and drug screen positive for marijuana.  Clearly she was intoxicated at the time of the injury, and she will be monitored for alcohol withdrawal symptoms as well.  I certify that inpatient services furnished can reasonably be expected to improve the patient's condition.   Antonieta PertGreg Lawson Yelitza Reach, MD 12/22/2018, 10:12 AM

## 2018-12-22 NOTE — H&P (Signed)
Psychiatric Admission Assessment Adult  Patient Identification: Tabitha Hawkins A Armbrister MRN:  [id number] Date of Evaluation:  12/22/2018 Chief Complaint:  MDD Principal Diagnosis: <principal problem not specified> Diagnosis:  Active Problems:   MDD (major depressive disorder)  History of Present Illness: Patient is seen and examined.  Patient is a 23 year old female who was transferred from the [facility] department on 01/08/2019 after the patient presented there with suicidal ideation.  The patient came in after cutting her right wrist at home.  The patient stated she was unable to really cite any new additional stressors.  She stated that she had been suicidal for a while.  She denied any particular interactions that led to her cutting herself the night before.  She admitted to cannabis use and social alcohol.  She did admit that she had been prescribed fluoxetine 20 mg p.o. daily and felt as though it had been helpful up until recently.  She denied any previous psychiatric admissions, she denied any previous evaluations by psychiatry.  She did admit to previously having had therapy.  She was admitted to the hospital for evaluation and stabilization.  Associated Signs/Symptoms: Depression Symptoms:  depressed mood, anhedonia, insomnia, psychomotor retardation, difficulty concentrating, hopelessness, suicidal thoughts without plan, anxiety, loss of energy/fatigue, disturbed sleep, (Hypo) Manic Symptoms:  Impulsivity, Anxiety Symptoms:  Excessive Worry, Psychotic Symptoms:  denied PTSD Symptoms: Negative Total Time spent with patient: 30 minutes  Past Psychiatric History: She denied any previous psychiatric evaluations, she has been prescribed fluoxetine from her family physician for depression.  She has been in therapy.  Is the patient at risk to self? Yes.    Has the patient been a risk to self in the past 6 months? Yes.    Has the patient been a risk to self within the  distant past? No.  Is the patient a risk to others? No.  Has the patient been a risk to others in the past 6 months? No.  Has the patient been a risk to others within the distant past? No.   Prior Inpatient Therapy:   Prior Outpatient Therapy:    Alcohol Screening: 1. How often do you have a drink containing alcohol?: Never 2. How many drinks containing alcohol do you have on a typical day when you are drinking?: 1 or 2 3. How often do you have six or more drinks on one occasion?: Never AUDIT-C Score: 0 4. How often during the last year have you found that you were not able to stop drinking once you had started?: Never 5. How often during the last year have you failed to do what was normally expected from you becasue of drinking?: Never 6. How often during the last year have you needed a first drink in the morning to get yourself going after a heavy drinking session?: Never 7. How often during the last year have you had a feeling of guilt of remorse after drinking?: Never 8. How often during the last year have you been unable to remember what happened the night before because you had been drinking?: Never 9. Have you or someone else been injured as a result of your drinking?: No 10. Has a relative or friend or a doctor or another health worker been concerned about your drinking or suggested you cut down?: No Alcohol Use Disorder Identification Test Final Score (AUDIT): 0 Substance Abuse History in the last 12 months:  Yes.   Consequences of Substance Abuse: Negative Previous Psychotropic Medications: Yes  Psychological  Evaluations: No  Past Medical History:  Past Medical History:  Diagnosis Date  . ADHD (attention deficit hyperactivity disorder)   . H/O multiple concussions    seen by neuro  . Headache    frequent  . Short-term memory loss    cause from multiple concussions?    Past Surgical History:  Procedure Laterality Date  . BUNIONECTOMY    . KNEE ARTHROSCOPY Left 2016    Family History:  Family History  Adopted: Yes  Problem Relation Age of Onset  . Drug abuse Mother    Family Psychiatric  History: Mother with reported history of substance abuse.  She is adopted and does not know her full family history. Tobacco Screening:   Social History:  Social History   Substance and Sexual Activity  Alcohol Use Yes   Comment: occ     Social History   Substance and Sexual Activity  Drug Use Yes  . Types: Marijuana    Additional Social History:                           Allergies:  No Known Allergies Lab Results:  Results for orders placed or performed during the hospital encounter of 12/21/18 (from the past 48 hour(s))  Hemoglobin A1c     Status: Abnormal   Collection Time: 12/22/18  6:30 AM  Result Value Ref Range   Hgb A1c MFr Bld 4.3 (L) 4.8 - 5.6 %    Comment: (NOTE) Pre diabetes:          5.7%-6.4% Diabetes:              >6.4% Glycemic control for   <7.0% adults with diabetes    Mean Plasma Glucose 76.71 mg/dL    Comment: Performed at [facility] [address] [place], [place] [address]  Lipid panel     Status: Abnormal   Collection Time: 12/22/18  6:30 AM  Result Value Ref Range   Cholesterol 207 (H) 0 - 200 mg/dL   Triglycerides 89 <150 mg/dL   HDL 106 >40 mg/dL   Total CHOL/HDL Ratio 2.0 RATIO   VLDL 18 0 - 40 mg/dL   LDL Cholesterol 83 0 - 99 mg/dL    Comment:        Total Cholesterol/HDL:CHD Risk Coronary Heart Disease Risk Table                     Men   Women  1/2 Average Risk   3.4   3.3  Average Risk       5.0   4.4  2 X Average Risk   9.6   7.1  3 X Average Risk  23.4   11.0        Use the calculated Patient Ratio above and the CHD Risk Table to determine the patient's CHD Risk.        ATP III CLASSIFICATION (LDL):  <100     mg/dL   Optimal  100-129  mg/dL   Near or Above                    Optimal  130-159  mg/dL   Borderline  160-189  mg/dL   High  >190     mg/dL   Very High Performed  at [facility] [address] [place], [place] [address]   TSH     Status: None   Collection Time:  12/22/18  6:30 AM  Result Value Ref Range   TSH 3.452 0.350 - 4.500 uIU/mL    Comment: Performed by a 3rd Generation assay with a functional sensitivity of <=0.01 uIU/mL. Performed at [facility], [address], [place], [place] [address]     Blood Alcohol level:  Lab Results  Component Value Date   ETH 173 (H) 12/21/2018    Metabolic Disorder Labs:  Lab Results  Component Value Date   HGBA1C 4.3 (L) 12/22/2018   MPG 76.71 12/22/2018   No results found for: PROLACTIN Lab Results  Component Value Date   CHOL 207 (H) 12/22/2018   TRIG 89 12/22/2018   HDL 106 12/22/2018   CHOLHDL 2.0 12/22/2018   VLDL 18 12/22/2018   LDLCALC 83 12/22/2018    Current Medications: Current Facility-Administered Medications  Medication Dose Route Frequency Provider Last Rate Last Dose  . acetaminophen (TYLENOL) tablet 650 mg  650 mg Oral Q6H PRN Denzil Magnuson, NP   650 mg at 12/21/18 2158  . albuterol (VENTOLIN HFA) 108 (90 Base) MCG/ACT inhaler 1-2 puff  1-2 puff Inhalation Q6H PRN Antonieta Pert, MD      . alum & mag hydroxide-simeth (MAALOX/MYLANTA) 200-200-20 MG/5ML suspension 30 mL  30 mL Oral Q4H PRN Denzil Magnuson, NP      . chlordiazePOXIDE (LIBRIUM) capsule 25 mg  25 mg Oral QID PRN Antonieta Pert, MD      . FLUoxetine (PROZAC) capsule 40 mg  40 mg Oral Daily Antonieta Pert, MD   40 mg at 12/22/18 7829  . folic acid (FOLVITE) tablet 1 mg  1 mg Oral Daily Antonieta Pert, MD      . hydrOXYzine (ATARAX/VISTARIL) tablet 25 mg  25 mg Oral TID PRN Jackelyn Poling, NP   25 mg at 12/21/18 2243  . ibuprofen (ADVIL) tablet 400 mg  400 mg Oral Q6H PRN Antonieta Pert, MD      . thiamine (VITAMIN B-1) tablet 100 mg  100 mg Oral Daily Antonieta Pert, MD      . traZODone (DESYREL) tablet 50 mg  50 mg Oral QHS,MR X 1 Nira Conn A,  NP   50 mg at 12/21/18 2242   PTA Medications: Medications Prior to Admission  Medication Sig Dispense Refill Last Dose  . albuterol (VENTOLIN HFA) 108 (90 Base) MCG/ACT inhaler Inhale 2 puffs into the lungs every 6 (six) hours as needed for wheezing or shortness of breath. (Patient not taking: Reported on 10/27/2018) 1 Inhaler 1   . budesonide-formoterol (SYMBICORT) 160-4.5 MCG/ACT inhaler Inhale 2 puffs into the lungs 2 (two) times daily. 1 Inhaler 3   . etonogestrel (NEXPLANON) 68 MG IMPL implant 1 each by Subdermal route once.     Marland Kitchen FLUoxetine (PROZAC) 20 MG tablet Take 1 tablet (20 mg total) by mouth daily. 30 tablet 6   . norgestimate-ethinyl estradiol (ESTARYLLA) 0.25-35 MG-MCG tablet Take 1 tablet by mouth daily.     . norgestimate-ethinyl estradiol (ORTHO-CYCLEN) 0.25-35 MG-MCG tablet Take 1 tablet by mouth daily.       Musculoskeletal: Strength & Muscle Tone: within normal limits Gait & Station: normal Patient leans: N/A  Psychiatric Specialty Exam: Physical Exam  Nursing note and vitals reviewed. Constitutional: She is oriented to person, place, and time. She appears well-developed and well-nourished.  HENT:  Head: Normocephalic and atraumatic.  Respiratory: Effort normal.  Neurological: She is alert and oriented to person, place, and time.    ROS  Blood pressure 107/89, pulse 67, temperature 98.2 F (36.8 C), temperature source Oral, resp. rate 16, height 5\' 7"  (1.702 m), weight 80.7 kg, last menstrual period 12/07/2018, SpO2 100 %.Body mass index is 27.88 kg/m.  General Appearance: Casual  Eye Contact:  Fair  Speech:  Normal Rate  Volume:  Normal  Mood:  Anxious  Affect:  Congruent  Thought Process:  Coherent and Descriptions of Associations: Intact  Orientation:  Full (Time, Place, and Person)  Thought Content:  Logical  Suicidal Thoughts:  No  Homicidal Thoughts:  No  Memory:  Immediate;   Fair Recent;   Fair Remote;   Fair  Judgement:  Intact  Insight:   Fair  Psychomotor Activity:  Normal  Concentration:  Concentration: Fair and Attention Span: Fair  Recall:  F[employer]Fair  Fund of Knowledge:  Good  Language:  Good  Akathisia:  Negative  Handed:  Right  AIMS (if indicated):     Assets:  Desire for Improvement Resilience  ADL's:  Intact  Cognition:  WNL  Sleep:  Number of Hours: 6.75    Treatment Plan Summary: Daily contact with patient to assess and evaluate symptoms and progress in treatment, Medication management and Plan : Patient is seen and examined.  Patient is a 23 year old female with a probable past psychiatric history significant for major depression who was transferred from the med Center emergency department to our facility for evaluation and stabilization.  She will be admitted to the hospital.  She will be integrated into the milieu.  She will be encouraged to attend groups for coping skills training.  She has been in therapy in the past, and after discharge she will be referred back for therapy.  She will have her Prozac to 40 mg p.o. daily.  She denied any previous side effects to this and thought it was helpful in the past.  Review of her laboratories revealed a mildly low potassium at 3.3, negative pregnancy test, a blood alcohol of 173, and drug screen positive for marijuana.  Clearly she was intoxicated at the time of the injury, and she will be monitored for alcohol withdrawal symptoms as well.  Observation Level/Precautions:  15 minute checks  Laboratory:  Chemistry Profile  Psychotherapy:    Medications:    Consultations:    Discharge Concerns:    Estimated LOS:  Other:     Physician Treatment Plan for Primary Diagnosis: <principal problem not specified> Long Term Goal(s): Improvement in symptoms so as ready for discharge  Short Term Goals: Ability to identify changes in lifestyle to reduce recurrence of condition will improve, Ability to verbalize feelings will improve, Ability to disclose and discuss suicidal ideas,  Ability to demonstrate self-control will improve, Ability to identify and develop effective coping behaviors will improve, Ability to maintain clinical measurements within normal limits will improve and Ability to identify triggers associated with substance abuse/mental health issues will improve  Physician Treatment Plan for Secondary Diagnosis: Active Problems:   MDD (major depressive disorder)  Long Term Goal(s): Improvement in symptoms so as ready for discharge  Short Term Goals: Ability to identify changes in lifestyle to reduce recurrence of condition will improve, Ability to verbalize feelings will improve, Ability to disclose and discuss suicidal ideas, Ability to demonstrate self-control will improve, Ability to identify and develop effective coping behaviors will improve, Ability to maintain clinical measurements within normal limits will improve and Ability to identify triggers associated with substance abuse/mental health issues will improve  I certify that inpatient services furnished  can reasonably be expected to improve the patient's condition.    Antonieta PertGreg Lawson Sallie Maker, MD 7/3/202011:07 AM

## 2018-12-22 NOTE — Progress Notes (Signed)
D Pt is observed OOB UAL on the 400 hall today tolerated well. She wears hospital-issue patient scrubs, she endorses a flat, depressed affect. She is cooperative with staff. She is seen socializing with her peers often in the dayroom today.     A She completed her daily assessment and on this she wrote she denied SI today and she rated her depression, hopelessness and anxiety " 0/0/0/", respectively. SHe says she " learned my lesson" and that she will not self injure " ever again".      R Safety in place.

## 2018-12-23 DIAGNOSIS — F329 Major depressive disorder, single episode, unspecified: Principal | ICD-10-CM

## 2018-12-23 NOTE — Progress Notes (Signed)
D: Pt denies SI/HI/AVH. Pt is pleasant and cooperative. Pt visible on the unit this evening talking with peers A: Pt was offered support and encouragement. Pt was given scheduled medications. Pt was encourage to attend groups. Q 15 minute checks were done for safety.  R:Pt attends groups and interacts well with peers and staff. Pt is taking medication. Pt has no complaints.Pt receptive to treatment and safety maintained on unit.

## 2018-12-23 NOTE — Progress Notes (Signed)
[facility]  12/23/2018 12:00 PM Tabitha Hawkins  MRN:  [id number]   Subjective: Tabitha Hawkins reported " I made a stupid decision, and hopeful to discharge tomorrow because of my birthday."  Evaluation:  Jetaime observed resting in bed.  She is awake alert and oriented x3.  She presents pleasant calm and cooperative.  Currently denying suicidal or homicidal ideations.  Denies auditory or visual hallucinations.  Rates her depression 0 out of 10 with 10 being the worst.  States she got stressed and overwhelmed related to finances.  Reports a history of depression.  Reports taking and tolerating Prozac well.  She reports a good appetite.  States she is resting well throughout the night.  Reports participating with daily group sessions with active and engaged participation.  Denies alcohol cravings and/or withdrawal symptoms related to alcohol detox. Reported her parents have been supportive during this admission.  As she states plans to stay with her parents for a few weeks after discharge.  Support, encouragement and reassurance was provided.  Principal Problem: MDD (major depressive disorder) Diagnosis: Principal Problem:   MDD (major depressive disorder)  Total Time spent with patient: 15 minutes  Past Psychiatric History:   Past Medical History:  Past Medical History:  Diagnosis Date  . ADHD (attention deficit hyperactivity disorder)   . H/O multiple concussions    seen by neuro  . Headache    frequent  . Short-term memory loss    cause from multiple concussions?    Past Surgical History:  Procedure Laterality Date  . BUNIONECTOMY    . KNEE ARTHROSCOPY Left 2016   Family History:  Family History  Adopted: Yes  Problem Relation Age of Onset  . Drug abuse Mother    Family Psychiatric  History:  Social History:  Social History   Substance and Sexual Activity  Alcohol Use Yes   Comment: occ     Social History   Substance and Sexual Activity  Drug Use Yes  . Types:  Marijuana    Social History   Socioeconomic History  . Marital status: Single    Spouse name: Not on file  . Number of children: 0  . Years of education: Not on file  . Highest education level: Not on file  Occupational History  . Occupation: Wellsite geologistwaitress   Social Needs  . Financial resource strain: Not on file  . Food insecurity    Worry: Not on file    Inability: Not on file  . Transportation needs    Medical: Not on file    Non-medical: Not on file  Tobacco Use  . Smoking status: Never Smoker  . Smokeless tobacco: Never Used  Substance and Sexual Activity  . Alcohol use: Yes    Comment: occ  . Drug use: Yes    Types: Marijuana  . Sexual activity: Yes    Birth control/protection: Implant  Lifestyle  . Physical activity    Days per week: Not on file    Minutes per session: Not on file  . Stress: Not on file  Relationships  . Social Musicianconnections    Talks on phone: Not on file    Gets together: Not on file    Attends religious service: Not on file    Active member of club or organization: Not on file    Attends meetings of clubs or organizations: Not on file    Relationship status: Not on file  Other Topics Concern  . Not on file  Social  History Narrative   Household-- father and mother    Stopped college [employer], [place] d/t a concussion, couldn't play volley-ball 05-2015       Additional Social History:                         Sleep: Fair  Appetite:  Fair  Current Medications: Current Facility-Administered Medications  Medication Dose Route Frequency Provider Last Rate Last Dose  . acetaminophen (TYLENOL) tablet 650 mg  650 mg Oral Q6H PRN Denzil Magnusonhomas, Lashunda, NP   650 mg at 12/21/18 2158  . albuterol (VENTOLIN HFA) 108 (90 Base) MCG/ACT inhaler 1-2 puff  1-2 puff Inhalation Q6H PRN Antonieta Pertlary, Greg Lawson, MD      . alum & mag hydroxide-simeth (MAALOX/MYLANTA) 200-200-20 MG/5ML suspension 30 mL  30 mL Oral Q4H PRN Denzil Magnusonhomas, Lashunda, NP      .  chlordiazePOXIDE (LIBRIUM) capsule 25 mg  25 mg Oral QID PRN Antonieta Pertlary, Greg Lawson, MD      . FLUoxetine (PROZAC) capsule 40 mg  40 mg Oral Daily Antonieta Pertlary, Greg Lawson, MD   40 mg at 12/23/18 0933  . folic acid (FOLVITE) tablet 1 mg  1 mg Oral Daily Antonieta Pertlary, Greg Lawson, MD   1 mg at 12/23/18 [id number]  . hydrOXYzine (ATARAX/VISTARIL) tablet 25 mg  25 mg Oral TID PRN Jackelyn PolingBerry, Jason A, NP   25 mg at 12/22/18 2135  . ibuprofen (ADVIL) tablet 400 mg  400 mg Oral Q6H PRN Antonieta Pertlary, Greg Lawson, MD      . thiamine (VITAMIN B-1) tablet 100 mg  100 mg Oral Daily Antonieta Pertlary, Greg Lawson, MD   100 mg at 12/23/18 0933  . traZODone (DESYREL) tablet 50 mg  50 mg Oral QHS,MR X 1 Nira ConnBerry, Jason A, NP   50 mg at 12/22/18 2135    Lab Results:  Results for orders placed or performed during the hospital encounter of 12/21/18 (from the past 48 hour(s))  Hemoglobin A1c     Status: Abnormal   Collection Time: 12/22/18  6:30 AM  Result Value Ref Range   Hgb A1c MFr Bld 4.3 (L) 4.8 - 5.6 %    Comment: (NOTE) Pre diabetes:          5.7%-6.4% Diabetes:              >6.4% Glycemic control for   <7.0% adults with diabetes    Mean Plasma Glucose 76.71 mg/dL    Comment: Performed at [facility], [address] [place], [place] [address]  Lipid panel     Status: Abnormal   Collection Time: 12/22/18  6:30 AM  Result Value Ref Range   Cholesterol 207 (H) 0 - 200 mg/dL   Triglycerides 89 <409<150 mg/dL   HDL 811106 >91>40 mg/dL   Total CHOL/HDL Ratio 2.0 RATIO   VLDL 18 0 - 40 mg/dL   LDL Cholesterol 83 0 - 99 mg/dL    Comment:        Total Cholesterol/HDL:CHD Risk Coronary Heart Disease Risk Table                     Men   Women  1/2 Average Risk   3.4   3.3  Average Risk       5.0   4.4  2 X Average Risk   9.6   7.1  3 X Average Risk  23.4   11.0        Use  the calculated Patient Ratio above and the CHD Risk Table to determine the patient's CHD Risk.        ATP III CLASSIFICATION (LDL):  <100     mg/dL   Optimal  [id number]  mg/dL    Near or Above                    Optimal  130-159  mg/dL   Borderline  [id number]  mg/dL   High  >086>190     mg/dL   Very High Performed at [facility], [address] [place], [place] [address]   TSH     Status: None   Collection Time: 12/22/18  6:30 AM  Result Value Ref Range   TSH 3.452 0.350 - 4.500 uIU/mL    Comment: Performed by a 3rd Generation assay with a functional sensitivity of <=0.01 uIU/mL. Performed at [facility], [address] [place], [place] [address]     Blood Alcohol level:  Lab Results  Component Value Date   ETH 173 (H) 12/21/2018    Metabolic Disorder Labs: Lab Results  Component Value Date   HGBA1C 4.3 (L) 12/22/2018   MPG 76.71 12/22/2018   No results found for: PROLACTIN Lab Results  Component Value Date   CHOL 207 (H) 12/22/2018   TRIG 89 12/22/2018   HDL 106 12/22/2018   CHOLHDL 2.0 12/22/2018   VLDL 18 12/22/2018   LDLCALC 83 12/22/2018    Physical Findings: AIMS: Facial and Oral Movements Muscles of Facial Expression: None, normal Lips and Perioral Area: None, normal Jaw: None, normal Tongue: None, normal,Extremity Movements Upper (arms, wrists, hands, fingers): None, normal Lower (legs, knees, ankles, toes): None, normal, Trunk Movements Neck, shoulders, hips: None, normal, Overall Severity Severity of abnormal movements (highest score from questions above): None, normal Incapacitation due to abnormal movements: None, normal Patient's awareness of abnormal movements (rate only patient's report): No Awareness, Dental Status Current problems with teeth and/or dentures?: No Does patient usually wear dentures?: No  CIWA:    COWS:     Musculoskeletal: Strength & Muscle Tone: within normal limits Gait & Station: normal Patient leans: N/A  Psychiatric Specialty Exam: Physical Exam  Nursing note and vitals reviewed. Skin: Skin is warm.    Review of Systems  Psychiatric/Behavioral:  Positive for depression. Negative for suicidal ideas. The patient is nervous/anxious.   All other systems reviewed and are negative.   Blood pressure 117/81, pulse 70, temperature 97.8 F (36.6 C), temperature source Oral, resp. rate 16, height 5\' 7"  (1.702 m), weight 80.7 kg, last menstrual period 12/07/2018, SpO2 100 %.Body mass index is 27.88 kg/m.  General Appearance: Casual  Eye Contact:  Fair  Speech:  Clear and Coherent  Volume:  Normal  Mood:  Anxious and Depressed  Affect:  Congruent  Thought Process:  Coherent  Orientation:  Full (Time, Place, and Person)  Thought Content:  Logical  Suicidal Thoughts:  No  Homicidal Thoughts:  No  Memory:  Immediate;   Fair Remote;   Fair  Judgement:  Fair  Insight:  Fair  Psychomotor Activity:  Normal  Concentration:  Concentration: Fair  Recall:  Good  Fund of Knowledge:  Fair  Language:  Fair  Akathisia:  No  Handed:  Right  AIMS (if indicated):     Assets:  Communication Skills Desire for Improvement  ADL's:  Intact  Cognition:  WNL  Sleep:  Number of Hours: 6     Treatment Plan  Summary: Daily contact with patient to assess and evaluate symptoms and progress in treatment and Medication management   Continue with current treatment plan on 12/23/2018 as listed below except were noted  Major depressive disorder  Continue Prozac 40 mg p.o. daily Continue trazodone 50 mg p.o. nightly Continue Librium 25 mg p.o. PRN per CIWA protocol  CSW to continue working on discharge disposition Patient encouraged to participate within the therapeutic milieu   Derrill Center, NP 12/23/2018, 12:00 PM

## 2018-12-23 NOTE — Plan of Care (Signed)
  Problem: Education: Goal: Knowledge of [place] [facility] Education information/materials will improve Outcome: Progressing   

## 2018-12-23 NOTE — [facility] Group Notes (Signed)
[place] Group Notes:  (Nursing)  Date:  12/23/2018  Time: 100 PM Type of Therapy:  Nurse Education  Participation Level:  Active  Participation Quality:  Attentive  Affect:  Appropriate  Cognitive:  Appropriate  Insight:  Appropriate  Engagement in Group:  Engaged  Modes of Intervention:  Education  Summary of Progress/Problems: Life Skills Group  Waymond Cera 12/23/2018, 4:47 PM

## 2018-12-23 NOTE — Progress Notes (Signed)

## 2018-12-23 NOTE — Progress Notes (Signed)
D Pt is observed OOB UAL on the 400 hall today. She is brighter, less isolative, makes longer eye contact with this writer and her mood is less depressed today.     A She completed her daily assessment and on this she wrote she denied SI today and she rated her dperession, hopelessness and anxiety " 0/0/0/", respectively. She state she " is beginning to understand why I behave like I do and to understand how to get what  I need in a healthier manner", after attending [facility] [employer] today.     R Safety si in place.

## 2018-12-23 NOTE — [facility] Group Notes (Signed)
LCSW Group Therapy Note  12/23/2018    10:00-11:00am   Type of Therapy and Topic:  Group Therapy: Early Messages Received About Anger  Participation Level:  Active   Description of Group:   In this group, patients shared and discussed the early messages received in their lives about anger through parental or other adult modeling, teaching, repression, punishment, violence, and more.  Participants identified how those childhood lessons influence even now how they usually or often react when angered.  The group discussed that anger is a secondary emotion and what may be the underlying emotional themes that come out through anger outbursts or that are ignored through anger suppression.    Therapeutic Goals: 1. Patients will identify one or more childhood message about anger that they received and how it was taught to them. 2. Patients will discuss how these childhood experiences have influenced and continue to influence their own expression or repression of anger even today. 3. Patients will explore possible primary emotions that tend to fuel their secondary emotion of anger. 4. Patients will learn that anger itself is normal and cannot be eliminated, and that healthier coping skills can assist with resolving conflict rather than worsening situations.  Summary of Patient Progress:  The patient shared that her childhood lessons about anger were that it is a wasted emotion.  As a result, in her childhood she never had anger, but when she became a teenager, she would have anger episodes that were so severe she would black out.  She was in therapy and feels she has learned how to walk away now.  Therapeutic Modalities:   Cognitive Behavioral Therapy Motivation Interviewing  Maretta Los  .

## 2018-12-24 NOTE — [facility] Group Notes (Signed)
[facility] Group Notes: Nursing Education Group  Date:  12/24/2018  Time:  1:15 PM  Facilitator: Patty D., RN  Type of Therapy:  Nurse Education  Participation Level:  Active  Participation Quality:  Appropriate  Affect:  Appropriate  Cognitive:  Alert and Oriented  Insight:  Appropriate  Engagement in Group:  Developing/Improving  Modes of Intervention:  Activity, Discussion, Socialization and Support  Summary of Progress/Problems: Patient attended group and was not disruptive in the discussion.   Joylyn Duggin A Jase Himmelberger 12/24/2018, 1:15 PM 

## 2018-12-24 NOTE — Progress Notes (Signed)
D.  Pt pleasant on approach, no complaints voiced.  Pt observed in dayroom interacting appropriately with peers on the unit.  Pt denies SI/HI/AVH at this time.  Pt showered and requested bandage over stitches to right wrist be replaced.  No redness or swelling noted to area.  A.  Support and encouragement offered, medication given as ordered  R.  Pt remains safe on the unit, will continue to monitor.

## 2018-12-24 NOTE — Progress Notes (Signed)
D.  Pt pleasant on approach, no complaints voiced at this time.  Pt observed up and appropriately engaged with peers on the unit.  Pt denies SI/HI/AVH at this time and is hopeful for discharge tomorrow.  A.  Support and encouragement offered, medication given as ordered  R.  Pt remains safe on the unit, will continue to monitor.

## 2018-12-24 NOTE — Progress Notes (Signed)
[place] NOVEL CORONAVIRUS (COVID-19) DAILY CHECK-OFF SYMPTOMS - answer yes or no to each - every day NO YES  Have you had a fever in the past 24 hours?  . Fever (Temp > 37.80C / 100F) X   Have you had any of these symptoms in the past 24 hours? . New Cough .  Sore Throat  .  Shortness of Breath .  Difficulty Breathing .  Unexplained Body Aches   X   Have you had any one of these symptoms in the past 24 hours not related to allergies?   . Runny Nose .  Nasal Congestion .  Sneezing   X   If you have had runny nose, nasal congestion, sneezing in the past 24 hours, has it worsened?  X   EXPOSURES - check yes or no X   Have you traveled outside the state in the past 14 days?  X   Have you been in contact with someone with a confirmed diagnosis of COVID-19 or PUI in the past 14 days without wearing appropriate PPE?  X   Have you been living in the same home as a person with confirmed diagnosis of COVID-19 or a PUI (household contact)?    X   Have you been diagnosed with COVID-19?    X              What to do next: Answered NO to all: Answered YES to anything:   Proceed with unit schedule Follow the [facility] Inpatient Flowsheet.   

## 2018-12-24 NOTE — Progress Notes (Signed)
[facility]  12/24/2018 9:44 AM Emmaline Liferin A Sisler  MRN:  [id number]   Subjective: Denny Peonrin reported " I am doing okay,  I am just a little sad that I am in here for my birthday."  Evaluation: Shaine seen sitting in day room interacting with peers.  She presents pleasant calm and cooperative.  Denying suicidal or homicidal ideations.  Denies auditory or visual hallucinations.  Patient reports attending daily group sessions with active and engaged participation.  Reports learning new coping skills related to depression and anxiety.  Reported " I have a new perspective with my depression after attending group on yesterday."  Reported she is going to start journaling her feelings.  Rates her depression 0 out of 10 with 10 being the worst.  Continues to deny alcohol cravings or withdrawal symptoms.  Reports taking and tolerating medications well.  Reports a good appetite.  States she is resting well throughout the night.  Support, encouragement and reassurance was provided.   Principal Problem: MDD (major depressive disorder) Diagnosis: Principal Problem:   MDD (major depressive disorder)  Total Time spent with patient: 15 minutes  Past Psychiatric History:   Past Medical History:  Past Medical History:  Diagnosis Date  . ADHD (attention deficit hyperactivity disorder)   . H/O multiple concussions    seen by neuro  . Headache    frequent  . Short-term memory loss    cause from multiple concussions?    Past Surgical History:  Procedure Laterality Date  . BUNIONECTOMY    . KNEE ARTHROSCOPY Left 2016   Family History:  Family History  Adopted: Yes  Problem Relation Age of Onset  . Drug abuse Mother    Family Psychiatric  History:  Social History:  Social History   Substance and Sexual Activity  Alcohol Use Yes   Comment: occ     Social History   Substance and Sexual Activity  Drug Use Yes  . Types: Marijuana    Social History   Socioeconomic History  . Marital status:  Single    Spouse name: Not on file  . Number of children: 0  . Years of education: Not on file  . Highest education level: Not on file  Occupational History  . Occupation: Wellsite geologistwaitress   Social Needs  . Financial resource strain: Not on file  . Food insecurity    Worry: Not on file    Inability: Not on file  . Transportation needs    Medical: Not on file    Non-medical: Not on file  Tobacco Use  . Smoking status: Never Smoker  . Smokeless tobacco: Never Used  Substance and Sexual Activity  . Alcohol use: Yes    Comment: occ  . Drug use: Yes    Types: Marijuana  . Sexual activity: Yes    Birth control/protection: Implant  Lifestyle  . Physical activity    Days per week: Not on file    Minutes per session: Not on file  . Stress: Not on file  Relationships  . Social Musicianconnections    Talks on phone: Not on file    Gets together: Not on file    Attends religious service: Not on file    Active member of club or organization: Not on file    Attends meetings of clubs or organizations: Not on file    Relationship status: Not on file  Other Topics Concern  . Not on file  Social History Narrative   Household-- father and  mother    [employer] [employer], couldn't play volley-ball 05-2015       Additional Social History:                         Sleep: Fair  Appetite:  Fair  Current Medications: Current Facility-Administered Medications  Medication Dose Route Frequency Provider Last Rate Last Dose  . acetaminophen (TYLENOL) tablet 650 mg  650 mg Oral Q6H PRN Denzil Magnusonhomas, Lashunda, NP   650 mg at 12/21/18 2158  . albuterol (VENTOLIN HFA) 108 (90 Base) MCG/ACT inhaler 1-2 puff  1-2 puff Inhalation Q6H PRN Antonieta Pertlary, Greg Lawson, MD      . alum & mag hydroxide-simeth (MAALOX/MYLANTA) 200-200-20 MG/5ML suspension 30 mL  30 mL Oral Q4H PRN Denzil Magnusonhomas, Lashunda, NP      . chlordiazePOXIDE (LIBRIUM) capsule 25 mg  25 mg Oral QID PRN Antonieta Pertlary, Greg Lawson, MD       . FLUoxetine (PROZAC) capsule 40 mg  40 mg Oral Daily Antonieta Pertlary, Greg Lawson, MD   40 mg at 12/24/18 0844  . folic acid (FOLVITE) tablet 1 mg  1 mg Oral Daily Antonieta Pertlary, Greg Lawson, MD   1 mg at 12/24/18 0844  . hydrOXYzine (ATARAX/VISTARIL) tablet 25 mg  25 mg Oral TID PRN Jackelyn PolingBerry, Jason A, NP   25 mg at 12/23/18 2203  . ibuprofen (ADVIL) tablet 400 mg  400 mg Oral Q6H PRN Antonieta Pertlary, Greg Lawson, MD      . thiamine (VITAMIN B-1) tablet 100 mg  100 mg Oral Daily Antonieta Pertlary, Greg Lawson, MD   100 mg at 12/24/18 0844  . traZODone (DESYREL) tablet 50 mg  50 mg Oral QHS,MR X 1 Nira ConnBerry, Jason A, NP   50 mg at 12/23/18 2203    Lab Results:  No results found for this or any previous visit (from the past 48 hour(s)).  Blood Alcohol level:  Lab Results  Component Value Date   ETH 173 (H) 12/21/2018    Metabolic Disorder Labs: Lab Results  Component Value Date   HGBA1C 4.3 (L) 12/22/2018   MPG 76.71 12/22/2018   No results found for: PROLACTIN Lab Results  Component Value Date   CHOL 207 (H) 12/22/2018   TRIG 89 12/22/2018   HDL 106 12/22/2018   CHOLHDL 2.0 12/22/2018   VLDL 18 12/22/2018   LDLCALC 83 12/22/2018    Physical Findings: AIMS: Facial and Oral Movements Muscles of Facial Expression: None, normal Lips and Perioral Area: None, normal Jaw: None, normal Tongue: None, normal,Extremity Movements Upper (arms, wrists, hands, fingers): None, normal Lower (legs, knees, ankles, toes): None, normal, Trunk Movements Neck, shoulders, hips: None, normal, Overall Severity Severity of abnormal movements (highest score from questions above): None, normal Incapacitation due to abnormal movements: None, normal Patient's awareness of abnormal movements (rate only patient's report): No Awareness, Dental Status Current problems with teeth and/or dentures?: No Does patient usually wear dentures?: No  CIWA:    COWS:     Musculoskeletal: Strength & Muscle Tone: within normal limits Gait & Station:  normal Patient leans: N/A  Psychiatric Specialty Exam: Physical Exam  Nursing note and vitals reviewed. Skin: Skin is warm.  Psychiatric: She has a normal mood and affect. Her behavior is normal.    Review of Systems  Psychiatric/Behavioral: Positive for depression (improving ). Negative for suicidal ideas. The patient is nervous/anxious.   All other systems reviewed and are negative.   Blood pressure 98/69, pulse 84,  temperature 97.9 F (36.6 C), temperature source Oral, resp. rate 16, height 5\' 7"  (1.702 m), weight 80.7 kg, last menstrual period 12/07/2018, SpO2 100 %.Body mass index is 27.88 kg/m.  General Appearance: Casual  Eye Contact:  Fair  Speech:  Clear and Coherent  Volume:  Normal  Mood:  Anxious and Depressed  Affect:  Congruent  Thought Process:  Coherent  Orientation:  Full (Time, Place, and Person)  Thought Content:  Logical  Suicidal Thoughts:  No  Homicidal Thoughts:  No  Memory:  Immediate;   Fair Remote;   Fair  Judgement:  Fair  Insight:  Fair  Psychomotor Activity:  Normal  Concentration:  Concentration: Fair  Recall:  Good  Fund of Knowledge:  Fair  Language:  Fair  Akathisia:  No  Handed:  Right  AIMS (if indicated):     Assets:  Communication Skills Desire for Improvement  ADL's:  Intact  Cognition:  WNL  Sleep:  Number of Hours: 6.25     Treatment Plan Summary: Daily contact with patient to assess and evaluate symptoms and progress in treatment and Medication management   Continue with current treatment plan on 12/24/2018 as listed below except were noted  Major depressive disorder:  Continue Prozac 40 mg p.o. daily Continue trazodone 50 mg p.o. nightly Continue Librium 25 mg p.o. PRN per CIWA protocol  CSW to continue working on discharge disposition Patient encouraged to participate within the therapeutic milieu   Derrill [place], NP 12/24/2018, 9:44 AM

## 2018-12-24 NOTE — Progress Notes (Signed)
D Pt is observed OOB UAL on the 400 hall today. Approx 0945, pt sought this Probation officer out, saying " Im really upset...ca you please talk to me?" pt oberved with red teary eyes, her nose is stopped up and she is sniffling- as she is holding back tears. She says " I dont know what to do.Tabitha KitchenMarland KitchenMarland KitchenIve never beeen away from my family on my birthday before...".      A This Probation officer and pt processed this feeling for several minutes, while she was able to cry and be in her feelings. Pt answered yes to wanting to do  / learn whatever she needed " so I can get better". Pt able to understand that by choosing to think about what she doesn't have- her birthday today- home with her family- she is choosing to stay unhealthy. Pt stated she wants to learn healtheir  Coping skills .  This Probation officer and encouraged patient to cont to process how her thought processes are affecting her thinking and how thinking affects ( sometimes negatively ) our behavior. Pt thanked this Probation officer profusely and pt completed her daily assessment. On this she wrote she deneid having SI today and she rated her dperession, hopelessness and axneity      R Safety in place.

## 2018-12-24 NOTE — [facility] Group Notes (Signed)
[facility] LCSW Group Therapy Note  12/24/2018   10:00-11:00AM  Type of Therapy and Topic:  Group Therapy:  Unhealthy versus Healthy Supports, Which Am I?  Participation Level:  Active   Description of Group:  Patients in this group were introduced to the concept that additional supports including self-support are an essential part of recovery.  Initially a discussion was held about the differences between healthy versus unhealthy supports.  Patients were asked to share what healthy supports are helping them to achieve their goals and what unhealthy supports in their lives need to be addressed.   A song entitled "My Own Hero" was played and a group discussion ensued in which patients stated they could relate to the song and it inspired them to realize they have be willing to help themselves in order to succeed, because other people cannot achieve sobriety or stability for them.  We discussed adding a variety of healthy supports to address the various needs in patient lives, including becoming more self-supportive.  A song was then played called "I Am Enough" and people stated they related to this when it talked about being your own worst enemy.  A song was played called "I Know Where I've Been" toward the end of group and used to conduct an inspirational wrap-up to group of remembering how far they have already come in their journey.  Therapeutic Goals: 1)  Highlight the differences between healthy and unhealthy supports 2)  Suggest the importance of being a part of one's own support system  3)  Identify the patient's current support system   4) Elicit commitments to add healthy supports and to become more conscious of being self-supportive   Summary of Patient Progress:  The patient listed as healthy supports the following:  family.  The patient expressed that unhealthy supports to be addressed include one co-worker.  Patient feels she is a healthy support for herself.  Healthy supports which could be added  for increased stability and happiness include professionals.  Therapeutic Modalities:   Motivational Interviewing Activity  Maretta Los , MSW, LCSW

## 2018-12-25 MED ORDER — FLUOXETINE HCL 40 MG PO CAPS: 40.0000 mg | ORAL_CAPSULE | Freq: Every day | ORAL | 0 refills | Status: DC

## 2018-12-25 NOTE — Progress Notes (Signed)
Patient ID: Tabitha Hawkins, female   DOB: May 15, 1996, 23 y.o.   MRN: [id number]  D: Pt alert and oriented on the unit.   A: Education, support, and encouragement provided. Discharge summary, medications and follow up appointments reviewed with pt. Suicide prevention resources provided, including "My 3 App." Pt's belongings in locker # 16 and items on shelf in luggage area returned, and belongings sheet signed.  R: Pt denies SI/HI, A/VH, pain, or any concerns at this time. Pt ambulatory on and off unit. Pt discharged to lobby.

## 2018-12-25 NOTE — Progress Notes (Signed)
  [facility] Adult Case Management Discharge Plan :  Will you be returning to the same living situation after discharge:  No. Patient is discharging home with her parents. She reports she will stay with them for a couple of days before returning home to her apartment.  At discharge, do you have transportation home?: Yes,  patient reports her parents are picking her up Do you have the ability to pay for your medications: Yes,  BCBS, income from employment, support from parents  Release of information consent forms completed and in the chart;  Patient's signature needed at discharge.  Patient to Follow up at: Follow-up Information    [facility] Follow up on 01/01/2019.   Specialty: Behavioral Health Why: Therapy appt with Rollene Fare is Monday, 7/13 at 4:00p.  Medication management appt with Dr. Mamie Nick is Monday, 7/20 at 1:00p.  Appts will be done virtually, an email link will be sent to you through WebEx.  Contact information: [address] [place] [phone]          Next level of care provider has access to [place] and Suicide Prevention discussed: Yes,  with the patient's mother     Has patient been referred to the Quitline?: N/A patient is not a smoker  Patient has been referred for addiction treatment: N/A  Tabitha Hawkins, [place] 12/25/2018, 11:53 AM

## 2018-12-25 NOTE — [facility] Suicide Risk Assessment (Signed)
[place] INPATIENT:  Family/Significant Other Suicide Prevention Education  Suicide Prevention Education:  Education Completed; with mother, Tabitha Hawkins [phone]) has been identified by the patient as the family member/significant other with whom the patient will be residing, and identified as the person(s) who will aid the patient in the event of a mental health crisis (suicidal ideations/suicide attempt).  With written consent from the patient, the family member/significant other has been provided the following suicide prevention education, prior to the and/or following the discharge of the patient.  The suicide prevention education provided includes the following:  Suicide risk factors  Suicide prevention and interventions  National Suicide Hotline telephone number  [facility] assessment telephone number  [facility] Emergency Assistance [place] and/or Residential Mobile Crisis Unit telephone number  Request made of family/significant other to:  Remove weapons (e.g., guns, rifles, knives), all items previously/currently identified as safety concern.    Remove drugs/medications (over-the-counter, prescriptions, illicit drugs), all items previously/currently identified as a safety concern.  The family member/significant other verbalizes understanding of the suicide prevention education information provided.  The family member/significant other agrees to remove the items of safety concern listed above.  [name] reports not having any concerns regarding the patient's discharge. She reports she will pick the patient up at discharge.   CSW will continue to follow for a safe discharge.     Tabitha Hawkins 12/25/2018, 9:59 AM

## 2018-12-25 NOTE — [facility] Suicide Risk Assessment (Signed)
[facility] Discharge Suicide Risk Assessment   Principal Problem: MDD (major depressive disorder) Discharge Diagnoses: Principal Problem:   MDD (major depressive disorder)   Total Time spent with patient: 15 minutes  Musculoskeletal: Strength & Muscle Tone: within normal limits Gait & Station: normal Patient leans: N/A  Psychiatric Specialty Exam: Review of Systems  All other systems reviewed and are negative.   Blood pressure 116/65, pulse 87, temperature 98 F (36.7 C), temperature source Oral, resp. rate 16, height 5\' 7"  (1.702 m), weight 80.7 kg, last menstrual period 12/07/2018, SpO2 100 %.Body mass index is 27.88 kg/m.  General Appearance: Casual  Eye Contact::  Good  Speech:  Normal Rate409  Volume:  Normal  Mood:  Euthymic  Affect:  Congruent  Thought Process:  Coherent and Descriptions of Associations: Intact  Orientation:  Full (Time, Place, and Person)  Thought Content:  Logical  Suicidal Thoughts:  No  Homicidal Thoughts:  No  Memory:  Immediate;   Fair Recent;   Fair Remote;   Fair  Judgement:  Intact  Insight:  Good  Psychomotor Activity:  Normal  Concentration:  Good  Recall:  Good  Fund of Knowledge:Good  Language: Good  Akathisia:  Negative  Handed:  Right  AIMS (if indicated):     Assets:  Communication Skills Desire for Improvement Housing Resilience  Sleep:  Number of Hours: 6  Cognition: WNL  ADL's:  Intact   Mental Status Per Nursing Assessment::   On Admission:  Suicidal ideation indicated by patient, Self-harm behaviors, Suicide plan, Intention to act on suicide plan, Plan includes specific time, place, or method, Belief that plan would result in death  Demographic Factors:  Adolescent or young adult and Caucasian  Loss Factors: NA  Historical Factors: Impulsivity  Risk Reduction Factors:   Living with another person, especially a relative and Positive social support  Continued Clinical Symptoms:  Depression:    Impulsivity  Cognitive Features That Contribute To Risk:  None    Suicide Risk:  Minimal: No identifiable suicidal ideation.  Patients presenting with no risk factors but with morbid ruminations; may be classified as minimal risk based on the severity of the depressive symptoms    Plan Of Care/Follow-up recommendations:  Activity:  ad lib  Sharma Covert, MD 12/25/2018, 7:41 AM

## 2018-12-25 NOTE — Discharge Summary (Addendum)
Physician Discharge Summary Note  Patient:  Tabitha Hawkins is an 23 y.o., female MRN:  [id number] DOB:  April 17, 1996 Patient phone:  [phone] (home)  Patient address:   [place] [address],  Total Time spent with patient: 15 minutes  Date of Admission:  12/21/2018 Date of Discharge: 12/25/2018  Reason for Admission:  suicidal ideation  Principal Problem: MDD (major depressive disorder) Discharge Diagnoses: Principal Problem:   MDD (major depressive disorder)   Past Psychiatric History: She denied any previous psychiatric evaluations, she has been prescribed fluoxetine from her family physician for depression.  She has been in therapy.  Past Medical History:  Past Medical History:  Diagnosis Date  . ADHD (attention deficit hyperactivity disorder)   . H/O multiple concussions    seen by neuro  . Headache    frequent  . Short-term memory loss    cause from multiple concussions?    Past Surgical History:  Procedure Laterality Date  . BUNIONECTOMY    . KNEE ARTHROSCOPY Left 2016   Family History:  Family History  Adopted: Yes  Problem Relation Age of Onset  . Drug abuse Mother    Family Psychiatric  History: Mother with reported history of substance abuse.  She is adopted and does not know her full family history. Social History:  Social History   Substance and Sexual Activity  Alcohol Use Yes   Comment: occ     Social History   Substance and Sexual Activity  Drug Use Yes  . Types: Marijuana    Social History   Socioeconomic History  . Marital status: Single    Spouse name: Not on file  . Number of children: 0  . Years of education: Not on file  . Highest education level: Not on file  Occupational History  . Occupation: Optometrist  . Financial resource strain: Not on file  . Food insecurity    Worry: Not on file    Inability: Not on file  . Transportation needs    Medical: Not on file    Non-medical: Not on file   Tobacco Use  . Smoking status: Never Smoker  . Smokeless tobacco: Never Used  Substance and Sexual Activity  . Alcohol use: Yes    Comment: occ  . Drug use: Yes    Types: Marijuana  . Sexual activity: Yes    Birth control/protection: Implant  Lifestyle  . Physical activity    Days per week: Not on file    Minutes per session: Not on file  . Stress: Not on file  Relationships  . Social Herbalist on phone: Not on file    Gets together: Not on file    Attends religious service: Not on file    Active member of club or organization: Not on file    Attends meetings of clubs or organizations: Not on file    Relationship status: Not on file  Other Topics Concern  . Not on file  Social History Narrative   Household-- father and mother    Stopped college [employer], [place] d/t a concussion, couldn't play volley-ball [phone]        Hospital Course:  From admission H&P: Patient is a 23 year old female who was transferred from the [facility] emergency department on 01/08/2019 after the patient presented there with suicidal ideation. The patient came in after cutting her right wrist at home. The patient stated she was unable to  really cite any new additional stressors. She stated that she had been suicidal for a while. She denied any particular interactions that led to her cutting herself the night before. She admitted to cannabis use and social alcohol. She did admit that she had been prescribed fluoxetine 20 mg p.o. daily and felt as though it had been helpful up until recently. She denied any previous psychiatric admissions, she denied any previous evaluations by psychiatry. She did admit to previously having had therapy. She was admitted to the hospital for evaluation and stabilization.  Ms. Tabitha Hawkins was admitted for depression with suicidal ideation after cutting herself. She denied history of cutting and identified financial stress as a trigger. She  [facility]. Prozac was increased. She participated in group therapy on the unit. She responded well to treatment with no adverse effects reported. She has been pleasant and visible in the unit milieu, interacting appropriately with others.  She is discharging on the medications listed below. She has shown improvement with improved mood, affect, sleep, appetite, and interaction. She denies any SI/HI/AVH and contracts for safety. [facility] (see below). Patient is provided with prescriptions for medications upon discharge. Her parents are picking her up for discharge home.  Physical Findings: AIMS: Facial and Oral Movements Muscles of Facial Expression: None, normal Lips and Perioral Area: None, normal Jaw: None, normal Tongue: None, normal,Extremity Movements Upper (arms, wrists, hands, fingers): None, normal Lower (legs, knees, ankles, toes): None, normal, Trunk Movements Neck, shoulders, hips: None, normal, Overall Severity Severity of abnormal movements (highest score from questions above): None, normal Incapacitation due to abnormal movements: None, normal Patient's awareness of abnormal movements (rate only patient's report): No Awareness, Dental Status Current problems with teeth and/or dentures?: No Does patient usually wear dentures?: No  CIWA:    COWS:     Musculoskeletal: Strength & Muscle Tone: within normal limits Gait & Station: normal Patient leans: N/A  Psychiatric Specialty Exam: Physical Exam  Nursing note and vitals reviewed. Constitutional: She is oriented to person, place, and time. She appears well-developed and well-nourished.  Cardiovascular: Normal rate.  Respiratory: Effort normal.  Neurological: She is alert and oriented to person, place, and time.    Review of Systems  Constitutional: Negative.   Psychiatric/Behavioral: Positive for depression (stable on medication) and substance abuse  (UDS +THC). Negative for hallucinations and suicidal ideas. The patient is not nervous/anxious and does not have insomnia.     Blood pressure 116/65, pulse 87, temperature 98 F (36.7 C), temperature source Oral, resp. rate 16, height 5\' 7"  (1.702 m), weight 80.7 kg, last menstrual period 12/07/2018, SpO2 100 %.Body mass index is 27.88 kg/m.  See MD's discharge SRA        Has this patient used any form of tobacco in the last 30 days? (Cigarettes, Smokeless Tobacco, Cigars, and/or Pipes)  No  Blood Alcohol level:  Lab Results  Component Value Date   ETH 173 (H) 12/21/2018    Metabolic Disorder Labs:  Lab Results  Component Value Date   HGBA1C 4.3 (L) 12/22/2018   MPG 76.71 12/22/2018   No results found for: PROLACTIN Lab Results  Component Value Date   CHOL 207 (H) 12/22/2018   TRIG 89 12/22/2018   HDL 106 12/22/2018   CHOLHDL 2.0 12/22/2018   VLDL 18 12/22/2018   LDLCALC 83 12/22/2018    See Psychiatric Specialty Exam and Suicide Risk Assessment completed by Attending Physician prior  to discharge.  Discharge destination:  Home  Is patient on multiple antipsychotic therapies at discharge:  No   Has Patient had three or more failed trials of antipsychotic monotherapy by history:  No  Recommended Plan for Multiple Antipsychotic Therapies: NA  Discharge Instructions    Discharge instructions   Complete by: As directed    Patient is instructed to take all prescribed medications as recommended. Report any side effects or adverse reactions to your outpatient psychiatrist. Patient is instructed to abstain from alcohol and illegal drugs while on prescription medications. In the event of worsening symptoms, patient is instructed to call the crisis hotline, 911, or go to the nearest emergency department for evaluation and treatment.     Allergies as of 12/25/2018   No Known Allergies     Medication List    STOP taking these medications   budesonide-formoterol 160-4.5  MCG/ACT inhaler Commonly known as: SYMBICORT   Estarylla 0.25-35 MG-MCG tablet Generic drug: norgestimate-ethinyl estradiol   FLUoxetine 20 MG tablet Commonly known as: PROZAC Replaced by: FLUoxetine 40 MG capsule   norgestimate-ethinyl estradiol 0.25-35 MG-MCG tablet Commonly known as: ORTHO-CYCLEN     TAKE these medications     Indication  albuterol 108 (90 Base) MCG/ACT inhaler Commonly known as: Ventolin HFA Inhale 2 puffs into the lungs every 6 (six) hours as needed for wheezing or shortness of breath.  Indication: shortness of breath   etonogestrel 68 MG Impl implant Commonly known as: NEXPLANON 1 each by Subdermal route once.  Indication: Birth Control Treatment   FLUoxetine 40 MG capsule Commonly known as: PROZAC Take 1 capsule (40 mg total) by mouth daily. Replaces: FLUoxetine 20 MG tablet  Indication: Depression      Follow-up Information    [facility] Follow up on 01/01/2019.   Specialty: Behavioral Health Why: Therapy appt with Rene KocherRegina is Monday, 7/13 at 4:00p.  Medication management appt with Dr. Demetrius CharityP is Monday, 7/20 at 1:00p.  Appts will be done virtually, an email link will be sent to you through WebEx.  Contact information: [address] [place] [id number] [phone]          Follow-up recommendations: Activity as tolerated. Diet as recommended by primary care physician. Keep all scheduled follow-up appointments as recommended.   Comments:   Patient is instructed to take all prescribed medications as recommended. Report any side effects or adverse reactions to your outpatient psychiatrist. Patient is instructed to abstain from alcohol and illegal drugs while on prescription medications. In the event of worsening symptoms, patient is instructed to call the crisis hotline, 911, or go to the nearest emergency department for evaluation and treatment.  Signed: Aldean BakerJanet E Exie Chrismer, NP 12/26/2018,  8:12 AM

## 2018-12-28 ENCOUNTER — Ambulatory Visit: Payer: BC Managed Care – PPO | Admitting: Orthotics

## 2018-12-28 ENCOUNTER — Other Ambulatory Visit: Payer: Self-pay

## 2018-12-28 DIAGNOSIS — M2141 Flat foot [pes planus] (acquired), right foot: Secondary | ICD-10-CM

## 2018-12-28 DIAGNOSIS — M2142 Flat foot [pes planus] (acquired), left foot: Secondary | ICD-10-CM

## 2018-12-28 DIAGNOSIS — M6788 Other specified disorders of synovium and tendon, other site: Secondary | ICD-10-CM

## 2018-12-28 NOTE — Progress Notes (Signed)
Patient came in today to pick up custom made foot orthotics.  The goals were accomplished and the patient reported no dissatisfaction with said orthotics.  Patient was advised of breakin period and how to report any issues. 

## 2018-12-29 ENCOUNTER — Ambulatory Visit (INDEPENDENT_AMBULATORY_CARE_PROVIDER_SITE_OTHER): Payer: BC Managed Care – PPO | Admitting: Internal Medicine

## 2018-12-29 ENCOUNTER — Encounter: Payer: Self-pay | Admitting: Internal Medicine

## 2018-12-29 VITALS — BP 114/75 | HR 55 | Temp 98.1°F | Resp 16 | Ht 69.0 in | Wt 179.1 lb

## 2018-12-29 DIAGNOSIS — S61512D Laceration without foreign body of left wrist, subsequent encounter: Secondary | ICD-10-CM | POA: Diagnosis not present

## 2018-12-29 DIAGNOSIS — F32A Depression, unspecified: Secondary | ICD-10-CM

## 2018-12-29 DIAGNOSIS — F419 Anxiety disorder, unspecified: Secondary | ICD-10-CM

## 2018-12-29 DIAGNOSIS — F329 Major depressive disorder, single episode, unspecified: Secondary | ICD-10-CM | POA: Diagnosis not present

## 2018-12-29 DIAGNOSIS — G4700 Insomnia, unspecified: Secondary | ICD-10-CM | POA: Diagnosis not present

## 2018-12-29 MED ORDER — HYDROXYZINE HCL 25 MG PO TABS: 25.0000 mg | ORAL_TABLET | Freq: Every evening | ORAL | 0 refills | Status: DC | PRN

## 2018-12-29 MED ORDER — MUPIROCIN 2 % EX OINT: 1.0000 "application " | TOPICAL_OINTMENT | Freq: Two times a day (BID) | CUTANEOUS | 0 refills | Status: DC

## 2018-12-29 NOTE — Progress Notes (Signed)
Subjective:    Patient ID: Tabitha Hawkins, female    DOB: 04/20/1996, 23 y.o.   MRN: [id number]  DOS:  12/29/2018 Type of visit - description: hospital f/u   Patient was admitted on 12/21/2018 and discharged 4 days later. Was admitted with suicidal ideation, evaluated by psychiatry, Prozac dose was increased. She responded well. She was given a follow-up with behavioral health for 01/01/2019 4 PM. She cut her wrists prior to admission, it was sutured on the ER, she is here mostly for stitch removal.   Review of Systems Since she left the hospital reports good compliance to medication. No suicidal ideas. EtOH: Drinks in moderation during the weekends Still smokes occasional marijuana Denies any other drugs.  Past Medical History:  Diagnosis Date  . ADHD (attention deficit hyperactivity disorder)   . H/O multiple concussions    seen by neuro  . Headache    frequent  . Short-term memory loss    cause from multiple concussions?    Past Surgical History:  Procedure Laterality Date  . BUNIONECTOMY    . KNEE ARTHROSCOPY Left 2016    Social History   Socioeconomic History  . Marital status: Single    Spouse name: Not on file  . Number of children: 0  . Years of education: Not on file  . Highest education level: Not on file  Occupational History  . Occupation: Wellsite geologistwaitress   Social Needs  . Financial resource strain: Not on file  . Food insecurity    Worry: Not on file    Inability: Not on file  . Transportation needs    Medical: Not on file    Non-medical: Not on file  Tobacco Use  . Smoking status: Never Smoker  . Smokeless tobacco: Never Used  Substance and Sexual Activity  . Alcohol use: Yes    Comment: occ  . Drug use: Yes    Types: Marijuana  . Sexual activity: Yes    Birth control/protection: Implant  Lifestyle  . Physical activity    Days per week: Not on file    Minutes per session: Not on file  . Stress: Not on file  Relationships  . Social  Musicianconnections    Talks on phone: Not on file    Gets together: Not on file    Attends religious service: Not on file    Active member of club or organization: Not on file    Attends meetings of clubs or organizations: Not on file    Relationship status: Not on file  . Intimate partner violence    Fear of current or ex partner: Not on file    Emotionally abused: Not on file    Physically abused: Not on file    Forced sexual activity: Not on file  Other Topics Concern  . Not on file  Social History Narrative   Household-- father and mother    Stopped [employer], couldn't play volley-ball 05-2015          Allergies as of 12/29/2018   No Known Allergies     Medication List       Accurate as of December 29, 2018  9:33 AM. If you have any questions, ask your nurse or doctor.        albuterol 108 (90 Base) MCG/ACT inhaler Commonly known as: Ventolin HFA Inhale 2 puffs into the lungs every 6 (six) hours as needed for wheezing or shortness of breath.   etonogestrel  68 MG Impl implant Commonly known as: NEXPLANON 1 each by Subdermal route once.   FLUoxetine 40 MG capsule Commonly known as: PROZAC Take 1 capsule (40 mg total) by mouth daily.           Objective:   Physical Exam LMP 12/07/2018 (Approximate)  General:   Well developed, NAD, BMI noted. HEENT:  Normocephalic . Face symmetric, atraumatic Skin: Not pale. Not jaundice.  4 stitches noted at the right wrist, no discharge or redness, area seems to be well-healed. Neurologic:  alert & oriented X3.  Speech normal, gait appropriate for age and unassisted Psych--  Cognition and judgment appear intact.  Cooperative with normal attention span and concentration.  Behavior appropriate. No anxious or depressed appearing.      Assessment     Assessment Asthma PSYCH/NEURO --ADD- Saw a psychologist 2015, DX ADHD, inattentive type. Rx adderall  08-2013 --anxiety Depression: Admitted  with s/i 12-2018 --Postconcussion syndrome: DX 05-2015, saw neuro- d/c college sports  --Neuropsychological evaluation 08/13/2015 referred by neurology, report reviewed, dx  ADD with hyperactivity; + sx of  postconcussion including headaches.    PLAN: Anxiety, depression: Recently admitted with suicidal ideas, triggered by financial difficulties related to the coronavirus pandemia. Dose of SSRIs were increased, she has a follow-up with psychiatry in 3 days and plans to keep it.  No suicidal ideas at this point. Insomnia: Recommend something for sleep, trazodone?  It interacts with Zoloft, will go for hydroxyzine as needed.  See prescription. Laceration, right wrist: 4 stitches were removed in a sterile fashion, it was very difficult because essentially the skin was going over the suture. Rx  local care, Bactroban, call if infection.   Today, I spent more than  25  min with the patient: >50% of the time counseling regards the importance of follow up  with psychiatry, adding a medication for insomnia and taking time removing the stitches.  Chart was also reviewed.

## 2018-12-29 NOTE — Progress Notes (Signed)
Pre visit review using our clinic review tool, if applicable. No additional management support is needed unless otherwise documented below in the visit note. 

## 2018-12-29 NOTE — Patient Instructions (Signed)
Today antibiotic ointment on your right wrist  Keep the area clean and dry  Call if you see any signs of infection such as redness, discharge, fever or chills  Okay to take Atarax at bedtime as needed for sleep

## 2018-12-31 NOTE — Assessment & Plan Note (Signed)
Anxiety, depression: Recently admitted with suicidal ideas, triggered by financial difficulties related to the coronavirus pandemia. Dose of SSRIs were increased, she has a follow-up with psychiatry in 3 days and plans to keep it.  No suicidal ideas at this point. Insomnia: Recommend something for sleep, trazodone?  It interacts with Zoloft, will go for hydroxyzine as needed.  See prescription. Laceration, right wrist: 4 stitches were removed in a sterile fashion, it was very difficult because essentially the skin was going over the suture. Rx  local care, Bactroban, call if infection.

## 2019-01-01 ENCOUNTER — Ambulatory Visit (HOSPITAL_COMMUNITY): Payer: BC Managed Care – PPO | Admitting: Licensed Clinical Social Worker

## 2019-01-08 ENCOUNTER — Ambulatory Visit (INDEPENDENT_AMBULATORY_CARE_PROVIDER_SITE_OTHER): Payer: BC Managed Care – PPO | Admitting: Psychiatry

## 2019-01-08 ENCOUNTER — Encounter (HOSPITAL_COMMUNITY): Payer: Self-pay | Admitting: Psychiatry

## 2019-01-08 DIAGNOSIS — F331 Major depressive disorder, recurrent, moderate: Secondary | ICD-10-CM | POA: Diagnosis not present

## 2019-01-08 MED ORDER — FLUOXETINE HCL 40 MG PO CAPS: 40.0000 mg | ORAL_CAPSULE | Freq: Every day | ORAL | 0 refills | Status: DC

## 2019-01-08 NOTE — Progress Notes (Signed)
BH MD/PA/NP OP Progress Note  01/08/2019 1:20 PM Tabitha Hawkins  MRN:  [id number]  Chief Complaint: "I am much better".  HPI: 23 yo single white famale with hx of depression and past dx of ADD whose mood recently declined to the point that she started to feel suicidal and cut her right wrist. She was admitted to [facility] on 12/21/18 and stayed for 5 days. She has been depressed on and off for close to 3 years. She associates onset of depression with her having to leave college (was at [employer]) and end of her volleyball career due to recurrent concussions. She admits to having previous SI but never attempted suicide before. She was not der care of a psychiatrist but was prescribed fluoxetine 20 mg by her PCP. Dose of fluoxetine was doubled during this hospitalization and her mood has quickly improved. Sh denies having SI, denies problems with sleep or appetite, her affect is bright, denies having excessive worrying and has not used hydroxyzine since discharge [facility]. Some mild fatigue is long standing. She also has recurrent headaches. She continues to smoke cannabis but only from time to time. Alcohol every other weekend.  She had been dx with having ADHD and was on Adderall then  Strattera - reports that she did not like either and does not wish to be on any medications for ADD. There is no hx of mania or psychosis. She lives in an appartment with her "best friend".  Visit Diagnosis:    ICD-10-CM   1. Major depressive disorder, recurrent episode, moderate (HCC)  F33.1   Interview was conducted using WebEx teleconferencing application and I verified that I was speaking with the correct person using two identifiers. I discussed the limitations of evaluation and management by telemedicine and  the availability of in person appointments. Patient expressed understanding and agreed to proceed.  Past Psychiatric History: See above and intake H&P from 12/22/18 [facility]).  Past Medical History:  Past  Medical History:  Diagnosis Date  . ADHD (attention deficit hyperactivity disorder)   . H/O multiple concussions    seen by neuro  . Headache    frequent  . Short-term memory loss    cause from multiple concussions?    Past Surgical History:  Procedure Laterality Date  . BUNIONECTOMY    . KNEE ARTHROSCOPY Left 2016    Family Psychiatric History:  Family History:  Family History  Adopted: Yes  Problem Relation Age of Onset  . Drug abuse Mother     Social History:  Social History   Socioeconomic History  . Marital status: Single    Spouse name: Not on file  . Number of children: 0  . Years of education: Not on file  . Highest education level: Not on file  Occupational History  . Occupation: Wellsite geologistwaitress   Social Needs  . Financial resource strain: Not on file  . Food insecurity    Worry: Not on file    Inability: Not on file  . Transportation needs    Medical: Not on file    Non-medical: Not on file  Tobacco Use  . Smoking status: Never Smoker  . Smokeless tobacco: Never Used  Substance and Sexual Activity  . Alcohol use: Yes    Comment: occ  . Drug use: Yes    Types: Marijuana  . Sexual activity: Yes    Birth control/protection: Implant  Lifestyle  . Physical activity    Days per week: Not on file  Minutes per session: Not on file  . Stress: Not on file  Relationships  . Social Herbalist on phone: Not on file    Gets together: Not on file    Attends religious service: Not on file    Active member of club or organization: Not on file    Attends meetings of clubs or organizations: Not on file    Relationship status: Not on file  Other Topics Concern  . Not on file  Social History Narrative   Household-- father and mother    Stopped college [employer], [place] d/t a concussion, couldn't play volley-ball 05-2015        Allergies: No Known Allergies  Metabolic Disorder Labs: Lab Results  Component Value Date   HGBA1C 4.3 (L)  12/22/2018   MPG 76.71 12/22/2018   No results found for: PROLACTIN Lab Results  Component Value Date   CHOL 207 (H) 12/22/2018   TRIG 89 12/22/2018   HDL 106 12/22/2018   CHOLHDL 2.0 12/22/2018   VLDL 18 12/22/2018   LDLCALC 83 12/22/2018   Lab Results  Component Value Date   TSH 3.452 12/22/2018   TSH 0.79 07/29/2016    Therapeutic Level Labs: No results found for: LITHIUM No results found for: VALPROATE No components found for:  CBMZ  Current Medications: Current Outpatient Medications  Medication Sig Dispense Refill  . albuterol (VENTOLIN HFA) 108 (90 Base) MCG/ACT inhaler Inhale 2 puffs into the lungs every 6 (six) hours as needed for wheezing or shortness of breath. 1 Inhaler 1  . FLUoxetine (PROZAC) 40 MG capsule Take 1 capsule (40 mg total) by mouth daily. 30 capsule 0  . etonogestrel (NEXPLANON) 68 MG IMPL implant 1 each by Subdermal route once.    . hydrOXYzine (ATARAX/VISTARIL) 25 MG tablet Take 1 tablet (25 mg total) by mouth at bedtime as needed. (Patient not taking: Reported on 01/08/2019) 30 tablet 0  . mupirocin ointment (BACTROBAN) 2 % Place 1 application into the nose 2 (two) times daily. 22 g 0   No current facility-administered medications for this visit.      Psychiatric Specialty Exam: Review of Systems  Neurological: Positive for headaches.  Psychiatric/Behavioral: Positive for depression.  All other systems reviewed and are negative.   There were no vitals taken for this visit.There is no height or weight on file to calculate BMI.  General Appearance: Casual and Fairly Groomed  Eye Contact:  Good  Speech:  Clear and Coherent and Normal Rate  Volume:  Normal  Mood:  Minimally depreessed.  Affect:  Full Range  Thought Process:  Descriptions of Associations: Circumstantial  Orientation:  Full (Time, Place, and Person)  Thought Content: Logical   Suicidal Thoughts:  No  Homicidal Thoughts:  No  Memory:  Immediate;   Fair Recent;    Good Remote;   Good  Judgement:  Fair  Insight:  Fair  Psychomotor Activity:  Normal  Concentration:  Concentration: Fair  Recall:  [employer] of Knowledge: Fair  Language: Good  Akathisia:  Negative  Handed:  Right  AIMS (if indicated): not done  Assets:  Communication Skills Desire for Improvement Housing Leisure Time Resilience Social Support  ADL's:  Intact  Cognition: WNL  Sleep:  Good   Screenings: AIMS     Admission (Discharged) from 12/21/2018 in [place] [facility]  AIMS Total Score  0    AUDIT     Ad[facility] (Dis[facility]  [facility]  Alcohol Use Disorder Identification Test Final Score (AUDIT)  0    PHQ2-9     [facility]  PHQ-2 Total Score  0  PHQ-9 Total Score  0       Assessment and Plan: 23 yo single white famale with hx of depression and past dx of ADD whose mood recently declined to the point that she started to feel suicidal and cut her right wrist. She was admitted to [facility] on 12/21/18 and stayed for 5 days. She has been depressed on and off for close to 3 years. She associates onset of depression with her having to leave college (was at [employer]) and end of her volleyball career due to recurrent concussions. She admits to having previous SI but never attempted suicide before. She was not der care of a psychiatrist but was prescribed fluoxetine 20 mg by her PCP. Dose of fluoxetine was doubled during this hospitalization and her mood has quickly improved. Sh denies having SI, denies problems with sleep or appetite, her affect is bright, denies having excessive worrying and has not used hydroxyzine since [facility]. Some mild fatigue is long standing. She also has recurrent headaches. She continues to smoke cannabis but only from time to time. Alcohol every other weekend. She had been dx with  having ADHD and was on Adderall then  Strattera - reports that she did not like either and does not wish to be on any medications for ADD. There is no hx of mania or psychosis.   Dx: MDD recurrent, moderate to mild; ADHD by hx  Plan: She appears to have responded very well to increased 40 mg dose of fluoxetine - continue unchanged - [facility] and reports good sleep. DC hydroxyzine - she has not been needing or taking it. She will call front desk to ask for counseling appointment. Next visit with me in one month. The plan was discussed with patient who had an opportunity to ask questions and these were all answered. I spend 45 minutes in videoconferencing with the patient and devoted approximately 50% of this time to explanation of diagnosis, discussion of treatment options and med education.  Magdalene Patricialgierd A Berneice Zettlemoyer, MD 01/08/2019, 1:20 PM

## 2019-01-30 ENCOUNTER — Ambulatory Visit (INDEPENDENT_AMBULATORY_CARE_PROVIDER_SITE_OTHER): Payer: BC Managed Care – PPO | Admitting: Professional

## 2019-01-30 ENCOUNTER — Ambulatory Visit (HOSPITAL_COMMUNITY): Payer: BC Managed Care – PPO | Admitting: Licensed Clinical Social Worker

## 2019-01-30 DIAGNOSIS — F331 Major depressive disorder, recurrent, moderate: Secondary | ICD-10-CM

## 2019-02-05 ENCOUNTER — Ambulatory Visit: Payer: Self-pay | Admitting: Professional

## 2019-02-13 ENCOUNTER — Other Ambulatory Visit: Payer: Self-pay

## 2019-02-13 ENCOUNTER — Ambulatory Visit (INDEPENDENT_AMBULATORY_CARE_PROVIDER_SITE_OTHER): Payer: BC Managed Care – PPO | Admitting: Psychiatry

## 2019-02-13 DIAGNOSIS — F900 Attention-deficit hyperactivity disorder, predominantly inattentive type: Secondary | ICD-10-CM | POA: Diagnosis not present

## 2019-02-13 DIAGNOSIS — F3341 Major depressive disorder, recurrent, in partial remission: Secondary | ICD-10-CM

## 2019-02-13 MED ORDER — ZOLPIDEM TARTRATE ER 6.25 MG PO TBCR: 6.2500 mg | EXTENDED_RELEASE_TABLET | Freq: Every evening | ORAL | 1 refills | Status: DC | PRN

## 2019-02-13 MED ORDER — FLUOXETINE HCL 40 MG PO CAPS: 40.0000 mg | ORAL_CAPSULE | Freq: Every day | ORAL | 0 refills | Status: DC

## 2019-02-13 NOTE — Progress Notes (Signed)
BH MD/PA/NP OP Progress Note  02/13/2019 9:43 AM Tabitha Hawkins  MRN:  [id number] Interview was conducted by phone and I verified that I was speaking with the correct person using two identifiers. I discussed the limitations of evaluation and management by telemedicine and  the availability of in person appointments. Patient expressed understanding and agreed to proceed.  Chief Complaint: Occasional insomnia.  HPI: 23 yo single white famale with hx of depression and past dx of ADD whose mood recently declined to the point that she started to feel suicidal and cut her right wrist. She was [facility] on 12[facility]21[facility]18 and stayed for 5 days. She associates onset of depression with her having to leave college (was at [employer]) and end of her volleyball career due to recurrent concussions. She admits to having previous SI but never attempted suicide before. She was prescribed fluoxetine 20 mg by her PCP. Dose of fluoxetine was doubled during this hospitalization and her mood has quickly improved. Sh denies having SI, denies problems with appetite, denies having excessive worrying and has not used hydroxyzine since [facility]. She reports occasional problems with falling asleep - hydroxyzine was not effective for that. She denies smoking cannabis recently.  She had been dx with having ADHD and was on Adderall then  Strattera - reports that she did not like either and does not wish to be on any medications for ADD. Denny Peonrin reports staying busy "working a lot".    Visit Diagnosis:    ICD-10-CM   1. Attention deficit hyperactivity disorder (ADHD), predominantly inattentive type  F90.0   2. Major depressive disorder, recurrent episode, in partial remission (HCC)  F33.41     Past Psychiatric History: Please see intake H&P.  Past Medical History:  Past Medical History:  Diagnosis Date  . ADHD (attention deficit hyperactivity disorder)   . H/O multiple concussions    seen by neuro  .  Headache    frequent  . Short-term memory loss    cause from multiple concussions?    Past Surgical History:  Procedure Laterality Date  . BUNIONECTOMY    . KNEE ARTHROSCOPY Left 2016    Family Psychiatric History: Reviewed.  Family History:  Family History  Adopted: Yes  Problem Relation Age of Onset  . Drug abuse Mother     Social History:  Social History   Socioeconomic History  . Marital status: Single    Spouse name: Not on file  . Number of children: 0  . Years of education: Not on file  . Highest education level: Not on file  Occupational History  . Occupation: Wellsite geologistwaitress   Social Needs  . Financial resource strain: Not on file  . Food insecurity    Worry: Not on file    Inability: Not on file  . Transportation needs    Medical: Not on file    Non-medical: Not on file  Tobacco Use  . Smoking status: Never Smoker  . Smokeless tobacco: Never Used  Substance and Sexual Activity  . Alcohol use: Yes    Comment: occ  . Drug use: Yes    Types: Marijuana  . Sexual activity: Yes    Birth control/protection: Implant  Lifestyle  . Physical activity    Days per week: Not on file    Minutes per session: Not on file  . Stress: Not on file  Relationships  . Social connections    Talks on phone: Not on file  Gets together: Not on file    Attends religious service: Not on file    Active member of club or organization: Not on file    Attends meetings of clubs or organizations: Not on file    Relationship status: Not on file  Other Topics Concern  . Not on file  Social History Narrative   Household-- father and mother    Stopped college [employer], [place] d/t a concussion, couldn't play volley-ball 05-2015        Allergies: No Known Allergies  Metabolic Disorder Labs: Lab Results  Component Value Date   HGBA1C 4.3 (L) 12/22/2018   MPG 76.71 12/22/2018   No results found for: PROLACTIN Lab Results  Component Value Date   CHOL 207 (H) 12/22/2018    TRIG 89 12/22/2018   HDL 106 12/22/2018   CHOLHDL 2.0 12/22/2018   VLDL 18 12/22/2018   LDLCALC 83 12/22/2018   Lab Results  Component Value Date   TSH 3.452 12/22/2018   TSH 0.79 07/29/2016    Therapeutic Level Labs: No results found for: LITHIUM No results found for: VALPROATE No components found for:  CBMZ  Current Medications: Current Outpatient Medications  Medication Sig Dispense Refill  . albuterol (VENTOLIN HFA) 108 (90 Base) MCG/ACT inhaler Inhale 2 puffs into the lungs every 6 (six) hours as needed for wheezing or shortness of breath. 1 Inhaler 1  . etonogestrel (NEXPLANON) 68 MG IMPL implant 1 each by Subdermal route once.    Marland Kitchen FLUoxetine (PROZAC) 40 MG capsule Take 1 capsule (40 mg total) by mouth daily. 90 capsule 0  . mupirocin ointment (BACTROBAN) 2 % Place 1 application into the nose 2 (two) times daily. 22 g 0  . zolpidem (AMBIEN CR) 6.25 MG CR tablet Take 1 tablet (6.25 mg total) by mouth at bedtime as needed for sleep. 30 tablet 1   No current facility-administered medications for this visit.      Psychiatric Specialty Exam: Review of Systems  Neurological: Positive for headaches.  Psychiatric/Behavioral: The patient has insomnia.   All other systems reviewed and are negative.   There were no vitals taken for this visit.There is no height or weight on file to calculate BMI.  General Appearance: NA  Eye Contact:  NA  Speech:  Clear and Coherent and Normal Rate  Volume:  Normal  Mood:  Euthymic  Affect:  NA  Thought Process:  Goal Directed and Linear  Orientation:  Full (Time, Place, and Person)  Thought Content: Logical   Suicidal Thoughts:  No  Homicidal Thoughts:  No  Memory:  Immediate;   Good Recent;   Good Remote;   Good  Judgement:  Good  Insight:  Good  Psychomotor Activity:  NA  Concentration:  Concentration: Fair  Recall:  Good  Fund of Knowledge: Good  Language: Good  Akathisia:  Negative  Handed:  Right  AIMS (if indicated):  not done  Assets:  Communication Skills Desire for Improvement Financial Resources/Insurance Housing Physical Health Resilience Talents/Skills  ADL's:  Intact  Cognition: WNL  Sleep:  Fair   Screenings: AIMS     Admission (Discharged) from 12/21/2018 in [place] [facility]  AIMS Total Score  0    AUDIT     Admission (Discharged) from 12/21/2018 in [place] [facility]  Alcohol Use Disorder Identification Test Final Score (AUDIT)  0    PHQ2-9     Office Visit from [facility]/11/2018 in [facility]  PHQ-2 Total Score  0  PHQ-9 Total Score  0       Assessment and Plan: 23 yo single white famale with hx of depression and past dx of ADD whose mood recently declined to the point that she started to feel suicidal and cut her right wrist. She was admitted to [facility] on 12/21/18 and stayed for 5 days. She associates onset of depression with her having to leave college (was at [employer]) and end of her volleyball career due to recurrent concussions. She admits to having previous SI but never attempted suicide before. She was prescribed fluoxetine 20 mg by her PCP. Dose of fluoxetine was doubled during this hospitalization and her mood has quickly improved. Sh denies having SI, denies problems with appetite, denies having excessive worrying and has not used hydroxyzine since discharge from  [facility]. She reports occasional problems with falling asleep - hydroxyzine was not effective for that. She denies smoking cannabis recently.  She had been dx with having ADHD and was on Adderall then  Strattera - reports that she did not like either and does not wish to be on any medications for ADD. Felisita reports staying busy "working a lot".   Dx: MDD recurrent, in partial remission; ADHD by hx  Plan: Continue 40 mg dose of fluoxetine at HS. Add zolpidem 6.25 mg prn insomnia (CR is on her insurance formulary whereas  regular is not).  Next visit with me in two months. The plan was discussed with patient who had an opportunity to ask questions and these were all answered. I spend 25 minutes in phone consultation with the patient    Magdalene Patricia, MD 02/13/2019, 9:43 AM

## 2019-02-20 ENCOUNTER — Ambulatory Visit: Payer: Self-pay | Admitting: Professional

## 2019-02-20 DIAGNOSIS — N939 Abnormal uterine and vaginal bleeding, unspecified: Secondary | ICD-10-CM | POA: Diagnosis not present

## 2019-02-27 ENCOUNTER — Ambulatory Visit (INDEPENDENT_AMBULATORY_CARE_PROVIDER_SITE_OTHER): Payer: BC Managed Care – PPO | Admitting: Professional

## 2019-02-27 DIAGNOSIS — F331 Major depressive disorder, recurrent, moderate: Secondary | ICD-10-CM | POA: Diagnosis not present

## 2019-03-06 ENCOUNTER — Ambulatory Visit: Payer: BC Managed Care – PPO | Admitting: Professional

## 2019-03-14 ENCOUNTER — Ambulatory Visit: Payer: BC Managed Care – PPO | Admitting: Professional

## 2019-03-15 ENCOUNTER — Ambulatory Visit (INDEPENDENT_AMBULATORY_CARE_PROVIDER_SITE_OTHER): Payer: BC Managed Care – PPO | Admitting: Professional

## 2019-03-15 DIAGNOSIS — F331 Major depressive disorder, recurrent, moderate: Secondary | ICD-10-CM

## 2019-03-20 ENCOUNTER — Ambulatory Visit: Payer: BC Managed Care – PPO | Admitting: Professional

## 2019-04-03 ENCOUNTER — Other Ambulatory Visit: Payer: Self-pay

## 2019-04-03 ENCOUNTER — Ambulatory Visit (INDEPENDENT_AMBULATORY_CARE_PROVIDER_SITE_OTHER): Payer: BC Managed Care – PPO | Admitting: Internal Medicine

## 2019-04-03 DIAGNOSIS — Z20822 Contact with and (suspected) exposure to covid-19: Secondary | ICD-10-CM

## 2019-04-03 DIAGNOSIS — J4521 Mild intermittent asthma with (acute) exacerbation: Secondary | ICD-10-CM

## 2019-04-03 DIAGNOSIS — Z20828 Contact with and (suspected) exposure to other viral communicable diseases: Secondary | ICD-10-CM

## 2019-04-03 NOTE — Progress Notes (Signed)
Subjective:    Patient ID: Tabitha Hawkins, female    DOB: [birth year], 23 y.o.   MRN: [id number]  DOS:  04/03/2019 Type of visit - description: Virtual Visit via Video Note  I connected with@   by a video enabled telemedicine application and verified that I am speaking with the correct person using two identifiers.   THIS ENCOUNTER IS A VIRTUAL VISIT DUE TO COVID-19 - PATIENT WAS NOT SEEN IN THE OFFICE. PATIENT HAS CONSENTED TO VIRTUAL VISIT / TELEMEDICINE VISIT   Location of patient: home  Location of provider: office  I discussed the limitations of evaluation and management by telemedicine and the availability of in person appointments. The patient expressed understanding and agreed to proceed.  History of Present Illness: Acute Symptoms started approximately 5 to 6 days ago Cough with mild amount of clear sputum Chest congestion and wheezing, mild, uses albuterol on average every other day and the use has not increased.  Had Sinus congestion which is now resolved She works at [employer], exposed to [employer] however not known COVID-19 exposure.    Review of Systems Denies fever chills No nausea, vomiting, diarrhea No difficulty breathing, mild chest pain with cough, no headaches.  Past Medical History:  Diagnosis Date  . ADHD (attention deficit hyperactivity disorder)   . H/O multiple concussions    seen by neuro  . Headache    frequent  . Short-term memory loss    cause from multiple concussions?    Past Surgical History:  Procedure Laterality Date  . BUNIONECTOMY    . KNEE ARTHROSCOPY Left 2016    Social History   Socioeconomic History  . Marital status: Single    Spouse name: Not on file  . Number of children: 0  . Years of education: Not on file  . Highest education level: Not on file  Occupational History  . Occupation: Wellsite geologist  . Financial resource strain: Not on file  . Food insecurity    Worry: Not on file    Inability: Not on  file  . Transportation needs    Medical: Not on file    Non-medical: Not on file  Tobacco Use  . Smoking status: Never Smoker  . Smokeless tobacco: Never Used  Substance and Sexual Activity  . Alcohol use: Yes    Comment: occ  . Drug use: Yes    Types: Marijuana  . Sexual activity: Yes    Birth control/protection: Implant  Lifestyle  . Physical activity    Days per week: Not on file    Minutes per session: Not on file  . Stress: Not on file  Relationships  . Social Musician on phone: Not on file    Gets together: Not on file    Attends religious service: Not on file    Active member of club or organization: Not on file    Attends meetings of clubs or organizations: Not on file    Relationship status: Not on file  . Intimate partner violence    Fear of current or ex partner: Not on file    Emotionally abused: Not on file    Physically abused: Not on file    Forced sexual activity: Not on file  Other Topics Concern  . Not on file  Social History Narrative   Household-- father and mother    Stopped college [employer], [place] d/t a concussion, couldn't play volley-ball [phone]  Allergies as of 04/03/2019   No Known Allergies     Medication List       Accurate as of April 03, 2019  4:17 PM. If you have any questions, ask your nurse or doctor.        albuterol 108 (90 Base) MCG/ACT inhaler Commonly known as: Ventolin HFA Inhale 2 puffs into the lungs every 6 (six) hours as needed for wheezing or shortness of breath.   etonogestrel 68 MG Impl implant Commonly known as: NEXPLANON 1 each by Subdermal route once.   FLUoxetine 40 MG capsule Commonly known as: PROZAC Take 1 capsule (40 mg total) by mouth daily.   mupirocin ointment 2 % Commonly known as: Bactroban Place 1 application into the nose 2 (two) times daily.   zolpidem 6.25 MG CR tablet Commonly known as: AMBIEN CR Take 1 tablet (6.25 mg total) by mouth at bedtime as needed  for sleep.           Objective:   Physical Exam There were no vitals taken for this visit. This is a virtual video visit, she is alert oriented x3, speaking in complete sentences, no apparent distress    Assessment      Assessment Asthma PSYCH/NEURO --ADD- Saw a psychologist 2015, DX ADHD, inattentive type. Rx adderall  08-2013 --anxiety Depression: Admitted with s/i 12-2018 --Postconcussion syndrome: DX 05-2015, saw neuro- d/c college sports  --Neuropsychological evaluation 08/13/2015 referred by neurology, report reviewed, dx  ADD with hyperactivity; + sx of  postconcussion including headaches.    PLAN: Asthma exacerbation Mild asthma exacerbation, no fevers, no distress. We will check for COVID-19 Otherwise conservative treatment with rest, fluids, Mucinex DM, increase albuterol use to 2-3 times a day until better. Recommend not to go back to work until she feels better and COVID-19 test is negative. She verbalized understanding.      I discussed the assessment and treatment plan with the patient. The patient was provided an opportunity to ask questions and all were answered. The patient agreed with the plan and demonstrated an understanding of the instructions.   The patient was advised to call back or seek an in-person evaluation if the symptoms worsen or if the condition fails to improve as anticipated.

## 2019-04-04 ENCOUNTER — Other Ambulatory Visit: Payer: Self-pay

## 2019-04-04 DIAGNOSIS — Z20822 Contact with and (suspected) exposure to covid-19: Secondary | ICD-10-CM

## 2019-04-04 DIAGNOSIS — Z20828 Contact with and (suspected) exposure to other viral communicable diseases: Secondary | ICD-10-CM | POA: Diagnosis not present

## 2019-04-04 NOTE — Assessment & Plan Note (Signed)
Asthma exacerbation Mild asthma exacerbation, no fevers, no distress. We will check for COVID-19 Otherwise conservative treatment with rest, fluids, Mucinex DM, increase albuterol use to 2-3 times a day until better. Recommend not to go back to work until she feels better and COVID-19 test is negative. She verbalized understanding.

## 2019-04-05 LAB — NOVEL CORONAVIRUS, NAA: SARS-CoV-2, NAA: NOT DETECTED

## 2019-04-10 ENCOUNTER — Telehealth: Payer: Self-pay | Admitting: Internal Medicine

## 2019-04-10 MED ORDER — PREDNISONE 10 MG PO TABS: ORAL_TABLET | ORAL | 0 refills | Status: DC

## 2019-04-10 NOTE — Telephone Encounter (Signed)
Please advise 

## 2019-04-10 NOTE — Telephone Encounter (Signed)
LMOM informing Pt of recommendations. Instructed to call if questions/concerns.  

## 2019-04-10 NOTE — Telephone Encounter (Signed)
Cough felt to be asthma exacerbation, started Symbicort.  Advise patient: -I sent a round of prednisone, continue Symbicort and her regular medications. -In person visit next week if not better (as long as she does not develop fever, chills, myalgias or other Covid symptoms)

## 2019-04-10 NOTE — Telephone Encounter (Signed)
Patient called and wanted to let Dr. Larose Kells know that her cough hasn't got better and would like for him to call something in to her pharmacy. Please call patient back , if she doesn't pick up please call mom. Jemeka, Wagler (Mother) [phone]

## 2019-04-24 ENCOUNTER — Other Ambulatory Visit: Payer: Self-pay

## 2019-04-24 ENCOUNTER — Ambulatory Visit (INDEPENDENT_AMBULATORY_CARE_PROVIDER_SITE_OTHER): Payer: BC Managed Care – PPO | Admitting: Psychiatry

## 2019-04-24 DIAGNOSIS — F988 Other specified behavioral and emotional disorders with onset usually occurring in childhood and adolescence: Secondary | ICD-10-CM

## 2019-04-24 DIAGNOSIS — F3342 Major depressive disorder, recurrent, in full remission: Secondary | ICD-10-CM

## 2019-04-24 MED ORDER — ZOLPIDEM TARTRATE ER 6.25 MG PO TBCR: 6.2500 mg | EXTENDED_RELEASE_TABLET | Freq: Every evening | ORAL | 2 refills | Status: DC | PRN

## 2019-04-24 MED ORDER — FLUOXETINE HCL 40 MG PO CAPS: 40.0000 mg | ORAL_CAPSULE | Freq: Every day | ORAL | 0 refills | Status: DC

## 2019-04-24 NOTE — Progress Notes (Signed)
[place] MD/PA/NP OP Progress Note  04/24/2019 10:06 AM Tabitha Hawkins  MRN:  [id number] Interview was conducted by phone and I verified that I was speaking with the correct person using two identifiers. I discussed the limitations of evaluation and management by telemedicine and  the availability of in person appointments. Patient expressed understanding and agreed to proceed.  Chief Complaint: "I feel fine".  HPI: 23 yo single white famale with hx of depression and past dx of ADD whose mood recently declined to the point that she started to feel suicidal and cut her right wrist. She was admitted to [facility] on 12/21/18 and stayed for 5 days. She associates onset of depression with her having to leave college (was at [employer]) and end of her volleyball career due to recurrent concussions. She admits to having previous SI but never attempted suicide before. She was prescribed fluoxetine 20 mg by her PCP. Dose of fluoxetine was doubled during this hospitalization and her mood has quickly improved. She is in remission of depressive symptoms, denies having SI, denies problems with appetite, denies having excessive worrying and has not used hydroxyzine since discharge from [facility]. She reports much improved sleep (takes Ambien occasionally). She denies smoking cannabis recently.  She had been dx with having ADHD and was on Adderall then Strattera - reports that she did not like either and does not wish to be on any medications for ADD. Raenette reports staying busy "working a lot".   Dx: MDD recurrent, in remission; ADHD by hx  Plan: Continue 40 mg dose of fluoxetine at HS and zolpidem CR 6.25 mg prn insomnia.  Next visit with me in three months.The plan was discussed with patient who had an opportunity to ask questions and these were all answered. I spend 25 minutes inphone consultationwith the patient Visit Diagnosis:    ICD-10-CM   1. Major depressive disorder, recurrent episode, in full remission  ([place])  F33.42   2. Attention deficit disorder (ADD) without hyperactivity  F98.8     Past Psychiatric History: Please see intake H&P.  Past Medical History:  Past Medical History:  Diagnosis Date  . ADHD (attention deficit hyperactivity disorder)   . H/O multiple concussions    seen by neuro  . Headache    frequent  . Short-term memory loss    cause from multiple concussions?    Past Surgical History:  Procedure Laterality Date  . BUNIONECTOMY    . KNEE ARTHROSCOPY Left 2016    Family Psychiatric History: Reviewed.  Family History:  Family History  Adopted: Yes  Problem Relation Age of Onset  . Drug abuse Mother     Social History:  Social History   Socioeconomic History  . Marital status: Single    Spouse name: Not on file  . Number of children: 0  . Years of education: Not on file  . Highest education level: Not on file  Occupational History  . Occupation: Optometrist  . Financial resource strain: Not on file  . Food insecurity    Worry: Not on file    Inability: Not on file  . Transportation needs    Medical: Not on file    Non-medical: Not on file  Tobacco Use  . Smoking status: Never Smoker  . Smokeless tobacco: Never Used  Substance and Sexual Activity  . Alcohol use: Yes    Comment: occ  . Drug use: Yes    Types: Marijuana  . Sexual activity: Yes  Birth control/protection: Implant  Lifestyle  . Physical activity    Days per week: Not on file    Minutes per session: Not on file  . Stress: Not on file  Relationships  . Social Musicianconnections    Talks on phone: Not on file    Gets together: Not on file    Attends religious service: Not on file    Active member of club or organization: Not on file    Attends meetings of clubs or organizations: Not on file    Relationship status: Not on file  Other Topics Concern  . Not on file  Social History Narrative   Household-- father and mother    Stopped [employer]  d/t a concussion, couldn't play volley-ball 05-2015        Allergies: No Known Allergies  Metabolic Disorder Labs: Lab Results  Component Value Date   HGBA1C 4.3 (L) 12/22/2018   MPG 76.71 12/22/2018   No results found for: PROLACTIN Lab Results  Component Value Date   CHOL 207 (H) 12/22/2018   TRIG 89 12/22/2018   HDL 106 12/22/2018   CHOLHDL 2.0 12/22/2018   VLDL 18 12/22/2018   LDLCALC 83 12/22/2018   Lab Results  Component Value Date   TSH 3.452 12/22/2018   TSH 0.79 07/29/2016    Therapeutic Level Labs: No results found for: LITHIUM No results found for: VALPROATE No components found for:  CBMZ  Current Medications: Current Outpatient Medications  Medication Sig Dispense Refill  . albuterol (VENTOLIN HFA) 108 (90 Base) MCG/ACT inhaler Inhale 2 puffs into the lungs every 6 (six) hours as needed for wheezing or shortness of breath. 1 Inhaler 1  . etonogestrel (NEXPLANON) 68 MG IMPL implant 1 each by Subdermal route once.    Tabitha Hawkins. [START ON 05/14/2019] FLUoxetine (PROZAC) 40 MG capsule Take 1 capsule (40 mg total) by mouth daily. 90 capsule 0  . predniSONE (DELTASONE) 10 MG tablet 4 tablets x 2 days, 3 tabs x 2 days, 2 tabs x 2 days, 1 tab x 2 days 20 tablet 0  . zolpidem (AMBIEN CR) 6.25 MG CR tablet Take 1 tablet (6.25 mg total) by mouth at bedtime as needed for sleep. 30 tablet 2   No current facility-administered medications for this visit.      Psychiatric Specialty Exam: Review of Systems  All other systems reviewed and are negative.   There were no vitals taken for this visit.There is no height or weight on file to calculate BMI.  General Appearance: NA  Eye Contact:  NA  Speech:  Clear and Coherent and Normal Rate  Volume:  Normal  Mood:  Euthymic  Affect:  NA  Thought Process:  Goal Directed and Linear  Orientation:  Full (Time, Place, and Person)  Thought Content: Logical   Suicidal Thoughts:  No  Homicidal Thoughts:  No  Memory:  Immediate;    Good Recent;   Good Remote;   Good  Judgement:  Good  Insight:  Good  Psychomotor Activity:  NA  Concentration:  Concentration: Fair  Recall:  Good  Fund of Knowledge: Good  Language: Good  Akathisia:  Negative  Handed:  Right  AIMS (if indicated): not done  Assets:  Communication Skills Desire for Improvement Financial Resources/Insurance Housing Physical Health Social Support Talents/Skills  ADL's:  Intact  Cognition: WNL  Sleep:  Good   Screenings: AIMS     [facility]  AIMS Total Score  0    AUDIT     [facility]  Alcohol Use Disorder Identification Test Final Score (AUDIT)  0    PHQ2-9     Office Visit from 05/11/2018 in [facility]  PHQ-2 Total Score  0  PHQ-9 Total Score  0       Assessment and Plan: 23 yo single white famale with hx of depression and past dx of ADD whose mood recently declined to the point that she started to feel suicidal and cut her right wrist. She was admitted to [facility] on 12/21/18 and stayed for 5 days. She associates onset of depression with her having to leave college (was at [employer]) and end of her volleyball career due to recurrent concussions. She admits to having previous SI but never attempted suicide before. She was prescribed fluoxetine 20 mg by her PCP. Dose of fluoxetine was doubled during this hospitalization and her mood has quickly improved. She is in remission of depressive symptoms, denies having SI, denies problems with appetite, denies having excessive worrying and has not used hydroxyzine since discharge from [facility]. She reports much improved sleep (takes Ambien occasionally). She denies smoking cannabis recently.  She had been dx with having ADHD and was on Adderall then Strattera - reports that she did not like either and does  not wish to be on any medications for ADD. Ahyana reports staying busy "working a lot".   Dx: MDD recurrent, in remission; ADHD by hx  Plan: Continue 40 mg dose of fluoxetine at HS and zolpidem CR 6.25 mg prn insomnia.  Next visit with me in three months.The plan was discussed with patient who had an opportunity to ask questions and these were all answered. I spend 25 minutes inphone consultationwith the patient    Magdalene Patricia, MD 04/24/2019, 10:06 AM

## 2019-05-02 ENCOUNTER — Other Ambulatory Visit: Payer: Self-pay

## 2019-05-02 ENCOUNTER — Encounter: Payer: Self-pay | Admitting: Internal Medicine

## 2019-05-02 ENCOUNTER — Ambulatory Visit (INDEPENDENT_AMBULATORY_CARE_PROVIDER_SITE_OTHER): Payer: BC Managed Care – PPO | Admitting: Internal Medicine

## 2019-05-02 VITALS — BP 117/72 | HR 78 | Temp 97.3°F | Resp 16 | Ht 69.0 in | Wt 171.1 lb

## 2019-05-02 DIAGNOSIS — Z23 Encounter for immunization: Secondary | ICD-10-CM | POA: Diagnosis not present

## 2019-05-02 DIAGNOSIS — F32A Depression, unspecified: Secondary | ICD-10-CM

## 2019-05-02 DIAGNOSIS — F329 Major depressive disorder, single episode, unspecified: Secondary | ICD-10-CM

## 2019-05-02 DIAGNOSIS — J4520 Mild intermittent asthma, uncomplicated: Secondary | ICD-10-CM

## 2019-05-02 DIAGNOSIS — F419 Anxiety disorder, unspecified: Secondary | ICD-10-CM

## 2019-05-02 NOTE — Progress Notes (Signed)
Pre visit review using our clinic review tool, if applicable. No additional management support is needed unless otherwise documented below in the visit note. 

## 2019-05-02 NOTE — Patient Instructions (Signed)
  GO TO THE FRONT DESK Schedule your next appointment   for a physical exam, in 6 months  Call for refills when needed

## 2019-05-02 NOTE — Progress Notes (Signed)
Subjective:    Patient ID: Tabitha Hawkins, female    DOB: 1995-08-29, 23 y.o.   MRN: [id number]  DOS:  05/02/2019 Type of visit - description: Routine checkup In general feeling very well Asthma: No current symptoms, not reaching for albuterol lately Depression: Good compliance with medications. Feels well. Insomnia: On Ambien as needed only, most nights take OTCs  Review of Systems  Denies chest pain or difficulty breathing No suicidal ideas EtOH: In moderation, maybe 2-3 times a week, not excessively.  Past Medical History:  Diagnosis Date  . ADHD (attention deficit hyperactivity disorder)   . H/O multiple concussions    seen by neuro  . Headache    frequent  . Short-term memory loss    cause from multiple concussions?    Past Surgical History:  Procedure Laterality Date  . BUNIONECTOMY    . KNEE ARTHROSCOPY Left 2016    Social History   Socioeconomic History  . Marital status: Single    Spouse name: Not on file  . Number of children: 0  . Years of education: Not on file  . Highest education level: Not on file  Occupational History  . Occupation: Optometrist  . Financial resource strain: Not on file  . Food insecurity    Worry: Not on file    Inability: Not on file  . Transportation needs    Medical: Not on file    Non-medical: Not on file  Tobacco Use  . Smoking status: Never Smoker  . Smokeless tobacco: Never Used  Substance and Sexual Activity  . Alcohol use: Yes    Comment: occ  . Drug use: Yes    Types: Marijuana  . Sexual activity: Yes    Birth control/protection: Implant  Lifestyle  . Physical activity    Days per week: Not on file    Minutes per session: Not on file  . Stress: Not on file  Relationships  . Social Herbalist on phone: Not on file    Gets together: Not on file    Attends religious service: Not on file    Active member of club or organization: Not on file    Attends meetings of clubs or  organizations: Not on file    Relationship status: Not on file  . Intimate partner violence    Fear of current or ex partner: Not on file    Emotionally abused: Not on file    Physically abused: Not on file    Forced sexual activity: Not on file  Other Topics Concern  . Not on file  Social History Narrative   Household-- father and mother    Stopped college [employer], [place] d/t a concussion, couldn't play volley-ball 05-2015          Allergies as of 05/02/2019   No Known Allergies     Medication List       Accurate as of May 02, 2019  9:10 AM. If you have any questions, ask your nurse or doctor.        albuterol 108 (90 Base) MCG/ACT inhaler Commonly known as: Ventolin HFA Inhale 2 puffs into the lungs every 6 (six) hours as needed for wheezing or shortness of breath.   etonogestrel 68 MG Impl implant Commonly known as: NEXPLANON 1 each by Subdermal route once.   FLUoxetine 40 MG capsule Commonly known as: PROZAC Take 1 capsule (40 mg total) by mouth daily. Start taking on: May 14, 2019   predniSONE 10 MG tablet Commonly known as: DELTASONE 4 tablets x 2 days, 3 tabs x 2 days, 2 tabs x 2 days, 1 tab x 2 days   zolpidem 6.25 MG CR tablet Commonly known as: AMBIEN CR Take 1 tablet (6.25 mg total) by mouth at bedtime as needed for sleep.           Objective:   Physical Exam BP 117/72 (BP Location: Left Arm, Patient Position: Sitting, Cuff Size: Small)   Pulse 78   Temp (!) 97.3 F (36.3 C) (Temporal)   Resp 16   Ht 5\' 9"  (1.753 m)   Wt 171 lb 2 oz (77.6 kg)   SpO2 100%   BMI 25.27 kg/m  General:   Well developed, NAD, BMI noted. HEENT:  Normocephalic . Face symmetric, atraumatic Lungs:  CTA B Normal respiratory effort, no intercostal retractions, no accessory muscle use. Heart: RRR,  no murmur.  No pretibial edema bilaterally  Skin: Not pale. Not jaundice Neurologic:  alert & oriented X3.  Speech normal, gait appropriate for  age and unassisted Psych--  Cognition and judgment appear intact.  Cooperative with normal attention span and concentration.  Behavior appropriate. No anxious or depressed appearing.      Assessment    Assessment Asthma PSYCH/NEURO --ADD- Saw a psychologist 2015, DX ADHD, inattentive type. Rx adderall  08-2013 --anxiety Depression: Admitted with s/i 12-2018 --Postconcussion syndrome: DX 05-2015, saw neuro- d/c college sports  --Neuropsychological evaluation 08/13/2015 referred by neurology, report reviewed, dx  ADD with hyperactivity; + sx of  postconcussion including headaches.    PLAN: Asthma: Hardly ever uses Ventolin lately.  Doing great Anxiety, depression, insomnia: Currently on fluoxetine, symptoms controlled.  Takes Ambien as needed only, mostly using OTC sleeping aids.  Does not see a counselor currently but again she is doing well.  RF medications as needed Preventive care: Flu shot today Social: Patient is a 08/15/2015, currently working, lives with a roommate, in good relationship with her parents RTC 6 months

## 2019-05-03 NOTE — Assessment & Plan Note (Signed)
Asthma: Hardly ever uses Ventolin lately.  Doing great Anxiety, depression, insomnia: Currently on fluoxetine, symptoms controlled.  Takes Ambien as needed only, mostly using OTC sleeping aids.  Does not see a counselor currently but again she is doing well.  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

## 2019-05-08 DIAGNOSIS — Z309 Encounter for contraceptive management, unspecified: Secondary | ICD-10-CM | POA: Diagnosis not present

## 2019-05-08 DIAGNOSIS — N939 Abnormal uterine and vaginal bleeding, unspecified: Secondary | ICD-10-CM | POA: Diagnosis not present

## 2019-05-14 DIAGNOSIS — Z30430 Encounter for insertion of intrauterine contraceptive device: Secondary | ICD-10-CM | POA: Diagnosis not present

## 2019-05-14 DIAGNOSIS — Z9889 Other specified postprocedural states: Secondary | ICD-10-CM | POA: Diagnosis not present

## 2019-05-20 ENCOUNTER — Ambulatory Visit (HOSPITAL_COMMUNITY)
Admission: RE | Admit: 2019-05-20 | Discharge: 2019-05-20 | Disposition: A | Discharge status: Home / Self Care | Payer: BC Managed Care – PPO | Attending: Psychiatry | Admitting: Psychiatry

## 2019-05-20 DIAGNOSIS — Z813 Family history of other psychoactive substance abuse and dependence: Secondary | ICD-10-CM | POA: Diagnosis not present

## 2019-05-20 DIAGNOSIS — F900 Attention-deficit hyperactivity disorder, predominantly inattentive type: Secondary | ICD-10-CM | POA: Diagnosis not present

## 2019-05-20 DIAGNOSIS — F332 Major depressive disorder, recurrent severe without psychotic features: Secondary | ICD-10-CM | POA: Diagnosis not present

## 2019-05-20 DIAGNOSIS — R45851 Suicidal ideations: Secondary | ICD-10-CM | POA: Insufficient documentation

## 2019-05-20 DIAGNOSIS — F1220 Cannabis dependence, uncomplicated: Secondary | ICD-10-CM | POA: Insufficient documentation

## 2019-05-20 DIAGNOSIS — F1020 Alcohol dependence, uncomplicated: Secondary | ICD-10-CM | POA: Insufficient documentation

## 2019-05-20 DIAGNOSIS — Z7289 Other problems related to lifestyle: Secondary | ICD-10-CM | POA: Diagnosis not present

## 2019-05-20 NOTE — BH Assessment (Signed)
Assessment Note  Tabitha Hawkins is an 23 y.o. single female who presents to [facility] accompanied by her mother, Chelsie Burel [phone]. Pt states she has a history of depression and ADHD. She reports she has been feeling "down" for the past week and today at work she experienced "a mental breakdown." Pt reports she was tearful, upset and having thoughts that she didn't want to live anymore. Pt says she left work, contacted her parents and decided to come to [place] for help. Pt acknowledges symptoms including crying spells, social withdrawal, loss of interest in usual pleasures, fatigue, irritability, decreased appetite and feelings of worthlessness and hopelessness. She says she was experiencing suicidal ideation with no plan or intent earlier today, stating "I couldn't do that to my family." She denies current suicidal ideation, stating that after talking with her parents she has calmed down. Pt has a history of cutting her wrist in July 2020, which required stiches and resulted in her being involuntarily committed to [facility]. She reports she also superficial cut her leg approximately two weeks ago. She denies current homicidal ideation or history of aggression. She denies any history of hallucinations or other psychotic symptoms.   Pt reports her use of alcohol has increased recently and she can drink up to 15 cans of beer in one day. She says she works a lot of hours and doesn't drink on the job. She denies alcohol withdrawal symptoms. She also reports using marijuana daily. She denies other substance use.  Pt identifies several stressors. 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 She says her sister is getting married and they do not have a good relationship, which Pt explains is stressful. She says she has a new boyfriend and is having difficulty balancing time between him and her best friend. Pt says she shares and apartment with a  friend. She identifies her parents as her primary support and says "I know they always have my back." She says she was adopted and does not know her family history. She denies any history of abuse or trauma. She denies legal problems. She denies access to firearms.  Pt says she is currently receiving medication management with Dr. Alejandro [facility] and is prescribed Prozac and Ambien. Pt says she takes the medications as prescribed. She says she had a therapist but it wasn't a good fit so she stopped going. She reports one previous psychiatric hospitalization at [facility] in July 2020.  Pt is dressed in sweats, hoodie and mask. She is alert and oriented x4. Pt speaks in a clear tone, at moderate volume and normal pace. Motor behavior appears normal. Eye contact is good. Pt's mood is depressed and anxious, affect is congruent with mood. Thought process is coherent and relevant. There is no indication Pt is currently responding to internal stimuli or experiencing delusional thought content. Pt was pleasant and cooperative throughout assessment. She says earlier today she felt she needed to be psychiatrically hospitalized but does not feel that way now. She says when she was discharged from [facility] she was told about the IOP program in is interested in starting that program as soon as possible.  With Pt's permission, this TTS counselor spoke with Pt's mother, Shambhavi Salley. She appeared supportive of patient and expressed no concerns for Pt's safety at this time. She and Pt agreed that Pt will stay with her parents for a few days for additional contact  and support. Pt's mother says she feels confident Pt will follow up with IOP program.   Diagnosis:  F33.2 Major depressive disorder, Recurrent episode, Severe F90.0 Attention-deficit/hyperactivity disorder, Predominantly inattentive presentation F10.20 Alcohol use disorder, Moderate F12.20 Cannabis use disorder, Moderate  Past Medical History:  Past Medical  History:  Diagnosis Date  . ADHD (attention deficit hyperactivity disorder)   . H/O multiple concussions    seen by neuro  . Headache    frequent  . Short-term memory loss    cause from multiple concussions?    Past Surgical History:  Procedure Laterality Date  . BUNIONECTOMY    . KNEE ARTHROSCOPY Left 2016    Family History:  Family History  Adopted: Yes  Problem Relation Age of Onset  . Drug abuse Mother     Social History:  reports that she has never smoked. She has never used smokeless tobacco. She reports current alcohol use. She reports current drug use. Drug: Marijuana.  Additional Social History:  Alcohol / Drug Use Pain Medications: Denies use Prescriptions: Denies abuse Over the Counter: Denies abuse History of alcohol / drug use?: Yes Longest period of sobriety (when/how long): Unknown Negative Consequences of Use: (Pt denies) Withdrawal Symptoms: (Pt denies) Substance #1 Name of Substance 1: Alcohol 1 - Age of First Use: Adolescent 1 - Amount (size/oz): Up to 15 cans of beer 1 - Frequency: Daily 1 - Duration: Ongoing 1 - Last Use / Amount: 05/19/2019 Substance #2 Name of Substance 2: Marijuana 2 - Age of First Use: 17 2 - Amount (size/oz): 4 joints 2 - Frequency: Daily 2 - Duration: Ongoing 2 - Last Use / Amount: 05/20/2019  CIWA:   COWS:    Allergies: No Known Allergies  Home Medications: (Not in a hospital admission)   OB/GYN Status:  No LMP recorded. Patient has had an implant.  General Assessment Data [facility] Assessment: In system Is this a Tele or Face-to-Face Assessment?: Face-to-Face Is this an Initial Assessment or a Re-assessment for this encounter?: Initial Assessment Patient Accompanied by:: Parent Language Other than English: No Living Arrangements: Other (Comment)(Shares apartment with friend) What gender do you identify as?: Female Marital status: Single Maiden name:  NA Pregnancy Status: No Living Arrangements: Non-relatives/Friends Can pt return to current living arrangement?: Yes Admission Status: Voluntary Is patient capable of signing voluntary admission?: Yes Referral Source: Self/Family/Friend Insurance type: BCBS  Medical Screening Exam [facility] Walk-in ONLY) Medical Exam completed: Yes(Jason Allyson SabalBerry, FNP)  Crisis Care Plan Living Arrangements: Non-relatives/Friends Legal Guardian: Other:(Self) Name of Psychiatrist: Dr. Jacquenette Shone. [place] Name of Therapist: None  Education Status Is patient currently in school?: No Is the patient employed, unemployed or receiving disability?: Employed  Risk to self with the past 6 months Suicidal Ideation: No Has patient been a risk to self within the past 6 months prior to admission? : Yes Suicidal Intent: No Has patient had any suicidal intent within the past 6 months prior to admission? : No Is patient at risk for suicide?: No Suicidal Plan?: No Has patient had any suicidal plan within the past 6 months prior to admission? : Yes Access to Means: Yes Specify Access to Suicidal Means: Pt has access to sharps What has been your use of drugs/alcohol within the last 12 months?: Pt reports using alcohol and marijuana daily Previous Attempts/Gestures: Yes How many times?: 1(12/2018- cut wrist) Other Self Harm Risks: Pt reports she superficially cut her leg 2 weeks ago Triggers for Past Attempts: Other  personal contacts Intentional Self Injurious Behavior: Cutting Comment - Self Injurious Behavior: Pt reports she has intentionally cut herself twice Family Suicide History: Unknown(Pt adopted) Recent stressful life event(s): Other (Comment)(Work, new boyfriend, sister getting married) Persecutory voices/beliefs?: No Depression: Yes Depression Symptoms: Despondent, Tearfulness, Isolating, Fatigue, Guilt, Loss of interest in usual pleasures, Feeling worthless/self pity, Feeling angry/irritable Substance abuse  history and/or treatment for substance abuse?: Yes Suicide prevention information given to non-admitted patients: Not applicable  Risk to Others within the past 6 months Homicidal Ideation: No Does patient have any lifetime risk of violence toward others beyond the six months prior to admission? : No Thoughts of Harm to Others: No Current Homicidal Intent: No Current Homicidal Plan: No Access to Homicidal Means: No Identified Victim: None History of harm to others?: No Assessment of Violence: None Noted Violent Behavior Description: Pt denies history of violence Does patient have access to weapons?: No Criminal Charges Pending?: No Does patient have a court date: No Is patient on probation?: No  Psychosis Hallucinations: None noted Delusions: None noted  Mental Status Report Appearance/Hygiene: Other (Comment)(Casually dressed in sweats) Eye Contact: Good Motor Activity: Unremarkable Speech: Logical/coherent Level of Consciousness: Alert Mood: Depressed, Anxious Affect: Appropriate to circumstance Anxiety Level: Minimal Thought Processes: Coherent, Relevant Judgement: Unimpaired Orientation: Person, Place, Time, Situation, Appropriate for developmental age Obsessive Compulsive Thoughts/Behaviors: None  Cognitive Functioning Concentration: Normal Memory: Recent Intact, Remote Intact Is patient IDD: No Insight: Fair Impulse Control: Good Appetite: Fair Have you had any weight changes? : No Change Sleep: No Change Total Hours of Sleep: 8 Vegetative Symptoms: None  ADLScreening [facility] Assessment Services) Patient's cognitive ability adequate to safely complete daily activities?: Yes Patient able to express need for assistance with ADLs?: Yes Independently performs ADLs?: Yes (appropriate for developmental age)  Prior Inpatient Therapy Prior Inpatient Therapy: Yes Prior Therapy Dates: 12/2018 Prior Therapy Facilty/Provider(s): [facility] Reason for Treatment: MDD, cut  wrist  Prior Outpatient Therapy Prior Outpatient Therapy: Yes Prior Therapy Dates: Current Prior Therapy Facilty/Provider(s): Dr. Jacquenette Shone Reason for Treatment: Medication management Does patient have an ACCT team?: No Does patient have Intensive In-House Services?  : No Does patient have [facility] services? : No Does patient have P4CC services?: No  ADL Screening (condition at time of admission) Patient's cognitive ability adequate to safely complete daily activities?: Yes Is the patient deaf or have difficulty hearing?: No Does the patient have difficulty seeing, even when wearing glasses/contacts?: No Does the patient have difficulty concentrating, remembering, or making decisions?: No Patient able to express need for assistance with ADLs?: Yes Does the patient have difficulty dressing or bathing?: No Independently performs ADLs?: Yes (appropriate for developmental age) Does the patient have difficulty walking or climbing stairs?: No Weakness of Legs: None Weakness of Arms/Hands: None  Home Assistive Devices/Equipment Home Assistive Devices/Equipment: None    Abuse/Neglect Assessment (Assessment to be complete while patient is alone) Abuse/Neglect Assessment Can Be Completed: Yes Physical Abuse: Denies Verbal Abuse: Denies Sexual Abuse: Denies Exploitation of patient/patient's resources: Denies Self-Neglect: Denies     Merchant navy officer (For Healthcare) Does Patient Have a Medical Advance Directive?: No Would patient like information on creating a medical advance directive?: No - Patient declined          Disposition: Gave clinical report to Nira Conn, FNP who completed MSE and determined Pt does not meet criteria for inpatient psychiatric treatment. Pt agrees to contact [facility] for IOP. Pt was given information on IOP program, 24-hour crisis numbers,  and contact number for Mobile Crisis. Pt will be staying with her parents for the next  few days. Pt's mother agrees with recommendation and expresses no concerns for Pt's immediate safety.  Disposition Initial Assessment Completed for this Encounter: Yes Disposition of Patient: Discharge Patient refused recommended treatment: No Mode of transportation if patient is discharged/movement?: Car Patient referred to: Outpatient clinic referral  On Site Evaluation by:  Nira Conn, FNP Reviewed with Physician:    Pamalee Leyden, [facility], [facility] Triage Specialist [phone]  Patsy Baltimore, Harlin Rain 05/20/2019 11:24 PM

## 2019-05-20 NOTE — H&P (Signed)
Behavioral Health Medical Screening Exam  Tabitha Hawkins is an 23 y.o. female.  Total Time spent with patient: 15 minutes  Psychiatric Specialty Exam: Physical Exam  Constitutional: She is oriented to person, place, and time. She appears well-developed and well-nourished. No distress.  HENT:  Head: Normocephalic.  Eyes: Right eye exhibits no discharge. Left eye exhibits no discharge.  Respiratory: Effort normal. No respiratory distress.  Musculoskeletal: Normal range of motion.  Neurological: She is alert and oriented to person, place, and time.  Skin: She is not diaphoretic.  Psychiatric: Her mood appears anxious. Thought content is not paranoid and not delusional. She exhibits a depressed mood. She expresses no homicidal and no suicidal ideation.    Review of Systems  Constitutional: Negative for chills, diaphoresis, fever, malaise/fatigue and weight loss.  Respiratory: Negative for cough and shortness of breath.   Cardiovascular: Negative for chest pain.  Gastrointestinal: Negative for diarrhea, nausea and vomiting.  Psychiatric/Behavioral: Positive for depression, substance abuse and suicidal ideas. Negative for memory loss. The patient is nervous/anxious and has insomnia.     There were no vitals taken for this visit.There is no height or weight on file to calculate BMI.  General Appearance: Casual  Eye Contact:  Good  Speech:  Clear and Coherent and Normal Rate  Volume:  Normal  Mood:  Euthymic  Affect:  Congruent  Thought Process:  Coherent, Goal Directed, Linear and Descriptions of Associations: Intact  Orientation:  Full (Time, Place, and Person)  Thought Content:  Logical  Suicidal Thoughts:  No  Homicidal Thoughts:  No  Memory:  Immediate;   Good Recent;   Good  Judgement:  Fair  Insight:  Fair  Psychomotor Activity:  Normal  Concentration: Concentration: Good  Recall:  Good  Fund of Knowledge:Good  Language: Good  Akathisia:  Negative  Handed:  Right  AIMS  (if indicated):     Assets:  Communication Skills Desire for Improvement Financial Resources/Insurance Housing Leisure Time Physical Health  Sleep:       Musculoskeletal: Strength & Muscle Tone: within normal limits Gait & Station: normal Patient leans: N/A  There were no vitals taken for this visit.  Recommendations:  Based on my evaluation the patient does not appear to have an emergency medical condition. Referred to IOP Disposition: No evidence of imminent risk to self or others at present.   Patient does not meet criteria for psychiatric inpatient admission. Discussed crisis plan, support from social network, calling 911, coming to the Emergency Department, and calling Suicide Hotline.  [name], NP 05/20/2019, 11:59 PM

## 2019-05-21 ENCOUNTER — Telehealth (HOSPITAL_COMMUNITY): Payer: Self-pay | Admitting: Psychiatry

## 2019-05-21 NOTE — Telephone Encounter (Signed)
D:  Pt had left vm inquiring about MH-IOP.  Apparently, pt was assessed yesterday at TTS.  A:  Placed call to orient and discuss which group would be more appropriate for patient, but there was no answer.  Left vm for patient to call writer back.  Will discuss with PHP and CD-IOP once writer gets some clarification on some details.

## 2019-05-21 NOTE — Telephone Encounter (Signed)
D:  Pt returned case mgr's call.  A:  Due to increased ETOH use, frequency, and duration; pt will be referred to CD-IOP.  First pt will go to [facility] for detox then transition to CD-IOP.  Pt states she wants to stop drinking. Consulted with Micheline Chapman, LCAS.  R:  Pt receptive.

## 2019-05-22 ENCOUNTER — Telehealth (HOSPITAL_COMMUNITY): Payer: Self-pay | Admitting: Licensed Clinical Social Worker

## 2019-05-22 NOTE — Telephone Encounter (Signed)
Will discuss with provider appropriate start date.

## 2019-05-24 ENCOUNTER — Telehealth (HOSPITAL_COMMUNITY): Payer: Self-pay | Admitting: Licensed Clinical Social Worker

## 2019-05-24 NOTE — Telephone Encounter (Signed)
Spoke with client on 05/23/19 initially to discuss treatment options. Staffed with clinical team and recommendation is detox prior to [place]. Client stated previously she would not be able to attend an inpatient program at this time due to needing to be at rehearsal dinner Friday and sister's wedding this weekend however would consider after. Clinician unable to leave voicemail confirming recommendation for detox due to VM being full.

## 2019-05-29 DIAGNOSIS — B9620 Unspecified Escherichia coli [E. coli] as the cause of diseases classified elsewhere: Secondary | ICD-10-CM | POA: Diagnosis not present

## 2019-05-29 DIAGNOSIS — F1290 Cannabis use, unspecified, uncomplicated: Secondary | ICD-10-CM | POA: Diagnosis not present

## 2019-05-29 DIAGNOSIS — Z3202 Encounter for pregnancy test, result negative: Secondary | ICD-10-CM | POA: Diagnosis not present

## 2019-05-29 DIAGNOSIS — Z7289 Other problems related to lifestyle: Secondary | ICD-10-CM | POA: Diagnosis not present

## 2019-05-29 DIAGNOSIS — N390 Urinary tract infection, site not specified: Secondary | ICD-10-CM | POA: Diagnosis not present

## 2019-05-29 DIAGNOSIS — Z008 Encounter for other general examination: Secondary | ICD-10-CM | POA: Diagnosis not present

## 2019-05-29 DIAGNOSIS — F329 Major depressive disorder, single episode, unspecified: Secondary | ICD-10-CM | POA: Diagnosis not present

## 2019-06-01 ENCOUNTER — Ambulatory Visit (INDEPENDENT_AMBULATORY_CARE_PROVIDER_SITE_OTHER): Payer: BC Managed Care – PPO | Admitting: Licensed Clinical Social Worker

## 2019-06-01 ENCOUNTER — Other Ambulatory Visit: Payer: Self-pay

## 2019-06-01 DIAGNOSIS — F988 Other specified behavioral and emotional disorders with onset usually occurring in childhood and adolescence: Secondary | ICD-10-CM | POA: Diagnosis not present

## 2019-06-01 DIAGNOSIS — F1010 Alcohol abuse, uncomplicated: Secondary | ICD-10-CM | POA: Diagnosis not present

## 2019-06-01 DIAGNOSIS — F1210 Cannabis abuse, uncomplicated: Secondary | ICD-10-CM | POA: Diagnosis not present

## 2019-06-01 DIAGNOSIS — F419 Anxiety disorder, unspecified: Secondary | ICD-10-CM | POA: Diagnosis not present

## 2019-06-01 DIAGNOSIS — F32A Depression, unspecified: Secondary | ICD-10-CM

## 2019-06-01 DIAGNOSIS — F329 Major depressive disorder, single episode, unspecified: Secondary | ICD-10-CM

## 2019-06-01 NOTE — Progress Notes (Signed)
Comprehensive Clinical Assessment (CCA) Note  06/01/2019 Tabitha Hawkins [id number]  Visit Diagnosis:      ICD-10-CM   1. Alcohol abuse  F10.10   2. Marijuana abuse  F12.10   3. Anxiety and depression  F41.9    F32.9   4. Attention deficit disorder (ADD) without hyperactivity  F98.8       CCA Part One  Part One has been completed on paper by the patient.  (See scanned document in Chart Review)  CCA Part Two A  Intake/Chief Complaint:  CCA Intake With Chief Complaint CCA Part Two Date: 06/01/19 Chief Complaint/Presenting Problem: Address Alcohol use in relation to mental health concerns Patients Currently Reported Symptoms/Problems: alcohol use problematic, recent depression Collateral Involvement: EHR Individual's Strengths: working mostly [employer]: work on [employer] and [employer] Individual's Abilities: good at job, working all the time or watching TV Type of Services Patient Feels Are Needed: therapy Initial Clinical Notes/Concerns: See Below   lient is a 23 year old caucasian female presenting as a referral for CDIOP. Client initially referred for substance abuse treatment by PCP. Client was hospitalized in July 2020 for SI with attempt by cutting wrist. Client states she was overwhelmed with lack of work, finances, relationships.' Client was then linked with therapist at [facility] where she did not attend long, and psychiatrist Dr Cottie Banda at [facility]. Client reports worsening depression 2 weeks ago and requested mother take her to [facility] where she was not admitted and provided list of resources. Client contacted [facility] and was directed to detox due to report of up to 18 beers daily. Client was assessed at [facility] and told se did not meet criteria for need for detox due to last alcohol use reported on 05/20/2019. Prior to this client was drinking 10-12 beers daily and continues to smoke cannabis daily.  Client was raised in  [place] by adoptive parents. Client reports being adopted at 32 months old. Client states she does not know anything about her father and only knows her mother had addiction to drugs/alcohol and died of overdose. Client is the youngest with 2 older brothers with whom she gets along, and one older sister whose relationship is strained. Client reports supportive relationship with parents whom she sees 2-3 times weekly. Client denies history of any trauma. Client currently lives with one roommate of 1.5 years, 'my best friend' who is supportive. Client has been working at [employer] for 3 years up to current however was not working for a while due to [employer], which did cause additional strain. In 2018 client had second job where she me 'the wrong people' and took 3-4 xanax bars daily around less than one year. Client denies any legal involvement.   Client endorses previous diagnosis of Anxiety, Depression, and ADHD, all of which client reports is helped by marijuana. Client is compliant with medications including Prozac 40mg  daily and Ambien 6.25 mg as needed, client reports not needing this often and has been overall sleeping well, sometimes too much. Client denies current SI/HI/psychosis, most recent 1-2 weeks ago with no plan or intent. Client reports history of 7 concussions resulting in doctor saying she could no longer play volleyball after 1 semester of university. Client notes this likely was a stressor for depression and increased drinking around age 74. 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 Client stopped school in 2017. Client reports this was also a stressor for increased substance use.  Stressors of Covid increased drinking again in summer 2020, worsening depression.    Mental Health Symptoms Depression:  Depression: Change in energy/activity, Fatigue(1.5 wk- fatigue/sleeping a lot despite no work)  Mania:  Mania: N/A  Anxiety:      Psychosis:  Psychosis:  N/A(Denies)  Trauma:  Trauma: N/A(denies any trauma)  Obsessions:  Obsessions: N/A  Compulsions:  Compulsions: N/A  Inattention:  Inattention: Fails to pay attention/makes careless mistakes, Forgetful, Loses things  Hyperactivity/Impulsivity:  Hyperactivity/Impulsivity: Blurts out answers, Always on the go, Feeling of restlessness, Fidgets with hands/feet, Several symptoms present in 2 of more settings(dx in 5th or 6th grade)  Oppositional/Defiant Behaviors:  Oppositional/Defiant Behaviors: Agression toward people/animals, Easily annoyed, Argumentative, Defies rules  Borderline Personality:  Emotional Irregularity: Mood lability, Potentially harmful impulsivity, Recurrent suicidal behaviors/gestures/threats(mood changes ou of nowhere; going on for 'a while'; passive SI every other day; no action since summer 2020 wrist cutting)  Other Mood/Personality Symptoms:      Mental Status Exam Appearance and self-care  Stature:  Stature: Average  Weight:  Weight: Average weight  Clothing:  Clothing: Careless/inappropriate  Grooming:  Grooming: Normal  Cosmetic use:  Cosmetic Use: Age appropriate  Posture/gait:     Motor activity:  Motor Activity: Not Remarkable  Sensorium  Attention:  Attention: Normal  Concentration:  Concentration: Scattered(terrible, awful; walk away in conversations on accident)  Orientation:  Orientation: X5  Recall/memory:  Recall/Memory: Normal  Affect and Mood  Affect:  Affect: Appropriate  Mood:  Mood: Euthymic  Relating  Eye contact:  Eye Contact: Normal  Facial expression:  Facial Expression: Responsive  Attitude toward examiner:  Attitude Toward Examiner: Cooperative  Thought and Language  Speech flow: Speech Flow: Normal  Thought content:  Thought Content: Appropriate to mood and circumstances  Preoccupation:  Preoccupations: Suicide(no)  Hallucinations:  Hallucinations: Other (Comment)(none)  Organization:     Company secretary of Knowledge:  Fund  of Knowledge: Average  Intelligence:  Intelligence: Average  Abstraction:  Abstraction: Normal  Judgement:  Judgement: Fair  Dance movement psychotherapist:  Reality Testing: Realistic  Insight:  Insight: Fair  Decision Making:  Decision Making: Impulsive  Social Functioning  Social Maturity:  Social Maturity: Impulsive  Social Judgement:  Social Judgement: Normal  Stress  Stressors:  Stressors: Transitions, Work  Coping Ability:  Coping Ability: Resilient, Normal  Skill Deficits:     Supports:      Family and Psychosocial History: Family history Marital status: Single Are you sexually active?: Yes What is your sexual orientation?: heterosexual Has your sexual activity been affected by drugs, alcohol, medication, or emotional stress?: no Does patient have children?: No  Childhood History:  Childhood History By whom was/is the patient raised?: Adoptive parents(mom and dad; 11 months) Additional childhood history information: adopted at 11months old Description of patient's relationship with caregiver when they were a child: 'it was awesome' 'literally nothing bad to say about them' Patient's description of current relationship with people who raised him/her: supportive How were you disciplined when you got in trouble as a child/adolescent?: my mom said she was disappointed in me and i would cry Does patient have siblings?: Yes Number of Siblings: 2 Description of patient's current relationship with siblings: youngest of 2 brothers 1 sister; get along well with brother; 'me and my sister are very different' 'tension she doesn't know about b/c the world revolves around her...i put her in her place and she blew up' Did patient suffer any verbal/emotional/physical/sexual abuse as a child?: No Did patient suffer  from severe childhood neglect?: No Has patient ever been sexually abused/assaulted/raped as an adolescent or adult?: No Was the patient ever a victim of a crime or a disaster?: No Witnessed  domestic violence?: No Has patient been effected by domestic violence as an adult?: No  CCA Part Two B  Employment/Work Situation: Employment / Work Psychologist, occupationalituation Employment situation: Employed Where is patient currently employed?: [employer] How long has patient been employed?: 3 years Patient's job has been impacted by current illness: No What is the longest time patient has a held a job?: current Did You Receive Any Psychiatric Treatment/Services While in [employer]ities traderthe Military?: No Are There Guns or Other Weapons in Your Home?: No  Education: Engineer, civil (consulting)ducation School Currently Attending: none Last Grade Completed: 12 Did Garment/textile technologistYou Graduate From [employer] School?: Yes Did Theme park managerYou Attend College?: No Did You Attend Graduate School?: No Did You Have Any Difficulty At [employer] EnergySchool?: Yes  Religion: Religion/Spirituality Are You A Religious Person?: Yes What is Your Religious Affiliation?: Environmental consultantBaptist  Leisure/Recreation: Leisure / Recreation Leisure and Hobbies: watching tv  Exercise/Diet: Exercise/Diet Do You Exercise?: No Have You Gained or Lost A Significant Amount of Weight in the Past Six Months?: No Do You Follow a Special Diet?: No Do You Have Any Trouble Sleeping?: No  CCA Part Two C  Alcohol/Drug Use: Alcohol / Drug Use Pain Medications: denies Prescriptions: see MAR Over the Counter: none History of alcohol / drug use?: Yes Longest period of sobriety (when/how long): 6 months (2019 around the time of foot surgery) Negative Consequences of Use: Personal relationships Withdrawal Symptoms: Fever / Chills, Irritability, Sweats, Nausea / Vomiting Substance #1 Name of Substance 1: alcohol 1 - Age of First Use: 17 1 - Amount (size/oz): 10-12 beers in 11/20; 16-18 beers 09/2018 1 - Frequency: daily 1 - Duration: 4 years total, 2 years 'problematic' 1 - Last Use / Amount: 05/20/19 Substance #2 Name of Substance 2: marijuana 2 - Age of First Use: 17 2 - Amount (size/oz): 1 blunt of 'couple bong hits' 2 -  Frequency: 2-3x daily 2 - Duration: years 2 - Last Use / Amount: 05/31/19 Substance #3 Name of Substance 3: cocaine 3 - Age of First Use: 23 3 - Amount (size/oz): 2-3 lines 3 - Frequency: every other month 3 - Duration: less than one year 3 - Last Use / Amount: 'before quarentine'    Xanax (non prescribed) 3-4 bars daily in 2018 for less than one year    CCA Part Three  ASAM's:  Six Dimensions of Multidimensional Assessment  Dimension 1:  Acute Intoxication and/or Withdrawal Potential:  Dimension 1:  Comments[facility] after 9 days not drinking  Dimension 2:  Biomedical Conditions and Complications:  Dimension 2:  Comments: no reported physical health conditions  Dimension 3:  Emotional, Behavioral, or Cognitive Conditions and Complications:  Dimension 3:  Comments: client reports using alcohol and marijuana to cope with anxiety, depression, and motivation  Dimension 4:  Readiness to Change:  Dimension 4:  Comments: actively open to stopping alcohol; willing to stop smoking marijuana for durration of program but initially no intention of stopping smoking  Dimension 5:  Relapse, Continued use, or Continued Problem Potential:  Dimension 5:  Comments: ongoing daily use for 2 years  Dimension 6:  Recovery/Living Environment:  Dimension 6:  Recovery/Living Environment Comments: family and roomate supportive; work willing to adjust schedule for group attendance   Substance use Disorder (SUD) Substance Use Disorder (SUD)  Checklist Symptoms  of Substance Use: Continued use despite having a persistent/recurrent physical/psychological problem caused/exacerbated by use, Continued use despite persistent or recurrent social, interpersonal problems, caused or exacerbated by use, Evidence of tolerance, Evidence of withdrawal (Comment), Large amounts of time spent to obtain, use or recover from the substance(s), Persistent desire or unsuccessful efforts to cut down or  control use, Presence of craving or strong urge to use, Recurrent use that results in a fialure to fulfill major rule obligatinos (work, school, home), Repeated use in physically hazardous situations, Social, occupational, recreational activities given up or reduced due to use, Substance(s) often taken in large amounts or over longer times than was intended  Social Function:  Social Functioning Social Maturity: Impulsive Social Judgement: Normal  Stress:  Stress Stressors: Transitions, Work Coping Ability: Resilient, Normal Patient Takes Medications The Way The Doctor Instructed?: Yes Priority Risk: Low Acuity  Risk Assessment- Self-Harm Potential: Risk Assessment For Self-Harm Potential Thoughts of Self-Harm: No current thoughts Method: No plan Availability of Means: No access/NA Additional Information for Self-Harm Potential: Acts of Self-harm, Previous Attempts Additional Comments for Self-Harm Potential: in [facility] 12/2018 for SI with wrist cutting; last SI within 1-2 weeks with no plan and actively seeking help from supports  Risk Assessment -Dangerous to Others Potential: Risk Assessment For Dangerous to Others Potential Method: No Plan  DSM5 Diagnoses: Patient Active Problem List   Diagnosis Date Noted  . Major depressive disorder, recurrent episode, in full remission ([place]) 12/21/2018  . Asthma 02/09/2017  . Anxiety and depression 07/30/2016  . PCP NOTES >>>>> 06/02/2015  . Allergic rhinitis 04/14/2012  . Annual physical exam 12/27/2011  .  ADD (attention deficit disorder) 12/27/2011    Patient Centered Plan: Patient is on the following Treatment Plan(s):  Depression and Impulse Control; substance abuse  Recommendations for Services/Supports/Treatments: Recommendations for Services/Supports/Treatments Recommendations For Services/Supports/Treatments: CD-IOP Intensive Chemical Dependency Program(Discussed with client options for OPT and CDIOP. Client in agreement for  additional support to address both mental health and substance use concerns.)  Treatment Plan Summary:  Client meets criteria for Alcohol use disorder, cannabis use disorder, major depressive disorder. Recommendation to engage in [place] De[place] to address substance use and effect of substances on mental health.  Tx goal include: achieve and maintain sobriety from all substances 7/7 days per week. Increase use of healthy coping skills to manage mood at least 1 time daily at least 4 days per week. Take all medications as prescribed 7/7 days per week. Client is in agreement with treatment goals. Client was notified to contact insurance to verify benefits and client is in agreement with this.  Referrals to Alternative Service(s): Referred to Alternative Service(s):   Place:   Date:   Time:    Referred to Alternative Service(s):   Place:   Date:   Time:    Referred to Alternative Service(s):   Place:   Date:   Time:    Referred to Alternative Service(s):   Place:   Date:   Time:     Olegario Messier, LCSW, LCAS

## 2019-06-04 ENCOUNTER — Ambulatory Visit (HOSPITAL_COMMUNITY): Payer: BC Managed Care – PPO | Admitting: Licensed Clinical Social Worker

## 2019-06-04 ENCOUNTER — Encounter (HOSPITAL_COMMUNITY): Payer: Self-pay | Admitting: Medical

## 2019-06-04 ENCOUNTER — Other Ambulatory Visit: Payer: Self-pay

## 2019-06-04 ENCOUNTER — Other Ambulatory Visit (HOSPITAL_COMMUNITY): Payer: BC Managed Care – PPO | Attending: Medical | Admitting: Medical

## 2019-06-04 VITALS — BP 117/72 | HR 78 | Ht 69.0 in | Wt 171.1 lb

## 2019-06-04 DIAGNOSIS — Z915 Personal history of self-harm: Secondary | ICD-10-CM | POA: Insufficient documentation

## 2019-06-04 DIAGNOSIS — Z0282 Encounter for adoption services: Secondary | ICD-10-CM

## 2019-06-04 DIAGNOSIS — F3342 Major depressive disorder, recurrent, in full remission: Secondary | ICD-10-CM | POA: Diagnosis not present

## 2019-06-04 DIAGNOSIS — R519 Headache, unspecified: Secondary | ICD-10-CM | POA: Insufficient documentation

## 2019-06-04 DIAGNOSIS — F419 Anxiety disorder, unspecified: Secondary | ICD-10-CM

## 2019-06-04 DIAGNOSIS — Z813 Family history of other psychoactive substance abuse and dependence: Secondary | ICD-10-CM | POA: Diagnosis not present

## 2019-06-04 DIAGNOSIS — G44001 Cluster headache syndrome, unspecified, intractable: Secondary | ICD-10-CM

## 2019-06-04 DIAGNOSIS — G479 Sleep disorder, unspecified: Secondary | ICD-10-CM

## 2019-06-04 DIAGNOSIS — F1020 Alcohol dependence, uncomplicated: Secondary | ICD-10-CM

## 2019-06-04 DIAGNOSIS — Z87820 Personal history of traumatic brain injury: Secondary | ICD-10-CM

## 2019-06-04 DIAGNOSIS — F810 Specific reading disorder: Secondary | ICD-10-CM

## 2019-06-04 DIAGNOSIS — F900 Attention-deficit hyperactivity disorder, predominantly inattentive type: Secondary | ICD-10-CM

## 2019-06-04 DIAGNOSIS — F329 Major depressive disorder, single episode, unspecified: Secondary | ICD-10-CM

## 2019-06-04 DIAGNOSIS — F1220 Cannabis dependence, uncomplicated: Secondary | ICD-10-CM | POA: Diagnosis not present

## 2019-06-04 DIAGNOSIS — F32A Depression, unspecified: Secondary | ICD-10-CM

## 2019-06-04 DIAGNOSIS — R413 Other amnesia: Secondary | ICD-10-CM

## 2019-06-04 MED ORDER — TRAZODONE HCL 50 MG PO TABS: 50.0000 mg | ORAL_TABLET | Freq: Every evening | ORAL | 2 refills | Status: DC | PRN
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
## 2019-06-05 ENCOUNTER — Telehealth (HOSPITAL_COMMUNITY): Payer: Self-pay | Admitting: Licensed Clinical Social Worker

## 2019-06-06 ENCOUNTER — Ambulatory Visit (HOSPITAL_BASED_OUTPATIENT_CLINIC_OR_DEPARTMENT_OTHER): Payer: BC Managed Care – PPO | Admitting: Medical

## 2019-06-06 ENCOUNTER — Encounter (HOSPITAL_COMMUNITY): Payer: Self-pay | Admitting: Medical

## 2019-06-06 DIAGNOSIS — G44001 Cluster headache syndrome, unspecified, intractable: Secondary | ICD-10-CM | POA: Diagnosis not present

## 2019-06-06 DIAGNOSIS — F810 Specific reading disorder: Secondary | ICD-10-CM

## 2019-06-06 DIAGNOSIS — F10288 Alcohol dependence with other alcohol-induced disorder: Secondary | ICD-10-CM

## 2019-06-06 DIAGNOSIS — Z87820 Personal history of traumatic brain injury: Secondary | ICD-10-CM

## 2019-06-06 DIAGNOSIS — F419 Anxiety disorder, unspecified: Secondary | ICD-10-CM | POA: Diagnosis not present

## 2019-06-06 DIAGNOSIS — F329 Major depressive disorder, single episode, unspecified: Secondary | ICD-10-CM | POA: Diagnosis not present

## 2019-06-06 DIAGNOSIS — F1020 Alcohol dependence, uncomplicated: Secondary | ICD-10-CM | POA: Diagnosis not present

## 2019-06-06 DIAGNOSIS — F12288 Cannabis dependence with other cannabis-induced disorder: Secondary | ICD-10-CM

## 2019-06-06 DIAGNOSIS — Z9119 Patient's noncompliance with other medical treatment and regimen: Secondary | ICD-10-CM

## 2019-06-06 DIAGNOSIS — R413 Other amnesia: Secondary | ICD-10-CM

## 2019-06-06 DIAGNOSIS — Z813 Family history of other psychoactive substance abuse and dependence: Secondary | ICD-10-CM

## 2019-06-06 DIAGNOSIS — G479 Sleep disorder, unspecified: Secondary | ICD-10-CM

## 2019-06-06 DIAGNOSIS — F900 Attention-deficit hyperactivity disorder, predominantly inattentive type: Secondary | ICD-10-CM
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
## 2019-06-07 ENCOUNTER — Encounter (HOSPITAL_COMMUNITY): Payer: BC Managed Care – PPO

## 2019-06-07 ENCOUNTER — Telehealth (HOSPITAL_COMMUNITY): Payer: Self-pay | Admitting: Licensed Clinical Social Worker

## 2019-06-11 ENCOUNTER — Encounter (HOSPITAL_COMMUNITY): Payer: BC Managed Care – PPO

## 2019-06-13 ENCOUNTER — Encounter (HOSPITAL_COMMUNITY): Payer: BC Managed Care – PPO

## 2019-06-18 ENCOUNTER — Encounter (HOSPITAL_COMMUNITY): Payer: BC Managed Care – PPO

## 2019-06-20 ENCOUNTER — Encounter (HOSPITAL_COMMUNITY): Payer: BC Managed Care – PPO

## 2019-06-21 ENCOUNTER — Encounter (HOSPITAL_COMMUNITY): Payer: BC Managed Care – PPO

## 2019-06-25 ENCOUNTER — Encounter (HOSPITAL_COMMUNITY): Payer: BC Managed Care – PPO

## 2019-06-27 ENCOUNTER — Encounter (HOSPITAL_COMMUNITY): Payer: BC Managed Care – PPO

## 2019-06-28 ENCOUNTER — Encounter (HOSPITAL_COMMUNITY): Payer: BC Managed Care – PPO

## 2019-07-02 ENCOUNTER — Encounter (HOSPITAL_COMMUNITY): Payer: BC Managed Care – PPO

## 2019-07-04 ENCOUNTER — Encounter (HOSPITAL_COMMUNITY): Payer: BC Managed Care – PPO

## 2019-07-05 ENCOUNTER — Encounter (HOSPITAL_COMMUNITY): Payer: BC Managed Care – PPO

## 2019-07-06 ENCOUNTER — Other Ambulatory Visit: Payer: Self-pay

## 2019-07-06 ENCOUNTER — Ambulatory Visit (INDEPENDENT_AMBULATORY_CARE_PROVIDER_SITE_OTHER): Payer: BC Managed Care – PPO | Admitting: Psychiatry

## 2019-07-06 DIAGNOSIS — F1021 Alcohol dependence, in remission: Secondary | ICD-10-CM

## 2019-07-06 DIAGNOSIS — F330 Major depressive disorder, recurrent, mild: Secondary | ICD-10-CM | POA: Diagnosis not present

## 2019-07-06 DIAGNOSIS — F909 Attention-deficit hyperactivity disorder, unspecified type: Secondary | ICD-10-CM

## 2019-07-06 DIAGNOSIS — F1011 Alcohol abuse, in remission: Secondary | ICD-10-CM | POA: Diagnosis not present

## 2019-07-06 DIAGNOSIS — F411 Generalized anxiety disorder: Secondary | ICD-10-CM

## 2019-07-06 DIAGNOSIS — F1290 Cannabis use, unspecified, uncomplicated: Secondary | ICD-10-CM | POA: Diagnosis not present

## 2019-07-06 DIAGNOSIS — F1210 Cannabis abuse, uncomplicated: Secondary | ICD-10-CM | POA: Insufficient documentation

## 2019-07-06 HISTORY — DX: Generalized anxiety disorder: F41.1

## 2019-07-06 HISTORY — DX: Cannabis abuse, uncomplicated: F12.10

## 2019-07-06 HISTORY — DX: Alcohol dependence, in remission: F10.21

## 2019-07-06 MED ORDER — ARIPIPRAZOLE 5 MG PO TABS: 5.0000 mg | ORAL_TABLET | Freq: Every day | ORAL | 0 refills | Status: DC

## 2019-07-06 NOTE — Progress Notes (Signed)
BH MD/PA/NP OP Progress Note  07/06/2019 8:12 AM Tabitha Hawkins  MRN:  [id number] Interview was conducted by phone and I verified that I was speaking with the correct person using two identifiers. I discussed the limitations of evaluation and management by telemedicine and  the availability of in person appointments. Patient expressed understanding and agreed to proceed.  Chief Complaint: Anxiety, some depression.  HPI: 24 yo single white famale with hx of depression and past dx of ADD whose mood recently declined to the point that she started to feel suicidal and cut her right wrist. She was admitted to [facility] on 12/21/18 and stayed for 5 days. She associates onset of depression with her having to leave college (was at [employer]) and end of her volleyball career due to recurrent concussions. She admits to having previous SI but never attempted suicide before. She was prescribed fluoxetine 20 mg by her PCP. Dose of fluoxetine was doubled during this hospitalization and her mood has quickly improved. She was in remission of depressive symptoms, denied having excessive worrying and had not used hydroxyzine since discharge from [facility].She reported much improved sleep and did not need to take Ambien except on rare occasion. Shedeniedsmokingcannabis when we had a session in early October but now admits that she did not quit using it (has been smoking cannabis since age 59). In mid November her anxiety and depression increased (no clear trigger) and she started to drink alcohol excessively - up to 12 pack of beer daily. Her cannabis use also increased to several per day. She eventually came to [facility] as a walk in but was not admitted and instead referred to CD-IOP. She participated in 3 sessions then stoppe and has started individual therapy which she has on weekly basis. She has not drank alcohol for a month now and use of marijuana decreased to 2-3 x per week. She is still anxious but less depressed and  not hopeless or suicidal. Tabitha Hawkins admits to brief periods of increased energy, irritability - she has chronic problems with episodic insomnia. Ambien was changed to trazodone which works well and again she does not need it daily. 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  Visit Diagnosis:    ICD-10-CM   1. GAD (generalized anxiety disorder)  F41.1   2. Major depressive disorder, recurrent episode, mild (HCC)  F33.0   3. Cannabis use disorder, mild, abuse  F12.10   4. Alcohol use disorder, moderate, in early remission (HCC)  F10.21     Past Psychiatric History: Plese see intake H&P.  Past Medical History:  Past Medical History:  Diagnosis Date  . ADHD (attention deficit hyperactivity disorder)   . H/O multiple concussions    seen by neuro  . Headache    frequent  . Short-term memory loss    cause from multiple concussions?    Past Surgical History:  Procedure Laterality Date  . BUNIONECTOMY    . KNEE ARTHROSCOPY Left 2016    Family Psychiatric History: Reviewed.  Family History:  Family History  Adopted: Yes  Problem Relation Age of Onset  . Drug abuse Mother     Social History:  Social History   Socioeconomic History  . Marital status: Single    Spouse name: Not on file  .  Number of children: 0  . Years of education: Not on file  . Highest education level: Not on file  Occupational History  . Occupation: waitress   Tobacco Use  . Smoking status: Never Smoker  . Smokeless tobacco: Never Used  Substance and Sexual Activity  . Alcohol use: Yes    Comment: occ  . Drug use: Yes    Types: Marijuana  . Sexual activity: Yes    Birth control/protection: Implant  Other Topics Concern  . Not on file  Social History Narrative   Household-- father and mother    Stopped college  [employer], [place] d/t a concussion, couldn't play volley-ball 05-2015       Social Determinants of Health   Financial Resource Strain:   . Difficulty of Paying Living Expenses: Not on file  Food Insecurity:   . Worried About Programme researcher, broadcasting/film/video in the Last Year: Not on file  . Ran Out of Food in the Last Year: Not on file  Transportation Needs:   . Lack of Transportation (Medical): Not on file  . Lack of Transportation (Non-Medical): Not on file  Physical Activity:   . Days of Exercise per Week: Not on file  . Minutes of Exercise per Session: Not on file  Stress:   . Feeling of Stress : Not on file  Social Connections:   . Frequency of Communication with Friends and Family: Not on file  . Frequency of Social Gatherings with Friends and Family: Not on file  . Attends Religious Services: Not on file  . Active Member of Clubs or Organizations: Not on file  . Attends Banker Meetings: Not on file  . Marital Status: Not on file    Allergies: No Known Allergies  Metabolic Disorder Labs: Lab Results  Component Value Date   HGBA1C 4.3 (L) 12/22/2018   MPG 76.71 12/22/2018   No results found for: PROLACTIN Lab Results  Component Value Date   CHOL 207 (H) 12/22/2018   TRIG 89 12/22/2018   HDL 106 12/22/2018   CHOLHDL 2.0 12/22/2018   VLDL 18 12/22/2018   LDLCALC 83 12/22/2018   Lab Results  Component Value Date   TSH 3.452 12/22/2018   TSH 0.79 07/29/2016    Therapeutic Level Labs: No results found for: LITHIUM No results found for: VALPROATE No components found for:  CBMZ  Current Medications: Current Outpatient Medications  Medication Sig Dispense Refill  . albuterol (VENTOLIN HFA) 108 (90 Base) MCG/ACT inhaler Inhale 2 puffs into the lungs every 6 (six) hours as needed for wheezing or shortness of breath. 1 Inhaler 1  . ARIPiprazole (ABILIFY) 5 MG tablet Take 1 tablet (5 mg total) by mouth daily. 30 tablet 0  . cephALEXin (KEFLEX) 500 MG capsule  Take 500 mg by mouth 4 (four) times daily.    Marland Kitchen FLUoxetine (PROZAC) 40 MG capsule Take 1 capsule (40 mg total) by mouth daily. 90 capsule 0  . Levonorgestrel (KYLEENA) 19.5 MG IUD Kyleena 17.5 mcg/24 hrs (60yrs) 19.5mg  intrauterine device  Take 1 device by intrauterine route.    . traZODone (DESYREL) 50 MG tablet Take 1 tablet (50 mg total) by mouth at bedtime as needed and may repeat dose one time if needed for sleep. 60 tablet 2   No current facility-administered medications for this visit.     Psychiatric Specialty Exam: Review of Systems  Psychiatric/Behavioral: The patient is nervous/anxious.   All other systems reviewed and are negative.   There were  no vitals taken for this visit.There is no height or weight on file to calculate BMI.  General Appearance: NA  Eye Contact:  NA  Speech:  Clear and Coherent and Normal Rate  Volume:  Normal  Mood:  More anxious than depressed.  Affect:  NA  Thought Process:  Goal Directed and Linear  Orientation:  Full (Time, Place, and Person)  Thought Content: Logical   Suicidal Thoughts:  No  Homicidal Thoughts:  No  Memory:  Immediate;   Good Recent;   Good Remote;   Good  Judgement:  Fair  Insight:  Fair  Psychomotor Activity:  NA  Concentration:  Concentration: Fair  Recall:  Good  Fund of Knowledge: Good  Language: Good  Akathisia:  Negative  Handed:  Right  AIMS (if indicated): not done  Assets:  Communication Skills Desire for Improvement Financial Resources/Insurance [place] Talents/Skills  ADL's:  Intact  Cognition: WNL  Sleep:  Fair   Screenings: AIMS     Admission (Discharged) from 12/21/2018 in [facility]  AIMS Total Score  0    AUDIT     Counselor from 06/04/2019 in [place] Admission (Discharged) from 12/21/2018 in [place] [facility]  Alcohol Use Disorder Identification Test Final  Score (AUDIT)  24  0    GAD-7     Counselor from 06/04/2019 in [place]  Total GAD-7 Score  6    PHQ2-9     Counselor from 06/04/2019 in [place] [facility] Visit from 05/02/2019 in [employer] [place] Visit from 05/11/2018 in [place] at [facility]  PHQ-2 Total Score  2  2  0  PHQ-9 Total Score  10  9  0       Assessment and Plan: 24 yo single white famale with hx of depression and past dx of ADD whose mood recently declined to the point that she started to feel suicidal and cut her right wrist. She was admitted to [facility] on 12/21/18 and stayed for 5 days. She associates onset of depression with her having to leave college (was at [employer]) and end of her volleyball career due to recurrent concussions. She admits to having previous SI but never attempted suicide before. She was prescribed fluoxetine 20 mg by her PCP. Dose of fluoxetine was doubled during this hospitalization and her mood has quickly improved. She was in remission of depressive symptoms, denied having excessive worrying and had not used hydroxyzine since discharge from [facility].She reported much improved sleep and did not need to take Ambien except on rare occasion. Shedeniedsmokingcannabis when we had a session in early October but now admits that she did not quit using it (has been smoking cannabis since age 18). In mid November her anxiety and depression increased (no clear trigger) and she started to drink alcohol excessively - up to 12 pack of beer daily. Her cannabis use also increased to several per day. She eventually came to [facility] as a walk in but was not admitted and instead referred to CD-IOP. She participated in 3 sessions then stoppe and has started individual therapy which she has on weekly basis. She has not drank alcohol for a month now and use of marijuana decreased  to 2-3 x per week. She is still anxious but less depressed and not hopeless or suicidal. Laraine admits to brief  periods of increased energy, irritability - she has chronic problems with episodic insomnia. Ambien was changed to trazodone which works well and again she does not need it daily. She had been dx with having ADHD and was on Adderall then Strattera - reports that she did not like either and does not wish to be on any medications for ADD.It is possible that she is suffering from a bipolar spectrum disorder rather than unipolar depression (some mood swings, ADHD symptoms hx, polysubstance abuse). 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  Dx: MDD recurrent,mild; GAD; Alcohol use disorder in early remission; Cannabis use disorder; ADHD by hx  Plan:Continue40 mg fluoxetine and trazodone 50-100 mg prn insomnia. I will add aripiprazole 5 mg to augment fluoxetine and provide some mood stabilization. Wev also discussed use of Lamictal as an alternative should aripiprazole not be well tolerated (weight gain) or not helpful. She will continue counseling.Next visit with me in one month.The plan was discussed with patient who had an opportunity to ask questions and these were all answered. I spend25 minutes inphone consultationwith the patient    Magdalene Patricia, MD 07/06/2019, 8:12 AM

## 2019-07-09 ENCOUNTER — Encounter (HOSPITAL_COMMUNITY): Payer: BC Managed Care – PPO

## 2019-07-11 ENCOUNTER — Encounter (HOSPITAL_COMMUNITY): Payer: BC Managed Care – PPO

## 2019-07-12 ENCOUNTER — Encounter (HOSPITAL_COMMUNITY): Payer: BC Managed Care – PPO

## 2019-07-16 ENCOUNTER — Encounter (HOSPITAL_COMMUNITY): Payer: BC Managed Care – PPO

## 2019-07-18 ENCOUNTER — Encounter (HOSPITAL_COMMUNITY): Payer: BC Managed Care – PPO

## 2019-07-19 ENCOUNTER — Encounter (HOSPITAL_COMMUNITY): Payer: BC Managed Care – PPO

## 2019-07-25 ENCOUNTER — Ambulatory Visit (HOSPITAL_COMMUNITY): Payer: BC Managed Care – PPO | Admitting: Psychiatry

## 2019-08-02 DIAGNOSIS — Z202 Contact with and (suspected) exposure to infections with a predominantly sexual mode of transmission: Secondary | ICD-10-CM | POA: Diagnosis not present

## 2019-08-02 DIAGNOSIS — Z30431 Encounter for routine checking of intrauterine contraceptive device: Secondary | ICD-10-CM | POA: Diagnosis not present

## 2019-08-02 DIAGNOSIS — Z130 Encounter for screening for diseases of the blood and blood-forming organs and certain disorders involving the immune mechanism: Secondary | ICD-10-CM | POA: Diagnosis not present

## 2019-08-02 DIAGNOSIS — Z01419 Encounter for gynecological examination (general) (routine) without abnormal findings: Secondary | ICD-10-CM | POA: Diagnosis not present

## 2019-08-08 DIAGNOSIS — L508 Other urticaria: Secondary | ICD-10-CM | POA: Diagnosis not present

## 2019-08-09 ENCOUNTER — Ambulatory Visit (INDEPENDENT_AMBULATORY_CARE_PROVIDER_SITE_OTHER): Payer: BC Managed Care – PPO | Admitting: Psychiatry

## 2019-08-09 ENCOUNTER — Other Ambulatory Visit: Payer: Self-pay

## 2019-08-09 DIAGNOSIS — F330 Major depressive disorder, recurrent, mild: Secondary | ICD-10-CM

## 2019-08-09 DIAGNOSIS — F1210 Cannabis abuse, uncomplicated: Secondary | ICD-10-CM

## 2019-08-09 DIAGNOSIS — F411 Generalized anxiety disorder: Secondary | ICD-10-CM | POA: Diagnosis not present

## 2019-08-09 DIAGNOSIS — F1021 Alcohol dependence, in remission: Secondary | ICD-10-CM

## 2019-08-09 MED ORDER — ARIPIPRAZOLE 5 MG PO TABS: 5.0000 mg | ORAL_TABLET | Freq: Every day | ORAL | 2 refills | Status: DC

## 2019-08-09 MED ORDER — TRAZODONE HCL 100 MG PO TABS: 100.0000 mg | ORAL_TABLET | Freq: Every evening | ORAL | 0 refills | Status: DC | PRN

## 2019-08-09 MED ORDER — FLUOXETINE HCL 40 MG PO CAPS: 40.0000 mg | ORAL_CAPSULE | Freq: Every day | ORAL | 0 refills | Status: DC

## 2019-08-09 NOTE — Progress Notes (Signed)
BH MD/PA/NP OP Progress Note  08/09/2019 9:37 AM Tabitha Hawkins  MRN:  [id number] Interview was conducted by phone and I verified that I was speaking with the correct person using two identifiers. I discussed the limitations of evaluation and management by telemedicine and  the availability of in person appointments. Patient expressed understanding and agreed to proceed.  Chief Complaint: "I am doing better".  HPI: 24 yo single white famale with hx of depression and past dx of ADD whose mood recently declined to the point that she started to feel suicidal and cut her right wrist. She was admitted to [facility] on 12/21/18 and stayed for 5 days. She associates onset of depression with her having to leave college (was at [employer]) and end of her volleyball career due to recurrent concussions. She admits to having previous SI but never attempted suicide before. She was prescribed fluoxetine 20 mg by her PCP. Dose of fluoxetine was doubled during this hospitalization and her mood has quickly improved. She was in remission of depressive symptoms,denied having excessive worrying and had not used hydroxyzine since discharge from [facility].She was also prescribed zolpidem for insomnia.Shedeniedsmokingcannabis when we had a session in early October but since then admited that she did not quit using it (has been smoking cannabis since age 28). In mid November her anxiety and depression increased (no clear trigger) and she started to drink alcohol excessively - up to 12 pack of beer daily. Her cannabis use also increased to several per day. She eventually came to [facility] as a walk in but was not admitted and instead referred to CD-IOP. She participated in 3 sessions then stoppe and has started individual therapy which she has on weekly basis. She has not drank alcohol for over two months now and use of marijuana decreased to 2-3 x per week. She is mildly anxious and less depressed She is not hopeless or suicidal.  Tabitha Hawkins admits to brief periods of increased energy, irritability - she has chronic problems with episodic insomnia. Ambien was changed to trazodone which works well. She had been dx with having ADHD and was on Adderall then Strattera - reports that she did not like either and does not wish to be on any medications for ADD.It is possible that she is suffering from a bipolar spectrum disorder rather than unipolar depression (some mood swings, ADHD symptoms hx, polysubstance abuse). WE have added a low 5 mg dose of aripiprazole and her mood has improved and she tolerates it well. 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    Visit Diagnosis:    ICD-10-CM   1. Major depressive disorder, recurrent episode, mild (HCC)  F33.0   2. GAD (generalized anxiety disorder)  F41.1   3. Cannabis use disorder, mild, abuse  F12.10   4. Alcohol use disorder, moderate, in early remission (HCC)  F10.21     Past Psychiatric History: Please see intake H&P>  Past Medical History:  Past Medical History:  Diagnosis Date  . ADHD (attention deficit hyperactivity disorder)   . H/O multiple concussions    seen by neuro  . Headache    frequent  . Short-term memory loss    cause from multiple concussions?    Past Surgical History:  Procedure Laterality Date  . BUNIONECTOMY    . KNEE ARTHROSCOPY Left 2016    Family Psychiatric History: Reviewed.  Family History:  Family History  Adopted: Yes  Problem Relation Age of Onset  .  Drug abuse Mother     Social History:  Social History   Socioeconomic History  . Marital status: Single    Spouse name: Not on file  . Number of children: 0  . Years of education: Not on file  . Highest education level: Not on file  Occupational History  . Occupation: waitress   Tobacco Use  . Smoking status: Never Smoker  . Smokeless tobacco: Never Used  Substance and Sexual Activity  . Alcohol use: Yes    Comment: occ  . Drug use: Yes     Types: Marijuana  . Sexual activity: Yes    Birth control/protection: Implant  Other Topics Concern  . Not on file  Social History Narrative   Household-- father and mother    Stopped college [employer], [place] d/t a concussion, couldn't play volley-ball 05-2015       Social Determinants of Health   Financial Resource Strain:   . Difficulty of Paying Living Expenses: Not on file  Food Insecurity:   . Worried About Programme researcher, broadcasting/film/video in the Last Year: Not on file  . Ran Out of Food in the Last Year: Not on file  Transportation Needs:   . Lack of Transportation (Medical): Not on file  . Lack of Transportation (Non-Medical): Not on file  Physical Activity:   . Days of Exercise per Week: Not on file  . Minutes of Exercise per Session: Not on file  Stress:   . Feeling of Stress : Not on file  Social Connections:   . Frequency of Communication with Friends and Family: Not on file  . Frequency of Social Gatherings with Friends and Family: Not on file  . Attends Religious Services: Not on file  . Active Member of Clubs or Organizations: Not on file  . Attends Banker Meetings: Not on file  . Marital Status: Not on file    Allergies: No Known Allergies  Metabolic Disorder Labs: Lab Results  Component Value Date   HGBA1C 4.3 (L) 12/22/2018   MPG 76.71 12/22/2018   No results found for: PROLACTIN Lab Results  Component Value Date   CHOL 207 (H) 12/22/2018   TRIG 89 12/22/2018   HDL 106 12/22/2018   CHOLHDL 2.0 12/22/2018   VLDL 18 12/22/2018   LDLCALC 83 12/22/2018   Lab Results  Component Value Date   TSH 3.452 12/22/2018   TSH 0.79 07/29/2016    Therapeutic Level Labs: No results found for: LITHIUM No results found for: VALPROATE No components found for:  CBMZ  Current Medications: Current Outpatient Medications  Medication Sig Dispense Refill  . albuterol (VENTOLIN HFA) 108 (90 Base) MCG/ACT inhaler Inhale 2 puffs into the lungs every 6  (six) hours as needed for wheezing or shortness of breath. 1 Inhaler 1  . ARIPiprazole (ABILIFY) 5 MG tablet Take 1 tablet (5 mg total) by mouth daily. 30 tablet 2  . cephALEXin (KEFLEX) 500 MG capsule Take 500 mg by mouth 4 (four) times daily.    Marland Kitchen FLUoxetine (PROZAC) 40 MG capsule Take 1 capsule (40 mg total) by mouth daily. 90 capsule 0  . Levonorgestrel (KYLEENA) 19.5 MG IUD Kyleena 17.5 mcg/24 hrs (76yrs) 19.5mg  intrauterine device  Take 1 device by intrauterine route.    . traZODone (DESYREL) 100 MG tablet Take 1 tablet (100 mg total) by mouth at bedtime as needed for sleep. 90 tablet 0   No current facility-administered medications for this visit.    Psychiatric Specialty Exam:  Review of Systems  Psychiatric/Behavioral: Positive for sleep disturbance. The patient is nervous/anxious.   All other systems reviewed and are negative.   There were no vitals taken for this visit.There is no height or weight on file to calculate BMI.  General Appearance: NA  Eye Contact:  NA  Speech:  Clear and Coherent and Normal Rate  Volume:  Normal  Mood:  MIld anxiety.  Affect:  NA  Thought Process:  Goal Directed and Linear  Orientation:  Full (Time, Place, and Person)  Thought Content: Logical   Suicidal Thoughts:  No  Homicidal Thoughts:  No  Memory:  Immediate;   Good Recent;   Good Remote;   Good  Judgement:  Fair  Insight:  Fair  Psychomotor Activity:  NA  Concentration:  Concentration: Fair  Recall:  Good  Fund of Knowledge: Good  Language: Good  Akathisia:  Negative  Handed:  Right  AIMS (if indicated): not done  Assets:  Communication Skills Desire for Improvement Financial Resources/Insurance Housing Social Support Talents/Skills  ADL's:  Intact  Cognition: WNL  Sleep:  Fair   Screenings: AIMS     [facility]  AIMS Total Score  0    AUDIT     Counselor from [facility]END[facility]Y [facility]  Alcohol Use Disorder Identification Test Final Score (AUDIT)  24  0    GAD-7     Counselor from 06/04/2019 [facility]END[facility]  Total GAD-7 Score  6    PHQ2-9     Counselor from 06/04/2019 in [facility]ENS[facility] CHEMICAL DEPENDENCY Office Visit from 05/02/2019 in [facility] Office Visit from 05/11/2018 in [facility] at [facility]  PHQ-2 Total Score  2  2  0  PHQ-9 Total Score  10  9  0       Assessment and Plan: 24 yo single white famale with hx of depression and past dx of ADD whose mood recently declined to the point that she started to feel suicidal and cut her right wrist. She was admitted to [facility] on 12/21/18 and stayed for 5 days. She associates onset of depression with her having to leave college (was at [employer]) and end of her volleyball career due to recurrent concussions. She admits to having previous SI but never attempted suicide before. She was prescribed fluoxetine 20 mg by her PCP. Dose of fluoxetine was doubled during this hospitalization and her mood has quickly improved. She was in remission of depressive symptoms,denied having excessive worrying and had not used hydroxyzine since discharge from [facility].She was also prescribed zolpidem for insomnia.Shedeniedsmokingcannabis when we had a session in early October but since then admited that she did not quit using it (has been smoking cannabis since age 19). In mid November her anxiety and depression increased (no clear trigger) and she started to drink alcohol excessively - up to 12 pack of beer daily. Her cannabis use also increased to several per day. She eventually came to [facility] as a walk in but was not admitted and instead referred to CD-IOP. She participated in 3 sessions then stoppe and has  started individual therapy which she has on weekly basis. She has not drank alcohol for over two months now and use of marijuana decreased to 2-3 x per week. She is mildly  anxious and less depressed She is not hopeless or suicidal. Tabitha Hawkins admits to brief periods of increased energy, irritability - she has chronic problems with episodic insomnia. Ambien was changed to trazodone which works well. She had been dx with having ADHD and was on Adderall then Strattera - reports that she did not like either and does not wish to be on any medications for ADD.It is possible that she is suffering from a bipolar spectrum disorder rather than unipolar depression (some mood swings, ADHD symptoms hx, polysubstance abuse). WE have added a low 5 mg dose of aripiprazole and her mood has improved and she tolerates it well. 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  Dx: MDD recurrent,mild; GAD; Alcohol use disorder in early remission; Cannabis use disorder; ADHD by hx  Plan:Continue40 mg fluoxetine, aripiprazole 5 mg and trazodone 100 mg prn insomnia.Next visit with me in two month.The plan was discussed with patient who had an opportunity to ask questions and these were all answered. I spend25 minutes inphone consultationwith the patient.    Tabitha Acre, MD 08/09/2019, 9:37 AM

## 2019-09-18 ENCOUNTER — Telehealth: Payer: Self-pay

## 2019-09-18 NOTE — Telephone Encounter (Signed)
Patient called in to update Dr. Drue Novel and the nurse with her pharmacy update the patient will use the following pharmacy going forward.   [facility]     [address] [place], [place], [place] [address]  Phone:[phone]

## 2019-09-18 NOTE — Telephone Encounter (Signed)
Pharmacy updated.

## 2019-10-03 ENCOUNTER — Ambulatory Visit (HOSPITAL_COMMUNITY): Payer: BC Managed Care – PPO | Admitting: Psychiatry

## 2019-10-03 ENCOUNTER — Other Ambulatory Visit: Payer: Self-pay

## 2019-10-03 ENCOUNTER — Ambulatory Visit (INDEPENDENT_AMBULATORY_CARE_PROVIDER_SITE_OTHER): Payer: BC Managed Care – PPO | Admitting: Psychiatry

## 2019-10-03 DIAGNOSIS — F411 Generalized anxiety disorder: Secondary | ICD-10-CM

## 2019-10-03 DIAGNOSIS — F1210 Cannabis abuse, uncomplicated: Secondary | ICD-10-CM | POA: Diagnosis not present

## 2019-10-03 DIAGNOSIS — F1021 Alcohol dependence, in remission: Secondary | ICD-10-CM

## 2019-10-03 DIAGNOSIS — F330 Major depressive disorder, recurrent, mild: Secondary | ICD-10-CM

## 2019-10-03 MED ORDER — FLUOXETINE HCL 40 MG PO CAPS: 40.0000 mg | ORAL_CAPSULE | Freq: Every day | ORAL | 0 refills | Status: DC

## 2019-10-03 MED ORDER — ZOLPIDEM TARTRATE ER 6.25 MG PO TBCR: 6.2500 mg | EXTENDED_RELEASE_TABLET | Freq: Every evening | ORAL | 2 refills | Status: DC | PRN

## 2019-10-03 MED ORDER — ARIPIPRAZOLE 5 MG PO TABS: 5.0000 mg | ORAL_TABLET | Freq: Every day | ORAL | 2 refills | Status: DC

## 2019-10-03 NOTE — Progress Notes (Signed)
[place] MD/PA/NP OP Progress Note  10/03/2019 11:37 AM SHEMAIAH ROUND  MRN:  [id number] Interview was conducted by phone and I verified that I was speaking with the correct person using two identifiers. I discussed the limitations of evaluation and management by telemedicine and  the availability of in person appointments. Patient expressed understanding and agreed to proceed.  Chief Complaint: Insomnia.  HPI: 24 yo single white famale with hx of depression and past dx of ADD whose mood recently declined to the point that she started to feel suicidal and cut her right wrist. She was admitted to [facility] on 12/21/18 and stayed for 5 days. She associates onset of depression with her having to leave college (was at [employer]) and end of her volleyball career due to recurrent concussions. She admits to having previous SI but never attempted suicide before. She was prescribed fluoxetine 20 mg by her PCP. Dose of fluoxetine was doubled during this hospitalization and her mood has quickly improved. In mid November her anxiety and depression increased (no clear trigger) and she started to drink alcohol excessively - up to 12 pack of beer daily. Her cannabis use also increased to several per day. She eventually came to [facility] as a walk in but was not admitted and instead referred to [facility]. She participated in 3 sessions then stopped and has started individual therapy which she has on weekly basis. She now only drinks alcohol ("a little bit on occasion") use of marijuana decreased to 2-3 x per week. She is mildly anxious and less depressed - not hopeless or suicidal. Takerra admits to brief periods of increased energy, irritability - she has chronic problems with episodic insomnia. Ambien was changed to trazodone which works not as well.She had been dx with having ADHD and was on Adderall then Strattera - reports that she did not like either and does not wish to be on any medications for ADD.We have added a low 5 mg  dose of aripiprazole and her mood has improved and she tolerates it well. 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  Visit Diagnosis:    ICD-10-CM   1. GAD (generalized anxiety disorder)  F41.1   2. Cannabis use disorder, mild, abuse  F12.10   3. Major depressive disorder, recurrent episode, mild (HCC)  F33.0   4. Alcohol use disorder, moderate, in early remission ([place])  F10.21     Past Psychiatric History: Please see intake H&P.  Past Medical History:  Past Medical History:  Diagnosis Date  . ADHD (attention deficit hyperactivity disorder)   . H/O multiple concussions    seen by neuro  . Headache    frequent  . Short-term memory loss    cause from multiple concussions?    Past Surgical History:  Procedure Laterality Date  . BUNIONECTOMY    . KNEE ARTHROSCOPY Left 2016    Family Psychiatric History: Reviewed.  Family History:  Family History  Adopted: Yes  Problem Relation Age of Onset  . Drug abuse Mother     Social History:  Social History   Socioeconomic History  . Marital status: Single    Spouse name: Not on file  . Number of children: 0  . Years of education: Not on file  . Highest education level: Not on file  Occupational History  . Occupation: waitress   Tobacco Use  . Smoking status: Never Smoker  . Smokeless tobacco: Never Used  Substance and Sexual Activity  . Alcohol  use: Yes    Comment: occ  . Drug use: Yes    Types: Marijuana  . Sexual activity: Yes    Birth control/protection: Implant  Other Topics Concern  . Not on file  Social History Narrative   Household-- father and mother    Stopped college [employer], [place] d/t a concussion, couldn't play volley-ball 05-2015       Social Determinants of Health   Financial Resource Strain:   . Difficulty of Paying Living Expenses:   Food Insecurity:   . Worried About Programme researcher, broadcasting/film/video in the Last Year:   . Barista in the Last Year:    Transportation Needs:   . Freight forwarder (Medical):   Marland Kitchen Lack of Transportation (Non-Medical):   Physical Activity:   . Days of Exercise per Week:   . Minutes of Exercise per Session:   Stress:   . Feeling of Stress :   Social Connections:   . Frequency of Communication with Friends and Family:   . Frequency of Social Gatherings with Friends and Family:   . Attends Religious Services:   . Active Member of Clubs or Organizations:   . Attends Banker Meetings:   Marland Kitchen Marital Status:     Allergies: No Known Allergies  Metabolic Disorder Labs: Lab Results  Component Value Date   HGBA1C 4.3 (L) 12/22/2018   MPG 76.71 12/22/2018   No results found for: PROLACTIN Lab Results  Component Value Date   CHOL 207 (H) 12/22/2018   TRIG 89 12/22/2018   HDL 106 12/22/2018   CHOLHDL 2.0 12/22/2018   VLDL 18 12/22/2018   LDLCALC 83 12/22/2018   Lab Results  Component Value Date   TSH 3.452 12/22/2018   TSH 0.79 07/29/2016    Therapeutic Level Labs: No results found for: LITHIUM No results found for: VALPROATE No components found for:  CBMZ  Current Medications: Current Outpatient Medications  Medication Sig Dispense Refill  . albuterol (VENTOLIN HFA) 108 (90 Base) MCG/ACT inhaler Inhale 2 puffs into the lungs every 6 (six) hours as needed for wheezing or shortness of breath. 1 Inhaler 1  . ARIPiprazole (ABILIFY) 5 MG tablet Take 1 tablet (5 mg total) by mouth daily. 30 tablet 2  . cephALEXin (KEFLEX) 500 MG capsule Take 500 mg by mouth 4 (four) times daily.    Marland Kitchen FLUoxetine (PROZAC) 40 MG capsule Take 1 capsule (40 mg total) by mouth daily. 90 capsule 0  . Levonorgestrel (KYLEENA) 19.5 MG IUD Kyleena 17.5 mcg/24 hrs (65yrs) 19.5mg  intrauterine device  Take 1 device by intrauterine route.    Marland Kitchen zolpidem (AMBIEN CR) 6.25 MG CR tablet Take 1 tablet (6.25 mg total) by mouth at bedtime as needed for sleep. 30 tablet 2   No current facility-administered medications  for this visit.      Psychiatric Specialty Exam: Review of Systems  Psychiatric/Behavioral: The patient is nervous/anxious.   All other systems reviewed and are negative.   There were no vitals taken for this visit.There is no height or weight on file to calculate BMI.  General Appearance: NA  Eye Contact:  NA  Speech:  Clear and Coherent and Normal Rate  Volume:  Normal  Mood:  MIld anxiety  Affect:  NA  Thought Process:  Goal Directed and Linear  Orientation:  Full (Time, Place, and Person)  Thought Content: Logical   Suicidal Thoughts:  No  Homicidal Thoughts:  No  Memory:  Immediate;  Good Recent;   Good Remote;   Good  Judgement:  Fair  Insight:  Fair  Psychomotor Activity:  NA  Concentration:  Concentration: Fair  Recall:  Good  Fund of Knowledge: Good  Language: Good  Akathisia:  Negative  Handed:  Right  AIMS (if indicated): not done  Assets:  Communication Skills Desire for Improvement Financial Resources/Insurance Housing Physical Health Talents/Skills  ADL's:  Intact  Cognition: WNL  Sleep:  Fair   Screenings: AIMS     [facility]  AIMS Total Score  0    AUDIT     Counselor from 06/04/2019 in [facility] Ad[facility] (Discharged[facility]  Alcohol Use Disorder Identification Test Final Score (AUDIT)  24  0    GAD-7     Counselor from 06/04/2019 in [facility]  Total GAD-7 Score  6    PHQ2-9     Counselor from 06/04/2019 in [facility]END[facility]Y Office Visit from 05/02/2019 in [facility] Office Visit from 05/11/2018 in [facility]  PHQ-2 Total Score  2  2  0  PHQ-9 Total Score  10  9  0       Assessment and Plan: 25 yo single white famale  with hx of depression and past dx of ADD whose mood recently declined to the point that she started to feel suicidal and cut her right wrist. She was admitted to [facility] on 12/21/18 and stayed for 5 days. She associates onset of depression with her having to leave college (was at [employer] [place] [employer]) and end of her volleyball career due to recurrent concussions. She admits to having previous SI but never attempted suicide before. She was prescribed fluoxetine 20 mg by her PCP. Dose of fluoxetine was doubled during this hospitalization and her mood has quickly improved. In mid November her anxiety and depression increased (no clear trigger) and she started to drink alcohol excessively - up to 12 pack of beer daily. Her cannabis use also increased to several per day. She eventually came to [facility] as a walk in but was not admitted and instead referred to [facility]. She participated in 3 sessions then stopped and has started individual therapy which she has on weekly basis. She now only drinks alcohol ("a little bit on occasion") use of marijuana decreased to 2-3 x per week. She is mildly anxious and less depressed - not hopeless or suicidal. Azusena admits to brief periods of increased energy, irritability - she has chronic problems with episodic insomnia. Ambien was changed to trazodone which works not as well.She had been dx with having ADHD and was on Adderall then Strattera - reports that she did not like either and does not wish to be on any medications for ADD.We have added a low 5 mg dose of aripiprazole and her mood has improved and she tolerates it well. Xzandria reports staying busy "working a Investment banker, operational at [employer].  Dx: MDD recurrent,mild; GAD; Alcohol use disorder in early remission; Cannabis use disorder; ADHD by hx  Plan:Continue40 mg fluoxetine, aripiprazole 5 mgand restart Ambien CR 6.25 mg prn insomnia instead of trazodone.Next visit with me intwo month.The plan was discussed with  patient who had an opportunity to ask questions and these were all answered. I spend20 minutes inphone consultationwith the patient.  Magdalene Patricia, MD 10/03/2019, 11:37 AM

## 2019-11-19 ENCOUNTER — Encounter (HOSPITAL_COMMUNITY): Payer: Self-pay

## 2019-11-19 ENCOUNTER — Ambulatory Visit (HOSPITAL_COMMUNITY)
Admission: EM | Admit: 2019-11-19 | Discharge: 2019-11-19 | Disposition: A | Discharge status: Home / Self Care | Payer: BC Managed Care – PPO | Attending: Family Medicine | Admitting: Family Medicine

## 2019-11-19 ENCOUNTER — Other Ambulatory Visit: Payer: Self-pay

## 2019-11-19 DIAGNOSIS — F330 Major depressive disorder, recurrent, mild: Secondary | ICD-10-CM | POA: Insufficient documentation

## 2019-11-19 DIAGNOSIS — J40 Bronchitis, not specified as acute or chronic: Secondary | ICD-10-CM | POA: Diagnosis not present

## 2019-11-19 DIAGNOSIS — Z79899 Other long term (current) drug therapy: Secondary | ICD-10-CM | POA: Diagnosis not present

## 2019-11-19 DIAGNOSIS — F419 Anxiety disorder, unspecified: Secondary | ICD-10-CM | POA: Diagnosis not present

## 2019-11-19 DIAGNOSIS — L500 Allergic urticaria: Secondary | ICD-10-CM | POA: Diagnosis not present

## 2019-11-19 DIAGNOSIS — Z793 Long term (current) use of hormonal contraceptives: Secondary | ICD-10-CM | POA: Insufficient documentation

## 2019-11-19 DIAGNOSIS — Z20822 Contact with and (suspected) exposure to covid-19: Secondary | ICD-10-CM | POA: Insufficient documentation

## 2019-11-19 DIAGNOSIS — R413 Other amnesia: Secondary | ICD-10-CM | POA: Insufficient documentation

## 2019-11-19 MED ORDER — HYDROXYZINE HCL 25 MG PO TABS: 25.0000 mg | ORAL_TABLET | Freq: Four times a day (QID) | ORAL | 0 refills | Status: DC

## 2019-11-19 MED ORDER — BENZONATATE 100 MG PO CAPS: 100.0000 mg | ORAL_CAPSULE | Freq: Three times a day (TID) | ORAL | 0 refills | Status: DC

## 2019-11-19 MED ORDER — PREDNISONE 10 MG PO TABS: 40.0000 mg | ORAL_TABLET | Freq: Every day | ORAL | 0 refills | Status: AC

## 2019-11-19 MED ORDER — ALBUTEROL SULFATE HFA 108 (90 BASE) MCG/ACT IN AERS: 1.0000 | INHALATION_SPRAY | Freq: Four times a day (QID) | RESPIRATORY_TRACT | 1 refills | Status: DC | PRN

## 2019-11-19 NOTE — ED Triage Notes (Signed)
Pt c/o productive cough w/clear sputum, SOBx1 wk. Pt c/o hives all over bodyx2 mos. Pt has non labored breathing. Pt has expiratory wheeze in right upper lobe, inspiratory and expiratory wheeze in left lower lobe, coarse lung sounds throughout.PT has patches of red raised areas on legs bilat, left shoulder, right arm, right upper eye lid and back.

## 2019-11-19 NOTE — Discharge Instructions (Signed)
Treating you for bronchitis.  Take the medication as prescribed. Hydroxyzine for itching.  Be aware this may make you drowsy. Follow up as needed for continued or worsening symptoms

## 2019-11-19 NOTE — ED Provider Notes (Signed)
[facility]    CSN: [id number] Arrival date & time: 11/19/19  0815      History   Chief Complaint Chief Complaint  Patient presents with  . Cough    HPI Tabitha JASMER is a 24 y.o. female.   Patient is a 24 year old female past medical history of asthma and bronchitis.  She presents today with productive cough, clear sputum, mild shortness of breath x1 week.  Symptoms have been constant.  She has been using her albuterol inhaler with some relief.  Concerned it may be old.  Coarse cough and wheezing.  Has not been taking anything else for symptoms.  Denies any fevers, fatigue, chills, body aches, night sweats, loss of taste or smell.  Mild nasal congestion.  She is also had hives for the past 2 months.  Has been evaluated waited for this prior and has been told to take antihistamines.  She is been taking Zyrtec and Benadryl daily but that has remained.  Was told probably stress-induced.  Very pruritic at times.  Urticaria is located to bilateral lower extremities, left shoulder, right arm and right upper eyelid.   Denies any fever, joint pain. Denies any recent changes in lotions, detergents, foods or other possible irritants. No recent travel. Nobody else at home has the rash. Patient has been outside but denies any contact with plants or insects. No new foods or medications.     ROS per HPI      Past Medical History:  Diagnosis Date  . ADHD (attention deficit hyperactivity disorder)   . H/O multiple concussions    seen by neuro  . Headache    frequent  . Short-term memory loss    cause from multiple concussions?    Patient Active Problem List   Diagnosis Date Noted  . GAD (generalized anxiety disorder) 07/06/2019  . Cannabis use disorder, mild, abuse 07/06/2019  . Alcohol use disorder, moderate, in early remission (HCC) 07/06/2019  . Headache   . H/O multiple concussions   . Dyslexia, developmental   . Short-term memory loss   . Major depressive disorder,  recurrent episode, mild (HCC) 12/21/2018  . Asthma 02/09/2017  . Anxiety and depression 07/30/2016  . PCP NOTES >>>>> 06/02/2015  . Annual physical exam 12/27/2011  .  ADD (attention deficit disorder) 12/27/2011    Past Surgical History:  Procedure Laterality Date  . BUNIONECTOMY    . KNEE ARTHROSCOPY Left 2016    OB History   No obstetric history on file.      Home Medications    Prior to Admission medications   Medication Sig Start Date End Date Taking? Authorizing Provider  albuterol (VENTOLIN HFA) 108 (90 Base) MCG/ACT inhaler Inhale 1-2 puffs into the lungs every 6 (six) hours as needed for wheezing or shortness of breath. 11/19/19   Sabeen Piechocki, Gloris Manchester A, NP  ARIPiprazole (ABILIFY) 5 MG tablet Take 1 tablet (5 mg total) by mouth daily. 10/03/19 01/01/20  Pucilowski, Roosvelt Maser, MD  benzonatate (TESSALON) 100 MG capsule Take 1 capsule (100 mg total) by mouth every 8 (eight) hours. 11/19/19   Dahlia Byes A, NP  FLUoxetine (PROZAC) 40 MG capsule Take 1 capsule (40 mg total) by mouth daily. 10/03/19 01/01/20  Pucilowski, Roosvelt Maser, MD  hydrOXYzine (ATARAX/VISTARIL) 25 MG tablet Take 1 tablet (25 mg total) by mouth every 6 (six) hours. Take one half tab to full tab every 6 hours as needed for itching 11/19/19   Janace Aris, NP  Levonorgestrel [employer])  19.5 MG IUD Kyleena 17.5 mcg/24 hrs (77yrs) 19.5mg  intrauterine device  Take 1 device by intrauterine route.    [provider]  predniSONE (DELTASONE) 10 MG tablet Take 4 tablets (40 mg total) by mouth daily for 5 days. 11/19/19 11/24/19  Dahlia Byes A, NP  zolpidem (AMBIEN CR) 6.25 MG CR tablet Take 1 tablet (6.25 mg total) by mouth at bedtime as needed for sleep. 10/03/19 01/01/20  Pucilowski, Roosvelt Maser, MD    Family History Family History  Adopted: Yes  Problem Relation Age of Onset  . Drug abuse Mother     Social History Social History   Tobacco Use  . Smoking status: Never Smoker  . Smokeless tobacco: Never Used  Substance  Use Topics  . Alcohol use: Yes    Comment: occ  . Drug use: Yes    Types: Marijuana     Allergies   Patient has no known allergies.   Review of Systems Review of Systems   Physical Exam Triage Vital Signs ED Triage Vitals  Enc Vitals Group     BP 11/19/19 0844 107/71     Pulse Rate 11/19/19 0844 66     Resp 11/19/19 0844 18     Temp 11/19/19 0844 98.3 F (36.8 C)     Temp Source 11/19/19 0844 Oral     SpO2 11/19/19 0844 100 %     Weight 11/19/19 0845 175 lb (79.4 kg)     Height 11/19/19 0845 5\' 9"  (1.753 m)     Head Circumference --      Peak Flow --      Pain Score 11/19/19 0845 7     Pain Loc --      Pain Edu? --      Excl. in GC? --    No data found.  Updated Vital Signs BP 107/71   Pulse 66   Temp 98.3 F (36.8 C) (Oral)   Resp 18   Ht 5\' 9"  (1.753 m)   Wt 175 lb (79.4 kg)   SpO2 100%   BMI 25.84 kg/m   Visual Acuity Right Eye Distance:   Left Eye Distance:   Bilateral Distance:    Right Eye Near:   Left Eye Near:    Bilateral Near:     Physical Exam Vitals and nursing note reviewed.  Constitutional:      General: She is not in acute distress.    Appearance: Normal appearance. She is not ill-appearing, toxic-appearing or diaphoretic.  HENT:     Head: Normocephalic.     Nose: Nose normal.  Eyes:     Conjunctiva/sclera: Conjunctivae normal.  Cardiovascular:     Rate and Rhythm: Normal rate and regular rhythm.  Pulmonary:     Effort: Pulmonary effort is normal.     Breath sounds: Wheezing and rhonchi present.     Comments: Rhonchi and expiratory wheezing throughout all lung fields Musculoskeletal:        General: Normal range of motion.     Cervical back: Normal range of motion.  Skin:    General: Skin is warm and dry.     Findings: No rash.     Comments: Widespread urticaria  Neurological:     Mental Status: She is alert.  Psychiatric:        Mood and Affect: Mood normal.      UC Treatments / Results  Labs (all labs ordered  are listed, but only abnormal results are displayed) Labs Reviewed  SARS  CORONAVIRUS 2 (TAT 6-24 HRS)    EKG   Radiology No results found.  Procedures Procedures (including critical care time)  Medications Ordered in UC Medications - No data to display  Initial Impression / Assessment and Plan / UC Course  I have reviewed the triage vital signs and the nursing notes.  Pertinent labs & imaging results that were available during my care of the patient were reviewed by me and considered in my medical decision making (see chart for details).     Bronchitis Patient with expiratory wheezing and rhonchi throughout lung fields.  Will treat with prednisone burst.  Albuterol as needed Tessalon Perles for cough.  Urticaria Treating with prednisone and will have her continue the antihistamines Hydroxyzine for itching  Follow up as needed for continued or worsening symptoms  Final Clinical Impressions(s) / UC Diagnoses   Final diagnoses:  Bronchitis  Allergic urticaria     Discharge Instructions     Treating you for bronchitis.  Take the medication as prescribed. Hydroxyzine for itching.  Be aware this may make you drowsy. Follow up as needed for continued or worsening symptoms     ED Prescriptions    Medication Sig Dispense Auth. Provider   predniSONE (DELTASONE) 10 MG tablet Take 4 tablets (40 mg total) by mouth daily for 5 days. 20 tablet Kiet Geer A, NP   albuterol (VENTOLIN HFA) 108 (90 Base) MCG/ACT inhaler Inhale 1-2 puffs into the lungs every 6 (six) hours as needed for wheezing or shortness of breath. 18 g Anvith Mauriello A, NP   benzonatate (TESSALON) 100 MG capsule Take 1 capsule (100 mg total) by mouth every 8 (eight) hours. 21 capsule Ariann Khaimov A, NP   hydrOXYzine (ATARAX/VISTARIL) 25 MG tablet Take 1 tablet (25 mg total) by mouth every 6 (six) hours. Take one half tab to full tab every 6 hours as needed for itching 20 tablet Kayley Zeiders A, NP     PDMP not  reviewed this encounter.   Orvan July, NP 11/19/19 1224

## 2019-11-20 LAB — SARS CORONAVIRUS 2 (TAT 6-24 HRS): SARS Coronavirus 2: NEGATIVE

## 2019-11-26 ENCOUNTER — Other Ambulatory Visit: Payer: Self-pay

## 2019-11-26 ENCOUNTER — Telehealth (INDEPENDENT_AMBULATORY_CARE_PROVIDER_SITE_OTHER): Payer: BC Managed Care – PPO | Admitting: Family Medicine

## 2019-11-26 DIAGNOSIS — J069 Acute upper respiratory infection, unspecified: Secondary | ICD-10-CM

## 2019-11-26 DIAGNOSIS — Z5329 Procedure and treatment not carried out because of patient's decision for other reasons: Secondary | ICD-10-CM

## 2019-11-26 NOTE — Progress Notes (Signed)
Pt no showed.

## 2019-12-06 DIAGNOSIS — L503 Dermatographic urticaria: Secondary | ICD-10-CM | POA: Diagnosis not present

## 2019-12-06 DIAGNOSIS — L508 Other urticaria: Secondary | ICD-10-CM | POA: Diagnosis not present

## 2019-12-10 ENCOUNTER — Other Ambulatory Visit: Payer: Self-pay

## 2019-12-10 ENCOUNTER — Telehealth (INDEPENDENT_AMBULATORY_CARE_PROVIDER_SITE_OTHER): Payer: BC Managed Care – PPO | Admitting: Psychiatry

## 2019-12-10 DIAGNOSIS — F330 Major depressive disorder, recurrent, mild: Secondary | ICD-10-CM | POA: Diagnosis not present

## 2019-12-10 DIAGNOSIS — F1021 Alcohol dependence, in remission: Secondary | ICD-10-CM

## 2019-12-10 DIAGNOSIS — F900 Attention-deficit hyperactivity disorder, predominantly inattentive type: Secondary | ICD-10-CM | POA: Diagnosis not present

## 2019-12-10 DIAGNOSIS — F411 Generalized anxiety disorder: Secondary | ICD-10-CM | POA: Diagnosis not present

## 2019-12-10 MED ORDER — FLUOXETINE HCL 40 MG PO CAPS: 40.0000 mg | ORAL_CAPSULE | Freq: Every day | ORAL | 0 refills | Status: DC

## 2019-12-10 MED ORDER — ARIPIPRAZOLE 5 MG PO TABS: 5.0000 mg | ORAL_TABLET | Freq: Every day | ORAL | 2 refills | Status: DC

## 2019-12-10 MED ORDER — ZOLPIDEM TARTRATE ER 6.25 MG PO TBCR: 6.2500 mg | EXTENDED_RELEASE_TABLET | Freq: Every evening | ORAL | 2 refills | Status: DC | PRN

## 2019-12-10 NOTE — Progress Notes (Signed)
[place] MD/PA/NP OP Progress Note  12/10/2019 11:38 AM RODNISHA BLOMGREN  MRN:  [id number] Interview was conducted by phone and I verified that I was speaking with the correct person using two identifiers. I discussed the limitations of evaluation and management by telemedicine and  the availability of in person appointments. Patient expressed understanding and agreed to proceed. Patient location - home; physician - home office.  Chief Complaint: Sleep problems.  HPI: 24 yo single white famale with hx of depression/anxiety and past dx of ADD. She started to feel suicidal, cut her right wrist and was admitted to [facility] on 12/21/18 and stayed for 5 days. She associates onset of depression with her having to leave college (was at [employer]) and end of her volleyball career due to recurrent concussions. She admits to having previous SI but never attempted suicide before. She was prescribed fluoxetine 20 mg by her PCP. Dose of fluoxetine was doubled during this hospitalization and her mood has quickly improved. In mid November her anxiety and depression increased (no clear trigger) and she started to drink alcohol excessively - up to 12 pack of beer daily. Her cannabis use also increased to several per day. She eventually came to [facility] as a walk in but was not admitted and instead referred to CD-IOP. She participated in 3 sessions then stopped and has started individual therapy which she has on weekly basis. She now only drinks alcohol ("a little bit on occasion") use of marijuana decreased to 2-3 x per week. She is mildlyanxious andless depressed - nothopeless or suicidal. Ellean admits to brief periods of increased energy, irritability - she has chronic problems with episodic insomnia. Ambien was changed to trazodone which works not as well so we restarted Ambien but for whatever reason it was not received by her pharmacy. She had been dx with having ADHD and was on Adderall then Strattera - reports that she  did not like either and does not wish to be on any medications for ADD.We have added a low 5 mg dose of aripiprazole and her mood has improved and she tolerates it well.  Visit Diagnosis:    ICD-10-CM   1. Major depressive disorder, recurrent episode, mild (HCC)  F33.0   2. GAD (generalized anxiety disorder)  F41.1   3. Alcohol use disorder, moderate, in early remission ([place])  F10.21   4. Attention deficit hyperactivity disorder (ADHD), predominantly inattentive type  F90.0     Past Psychiatric History: Please see intake H&P.  Past Medical History:  Past Medical History:  Diagnosis Date  . ADHD (attention deficit hyperactivity disorder)   . H/O multiple concussions    seen by neuro  . Headache    frequent  . Short-term memory loss    cause from multiple concussions?    Past Surgical History:  Procedure Laterality Date  . BUNIONECTOMY    . KNEE ARTHROSCOPY Left 2016    Family Psychiatric History: Reviewed.  Family History:  Family History  Adopted: Yes  Problem Relation Age of Onset  . Drug abuse Mother     Social History:  Social History   Socioeconomic History  . Marital status: Single    Spouse name: Not on file  . Number of children: 0  . Years of education: Not on file  . Highest education level: Not on file  Occupational History  . Occupation: waitress   Tobacco Use  . Smoking status: Never Smoker  . Smokeless tobacco: Never Used  Vaping Use  .  Vaping Use: Every day  . Substances: Nicotine  Substance and Sexual Activity  . Alcohol use: Yes    Comment: occ  . Drug use: Yes    Types: Marijuana  . Sexual activity: Yes    Birth control/protection: Implant  Other Topics Concern  . Not on file  Social History Narrative   Household-- father and mother    Stopped college [employer], [place] d/t a concussion, couldn't play volley-ball 05-2015       Social Determinants of Health   Financial Resource Strain:   . Difficulty of Paying Living  Expenses:   Food Insecurity:   . Worried About Programme researcher, broadcasting/film/video in the Last Year:   . Barista in the Last Year:   Transportation Needs:   . Freight forwarder (Medical):   Marland Kitchen Lack of Transportation (Non-Medical):   Physical Activity:   . Days of Exercise per Week:   . Minutes of Exercise per Session:   Stress:   . Feeling of Stress :   Social Connections:   . Frequency of Communication with Friends and Family:   . Frequency of Social Gatherings with Friends and Family:   . Attends Religious Services:   . Active Member of Clubs or Organizations:   . Attends Banker Meetings:   Marland Kitchen Marital Status:     Allergies: No Known Allergies  Metabolic Disorder Labs: Lab Results  Component Value Date   HGBA1C 4.3 (L) 12/22/2018   MPG 76.71 12/22/2018   No results found for: PROLACTIN Lab Results  Component Value Date   CHOL 207 (H) 12/22/2018   TRIG 89 12/22/2018   HDL 106 12/22/2018   CHOLHDL 2.0 12/22/2018   VLDL 18 12/22/2018   LDLCALC 83 12/22/2018   Lab Results  Component Value Date   TSH 3.452 12/22/2018   TSH 0.79 07/29/2016    Therapeutic Level Labs: No results found for: LITHIUM No results found for: VALPROATE No components found for:  CBMZ  Current Medications: Current Outpatient Medications  Medication Sig Dispense Refill  . albuterol (VENTOLIN HFA) 108 (90 Base) MCG/ACT inhaler Inhale 1-2 puffs into the lungs every 6 (six) hours as needed for wheezing or shortness of breath. 18 g 1  . [START ON 01/01/2020] ARIPiprazole (ABILIFY) 5 MG tablet Take 1 tablet (5 mg total) by mouth daily. 30 tablet 2  . benzonatate (TESSALON) 100 MG capsule Take 1 capsule (100 mg total) by mouth every 8 (eight) hours. 21 capsule 0  . [START ON 01/01/2020] FLUoxetine (PROZAC) 40 MG capsule Take 1 capsule (40 mg total) by mouth daily. 90 capsule 0  . hydrOXYzine (ATARAX/VISTARIL) 25 MG tablet Take 1 tablet (25 mg total) by mouth every 6 (six) hours. Take one half  tab to full tab every 6 hours as needed for itching 20 tablet 0  . Levonorgestrel (KYLEENA) 19.5 MG IUD Kyleena 17.5 mcg/24 hrs (66yrs) 19.5mg  intrauterine device  Take 1 device by intrauterine route.    Marland Kitchen zolpidem (AMBIEN CR) 6.25 MG CR tablet Take 1 tablet (6.25 mg total) by mouth at bedtime as needed for sleep. 30 tablet 2   No current facility-administered medications for this visit.     Psychiatric Specialty Exam: Review of Systems  Psychiatric/Behavioral: Positive for sleep disturbance.  All other systems reviewed and are negative.   There were no vitals taken for this visit.There is no height or weight on file to calculate BMI.  General Appearance: NA  Eye Contact:  NA  Speech:  Clear and Coherent and Normal Rate  Volume:  Normal  Mood:  Some anxiety/depression.  Affect:  NA  Thought Process:  Goal Directed and Linear  Orientation:  Full (Time, Place, and Person)  Thought Content: Logical   Suicidal Thoughts:  No  Homicidal Thoughts:  No  Memory:  Immediate;   Good Recent;   Good Remote;   Good  Judgement:  Fair  Insight:  Fair  Psychomotor Activity:  NA  Concentration:  Concentration: Good  Recall:  Good  Fund of Knowledge: Good  Language: Good  Akathisia:  Negative  Handed:  Right  AIMS (if indicated): not done  Assets:  Communication Skills Desire for Improvement Financial Resources/Insurance Housing Resilience Talents/Skills  ADL's:  Intact  Cognition: WNL  Sleep:  Fair   Screenings: AIMS     [facility]  AIMS Total Score 0    AUDIT     Counselor from 06/04/2019 in [facility] Admission (Dis[facility]  Alcohol Use Disorder Identification Test Final Score (AUDIT) 24 0    GAD-7     Counselor from 06/04/2019 in [facility]  Total GAD-7 Score 6     PHQ2-9     Counselor from 06/04/2019 in [facility]ENDENCY Office Visit from 05/02/2019 in [facility] at [facility] Office Visit from 05/11/2018 in [facility] at [employer]  PHQ-2 Total Score 2 2 0  PHQ-9 Total Score 10 9 0       Assessment and Plan: 24 yo single white famale with hx of depression and past dx of ADD. In July 2020 her mood declined to the point that she started to feel suicidal and cut her right wrist. She was admitted to [facility] on 12/21/18 and stayed for 5 days. She associates onset of depression with her having to leave college (was at [employer]) and end of her volleyball career due to recurrent concussions. She admits to having previous SI but never attempted suicide before. She was prescribed fluoxetine 20 mg by her PCP. Dose of fluoxetine was doubled during this hospitalization and her mood has quickly improved. In mid November her anxiety and depression increased (no clear trigger) and she started to drink alcohol excessively - up to 12 pack of beer daily. Her cannabis use also increased to several per day. She eventually came to [facility] as a walk in but was not admitted and instead referred to CD-IOP. She participated in 3 sessions then stopped and has started individual therapy which she has on weekly basis. She now only drinks alcohol ("a little bit on occasion") use of marijuana decreased to 2-3 x per week. She is mildlyanxious andless depressed - nothopeless or suicidal. Alfrieda admits to brief periods of increased energy, irritability - she has chronic problems with episodic insomnia. Ambien was changed to trazodone which works not as well so we restarted Ambien but for whatever reason it was not received by her pharmacy. She had been dx with having ADHD and was on Adderall then Strattera - reports that she did not like either and does not wish to be on any medications for ADD.We have  added a low 5 mg dose of aripiprazole and her mood has improved and she tolerates it well.  Dx: MDD recurrent,mild; GAD; Alcohol use disorder in early remission; Cannabis  use disorder; ADHD by hx  Plan:Continue40 mg fluoxetine, aripiprazole 5 mgand Ambien CR 6.25 mg prn insomnia.Next visit with intwomonth.The plan was discussed with patient who had an opportunity to ask questions and these were all answered. I spend15 minutes inphone consultationwith the patient.    Magdalene Patricia, MD 12/10/2019, 11:38 AM

## 2019-12-19 DIAGNOSIS — M545 Low back pain: Secondary | ICD-10-CM | POA: Diagnosis not present

## 2020-01-13 DIAGNOSIS — L509 Urticaria, unspecified: Secondary | ICD-10-CM | POA: Diagnosis not present

## 2020-01-18 DIAGNOSIS — T783XXD Angioneurotic edema, subsequent encounter: Secondary | ICD-10-CM | POA: Diagnosis not present

## 2020-01-18 DIAGNOSIS — L501 Idiopathic urticaria: Secondary | ICD-10-CM | POA: Diagnosis not present

## 2020-01-18 DIAGNOSIS — J4520 Mild intermittent asthma, uncomplicated: Secondary | ICD-10-CM | POA: Diagnosis not present

## 2020-01-18 DIAGNOSIS — J301 Allergic rhinitis due to pollen: Secondary | ICD-10-CM | POA: Diagnosis not present

## 2020-02-11 ENCOUNTER — Telehealth (INDEPENDENT_AMBULATORY_CARE_PROVIDER_SITE_OTHER): Payer: BC Managed Care – PPO | Admitting: Psychiatry

## 2020-02-11 ENCOUNTER — Other Ambulatory Visit: Payer: Self-pay

## 2020-02-11 DIAGNOSIS — F3341 Major depressive disorder, recurrent, in partial remission: Secondary | ICD-10-CM | POA: Diagnosis not present

## 2020-02-11 DIAGNOSIS — F1021 Alcohol dependence, in remission: Secondary | ICD-10-CM

## 2020-02-11 DIAGNOSIS — F1210 Cannabis abuse, uncomplicated: Secondary | ICD-10-CM | POA: Diagnosis not present

## 2020-02-11 DIAGNOSIS — F411 Generalized anxiety disorder: Secondary | ICD-10-CM

## 2020-02-11 MED ORDER — ZOLPIDEM TARTRATE ER 6.25 MG PO TBCR: 6.2500 mg | EXTENDED_RELEASE_TABLET | Freq: Every evening | ORAL | 2 refills | Status: DC | PRN

## 2020-02-11 MED ORDER — ARIPIPRAZOLE 5 MG PO TABS: 5.0000 mg | ORAL_TABLET | Freq: Every day | ORAL | 2 refills | Status: DC

## 2020-02-11 MED ORDER — FLUOXETINE HCL 40 MG PO CAPS: 40.0000 mg | ORAL_CAPSULE | Freq: Every day | ORAL | 0 refills | Status: DC

## 2020-02-11 NOTE — Progress Notes (Signed)
[facility] MD/PA/NP OP Progress Note  02/11/2020 11:42 AM Tabitha Hawkins  MRN:  [id number] Interview was conducted by phone and I verified that I was speaking with the correct person using two identifiers. I discussed the limitations of evaluation and management by telemedicine and  the availability of in person appointments. Patient expressed understanding and agreed to proceed. Patient location - home; physician - home office.  Chief Complaint: "I am doing well".  HPI: 24 yo single white famale with hx of depression and past dx of ADD. In July 2020 her mood declined to the point that she started to feel suicidal and cut her right wrist. She was admitted to [facility] on 12/21/18 and stayed for 5 days. She associates onset of depression with her having to leave college (was at [employer]) and end of her volleyball career due to recurrent concussions. She admits to having previous SI but never attempted suicide before. She was prescribed fluoxetine 20 mg by her PCP. Dose of fluoxetine was doubled during this hospitalization and her mood has quickly improved. In mid November her anxiety and depression increased (no clear trigger) and she started to drink alcohol excessively - up to 12 pack of beer daily. Her cannabis use also increased to several per day. She eventually came to [facility] as a walk in but was not admitted and instead referred to [facility]. She participated in 3 sessions then stoppedand has started individual therapy which she has on weekly basis. She now only drinks alcohol on occasion and does not lose control over how much she drinks. Heruse of marijuana decreased to 2-3 x per week. She is mildlyanxious andless depressed-nothopeless or suicidal. Ita admits to brief periods of increased energy, irritability - she has chronic problems with episodic insomnia. Ambien was changed to trazodone which worksnot aswell so we restarted Ambien. She had been dx with having ADHD and was on Adderall then  Strattera - reports that she did not like either and does not wish to be on any medications for ADD.Wehave added a low 5 mg dose of aripiprazole and her mood has improved and she tolerates it well. 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  Visit Diagnosis:    ICD-10-CM   1. GAD (generalized anxiety disorder)  F41.1   2. Cannabis use disorder, mild, abuse  F12.10   3. Major depressive disorder, recurrent episode, in partial remission (HCC)  F33.41   4. Alcohol use disorder, moderate, in early remission (HCC)  F10.21     Past Psychiatric History: Please see intake H&P.  Past Medical History:  Past Medical History:  Diagnosis Date  . ADHD (attention deficit hyperactivity disorder)   . H/O multiple concussions    seen by neuro  . Headache    frequent  . Short-term memory loss    cause from multiple concussions?    Past Surgical History:  Procedure Laterality Date  . BUNIONECTOMY    . KNEE ARTHROSCOPY Left 2016    Family Psychiatric History: Reviewed.  Family History:  Family History  Adopted: Yes  Problem Relation Age of Onset  . Drug abuse Mother     Social History:  Social History   Socioeconomic History  . Marital status: Single    Spouse name: Not on file  . Number of children: 0  . Years of education: Not on file  . Highest education level: Not on file  Occupational History  . Occupation: waitress   Tobacco Use  .  Smoking status: Never Smoker  . Smokeless tobacco: Never Used  Vaping Use  . Vaping Use: Every day  . Substances: Nicotine  Substance and Sexual Activity  . Alcohol use: Yes    Comment: occ  . Drug use: Yes    Types: Marijuana  . Sexual activity: Yes    Birth control/protection: Implant  Other Topics Concern  . Not on file  Social History Narrative   Household-- father and mother    [place]pped [place] [employer], couldn't play volley-ball 05-2015       Social Determinants of  Health   Financial Resource Strain:   . Difficulty of Paying Living Expenses: Not on file  Food Insecurity:   . Worried About Programme researcher, broadcasting/film/videounning Out of Food in the Last Year: Not on file  . Ran Out of Food in the Last Year: Not on file  Transportation Needs:   . Lack of Transportation (Medical): Not on file  . Lack of Transportation (Non-Medical): Not on file  Physical Activity:   . Days of Exercise per Week: Not on file  . Minutes of Exercise per Session: Not on file  Stress:   . Feeling of Stress : Not on file  Social Connections:   . Frequency of Communication with Friends and Family: Not on file  . Frequency of Social Gatherings with Friends and Family: Not on file  . Attends Religious Services: Not on file  . Active Member of Clubs or Organizations: Not on file  . Attends BankerClub or Organization Meetings: Not on file  . Marital Status: Not on file    Allergies: No Known Allergies  Metabolic Disorder Labs: Lab Results  Component Value Date   HGBA1C 4.3 (L) 12/22/2018   MPG 76.71 12/22/2018   No results found for: PROLACTIN Lab Results  Component Value Date   CHOL 207 (H) 12/22/2018   TRIG 89 12/22/2018   HDL 106 12/22/2018   CHOLHDL 2.0 12/22/2018   VLDL 18 12/22/2018   LDLCALC 83 12/22/2018   Lab Results  Component Value Date   TSH 3.452 12/22/2018   TSH 0.79 07/29/2016    Therapeutic Level Labs: No results found for: LITHIUM No results found for: VALPROATE No components found for:  CBMZ  Current Medications: Current Outpatient Medications  Medication Sig Dispense Refill  . albuterol (VENTOLIN HFA) 108 (90 Base) MCG/ACT inhaler Inhale 1-2 puffs into the lungs every 6 (six) hours as needed for wheezing or shortness of breath. 18 g 1  . [START ON 03/31/2020] ARIPiprazole (ABILIFY) 5 MG tablet Take 1 tablet (5 mg total) by mouth daily. 30 tablet 2  . benzonatate (TESSALON) 100 MG capsule Take 1 capsule (100 mg total) by mouth every 8 (eight) hours. 21 capsule 0  .  [START ON 03/31/2020] FLUoxetine (PROZAC) 40 MG capsule Take 1 capsule (40 mg total) by mouth daily. 90 capsule 0  . Levonorgestrel (KYLEENA) 19.5 MG IUD Kyleena 17.5 mcg/24 hrs (327yrs) 19.5mg  intrauterine device  Take 1 device by intrauterine route.    Marland Kitchen. zolpidem (AMBIEN CR) 6.25 MG CR tablet Take 1 tablet (6.25 mg total) by mouth at bedtime as needed for sleep. 30 tablet 2   No current facility-administered medications for this visit.     Psychiatric Specialty Exam: Review of Systems  Psychiatric/Behavioral: Positive for sleep disturbance. The patient is nervous/anxious.   All other systems reviewed and are negative.   There were no vitals taken for this visit.There is no height or weight on  file to calculate BMI.  General Appearance: NA  Eye Contact:  NA  Speech:  Clear and Coherent and Normal Rate  Volume:  Normal  Mood:  Minimal anxiety, no depression.  Affect:  NA  Thought Process:  Goal Directed  Orientation:  Full (Time, Place, and Person)  Thought Content: Logical   Suicidal Thoughts:  No  Homicidal Thoughts:  No  Memory:  Immediate;   Good Recent;   Good Remote;   Good  Judgement:  Fair  Insight:  Fair  Psychomotor Activity:  NA  Concentration:  Concentration: Good  Recall:  Good  Fund of Knowledge: Good  Language: Good  Akathisia:  Negative  Handed:  Right  AIMS (if indicated): not done  Assets:  Communication Skills Desire for Improvement Financial Resources/Insurance Housing Physical Health Talents/Skills  ADL's:  Intact  Cognition: WNL  Sleep:  Fair   Screenings: AIMS     [facility]  AIMS Total Score 0    AUDIT     Counselor from 06/04/2019 in [facility] DEPENDENCY Admission (Discharged) [facility]  Alcohol Use Disorder Identification Test Final Score (AUDIT) 24 0    GAD-7     Counselor from  06/04/2019 in [facility] DEPENDENCY  Total GAD-7 Score 6    PHQ2-9     Counselor from 06/04/2019 in BEHAVIORAL [facility]ALTH INTENSIVE CHEMICAL DEPENDENCY Office Visit from 05/02/2019 in [facility] Office Visit from 05/11/2018 in [facility] at [employer]  PHQ-2 Total Score 2 2 0  PHQ-9 Total Score 10 9 0       Assessment and Plan: 24 yo single white famale with hx of depression and past dx of ADD. In July 2020 her mood declined to the point that she started to feel suicidal and cut her right wrist. She was admitted to [facility] on 12/21/18 and stayed for 5 days. She associates onset of depression with her having to leave college (was at [employer]) and end of her volleyball career due to recurrent concussions. She admits to having previous SI but never attempted suicide before. She was prescribed fluoxetine 20 mg by her PCP. Dose of fluoxetine was doubled during this hospitalization and her mood has quickly improved. In mid November her anxiety and depression increased (no clear trigger) and she started to drink alcohol excessively - up to 12 pack of beer daily. Her cannabis use also increased to several per day. She eventually came to [facility] as a walk in but was not admitted and instead referred to CD-IOP. She participated in 3 sessions then stoppedand has started individual therapy which she has on weekly basis. She now only drinks alcohol on occasion and does not lose control over how much she drinks. Heruse of marijuana decreased to 2-3 x per week. She is mildlyanxious andless depressed-nothopeless or suicidal. Abygail admits to brief periods of increased energy, irritability - she has chronic problems with episodic insomnia. Ambien was changed to trazodone which worksnot aswell so we restarted Ambien. She had been dx with having ADHD and was on Adderall then Strattera - reports that she did not like  either and does not wish to be on any medications for ADD.Wehave added a low 5 mg dose of aripiprazole and her mood has improved and she tolerates it well. 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  Dx: MDD recurrent, in partial remission; GAD; Alcohol use disorder in early remission; Cannabis use disorder; ADHD by hx  Plan:Continue40 mg fluoxetine, aripiprazole 5 mgandAmbien CR 6.25 mg prn insomnia.Next visit with intwomonth.The plan was discussed with patient who had an opportunity to ask questions and these were all answered. I spend40minutes inphone consultationwith the patient.   Magdalene Patricia, MD 02/11/2020, 11:42 AM

## 2020-02-15 NOTE — Progress Notes (Signed)
Phone [phone] Virtual visit via Video note   Subjective:  Chief complaint: Chief Complaint  Patient presents with  . Sore Throat    onset: 3 days ,dry cough , white spots on tonsils   This visit type was conducted due to national recommendations for restrictions regarding the COVID-19 Pandemic (e.g. social distancing).  This format is felt to be most appropriate for this patient at this time balancing risks to patient and risks to population by having him in for in person visit.  No physical exam was performed (except for noted visual exam or audio findings with Telehealth visits).    Our team/I connected with Tabitha Hawkins at 12:20 PM EDT by a video enabled telemedicine application (doxy.me or caregility through epic) and verified that I am speaking with the correct person using two identifiers.  Location patient: [place] Location provider: [facility], office Persons participating in the virtual visit:  patient  Our team/I discussed the limitations of evaluation and management by telemedicine and the availability of in person appointments. In light of current covid-19 pandemic, patient also understands that we are trying to protect them by minimizing in office contact if at all possible.  The patient expressed consent for telemedicine visit and agreed to proceed. Patient understands insurance will be billed.   Past Medical History-  Patient Active Problem List   Diagnosis Date Noted  . GAD (generalized anxiety disorder) 07/06/2019  . Cannabis use disorder, mild, abuse 07/06/2019  . Alcohol use disorder, moderate, in early remission (HCC) 07/06/2019  . Headache   . H/O multiple concussions   . Dyslexia, developmental   . Short-term memory loss   . Major depressive disorder, recurrent episode, in partial remission (HCC) 12/21/2018  . Asthma 02/09/2017  . Anxiety and depression 07/30/2016  . PCP NOTES >>>>> 06/02/2015  . Annual physical exam 12/27/2011  .  ADD (attention  deficit disorder) 12/27/2011    Medications- reviewed and updated Current Outpatient Medications  Medication Sig Dispense Refill  . albuterol (VENTOLIN HFA) 108 (90 Base) MCG/ACT inhaler Inhale 1-2 puffs into the lungs every 6 (six) hours as needed for wheezing or shortness of breath. 18 g 1  . amoxicillin (AMOXIL) 500 MG capsule Take 1 capsule (500 mg total) by mouth 2 (two) times daily for 10 days. 20 capsule 0  . [START ON 03/31/2020] ARIPiprazole (ABILIFY) 5 MG tablet Take 1 tablet (5 mg total) by mouth daily. 30 tablet 2  . benzonatate (TESSALON) 100 MG capsule Take 1 capsule (100 mg total) by mouth every 8 (eight) hours. 21 capsule 0  . [START ON 03/31/2020] FLUoxetine (PROZAC) 40 MG capsule Take 1 capsule (40 mg total) by mouth daily. 90 capsule 0  . Levonorgestrel (KYLEENA) 19.5 MG IUD Kyleena 17.5 mcg/24 hrs (67yrs) 19.5mg  intrauterine device  Take 1 device by intrauterine route.    Marland Kitchen zolpidem (AMBIEN CR) 6.25 MG CR tablet Take 1 tablet (6.25 mg total) by mouth at bedtime as needed for sleep. 30 tablet 2   No current facility-administered medications for this visit.     Objective:  LMP 02/12/2020  no self reported vitals- does not have thermometer Gen: Appears fatigued-when palpates along cervical lymph nodes notes enlargement and tenderness Lungs: nonlabored, normal respiratory rate  Skin: appears dry, no obvious rash     Assessment and Plan   # Sore throat S:symptoms started about 3 days ago.  Minimal dry cough as well as white spots on tonsils. Sore throat is the worst part. Hurts to  swallow and feels more difficult to open mouth all the way. Has been a long time since having strep throat. Very slight wheezing- has not had to use albuterol yet. Has had subjective fever at night. Has had chills as well. Has not taken tylenol or ibuprofen yet.   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   Has not yet been tested for covid  19. Has been fully vaccinated from covid 19 back in April.  A/P: 24 year old female with sore throat, swollen lymph nodes, minimal cough, purulence on tonsils concerning for possible strep throat.  She would like to go ahead and empirically treat for strep throat-amoxicillin was sent in.  We did this since testing for COVID-19 can take several days and would delay treatment and she is in significant discomfort in her throat.  Recommended follow-up with PCP if fails to improve  Patient with symptoms concerning for potential covid 19 but she is fully vaccinated.  Therefore: -information provided on testing  - recommended patient watch closely for shortness of breath or confusion or worsening symptoms and if those occur he should contact us immediately  -recommended self isolation until negative test  at minimum .   Recommended follow up: As needed if fails to improve Future Appointments  Date Time Provider Department Center  04/16/2020 11:30 AM Pucilowski, Roosvelt Maser, MD [facility] None    Lab/Order associations:   ICD-10-CM   1. Sore throat  J02.9     Meds ordered this encounter  Medications  . amoxicillin (AMOXIL) 500 MG capsule    Sig: Take 1 capsule (500 mg total) by mouth 2 (two) times daily for 10 days.    Dispense:  20 capsule    Refill:  0   Return precautions advised.  Tana Conch, MD

## 2020-02-16 ENCOUNTER — Telehealth (INDEPENDENT_AMBULATORY_CARE_PROVIDER_SITE_OTHER): Payer: BC Managed Care – PPO | Admitting: Family Medicine

## 2020-02-16 ENCOUNTER — Encounter: Payer: Self-pay | Admitting: Family Medicine

## 2020-02-16 DIAGNOSIS — J029 Acute pharyngitis, unspecified: Secondary | ICD-10-CM | POA: Diagnosis not present

## 2020-02-16 DIAGNOSIS — Z20822 Contact with and (suspected) exposure to covid-19: Secondary | ICD-10-CM | POA: Diagnosis not present

## 2020-02-16 DIAGNOSIS — J020 Streptococcal pharyngitis: Secondary | ICD-10-CM | POA: Diagnosis not present

## 2020-02-16 MED ORDER — AMOXICILLIN 500 MG PO CAPS
500.0000 mg | ORAL_CAPSULE | Freq: Two times a day (BID) | ORAL | 0 refills | Status: AC
Start: 2020-02-16 — End: 2020-02-26

## 2020-03-28 DIAGNOSIS — F332 Major depressive disorder, recurrent severe without psychotic features: Secondary | ICD-10-CM | POA: Diagnosis not present

## 2020-04-04 DIAGNOSIS — F332 Major depressive disorder, recurrent severe without psychotic features: Secondary | ICD-10-CM | POA: Diagnosis not present

## 2020-04-11 DIAGNOSIS — F411 Generalized anxiety disorder: Secondary | ICD-10-CM | POA: Diagnosis not present

## 2020-04-16 ENCOUNTER — Other Ambulatory Visit: Payer: Self-pay

## 2020-04-16 ENCOUNTER — Telehealth (HOSPITAL_COMMUNITY): Payer: BC Managed Care – PPO | Admitting: Psychiatry

## 2020-04-23 ENCOUNTER — Other Ambulatory Visit: Payer: Self-pay

## 2020-04-23 ENCOUNTER — Encounter (HOSPITAL_COMMUNITY): Payer: Self-pay | Admitting: Psychiatric/Mental Health

## 2020-04-23 ENCOUNTER — Inpatient Hospital Stay (HOSPITAL_COMMUNITY)
Admission: RE | Admit: 2020-04-23 | Discharge: 2020-04-27 | DRG: 885 | Disposition: A | Discharge status: Home / Self Care | Payer: BC Managed Care – PPO | Attending: Psychiatry | Admitting: Psychiatry

## 2020-04-23 DIAGNOSIS — Z9151 Personal history of suicidal behavior: Secondary | ICD-10-CM | POA: Diagnosis not present

## 2020-04-23 DIAGNOSIS — F411 Generalized anxiety disorder: Secondary | ICD-10-CM | POA: Diagnosis present

## 2020-04-23 DIAGNOSIS — F1010 Alcohol abuse, uncomplicated: Secondary | ICD-10-CM | POA: Diagnosis present

## 2020-04-23 DIAGNOSIS — R45851 Suicidal ideations: Secondary | ICD-10-CM | POA: Diagnosis not present

## 2020-04-23 DIAGNOSIS — Z813 Family history of other psychoactive substance abuse and dependence: Secondary | ICD-10-CM

## 2020-04-23 DIAGNOSIS — Z20822 Contact with and (suspected) exposure to covid-19: Secondary | ICD-10-CM | POA: Diagnosis not present

## 2020-04-23 DIAGNOSIS — F332 Major depressive disorder, recurrent severe without psychotic features: Secondary | ICD-10-CM | POA: Diagnosis not present

## 2020-04-23 DIAGNOSIS — F1021 Alcohol dependence, in remission: Secondary | ICD-10-CM

## 2020-04-23 DIAGNOSIS — Z9152 Personal history of nonsuicidal self-harm: Secondary | ICD-10-CM

## 2020-04-23 DIAGNOSIS — R413 Other amnesia: Secondary | ICD-10-CM | POA: Diagnosis present

## 2020-04-23 DIAGNOSIS — Z975 Presence of (intrauterine) contraceptive device: Secondary | ICD-10-CM

## 2020-04-23 DIAGNOSIS — F909 Attention-deficit hyperactivity disorder, unspecified type: Secondary | ICD-10-CM | POA: Diagnosis not present

## 2020-04-23 LAB — RESPIRATORY PANEL BY RT PCR (FLU A&B, COVID)
Influenza A by PCR: NEGATIVE
Influenza B by PCR: NEGATIVE
SARS Coronavirus 2 by RT PCR: NEGATIVE

## 2020-04-23 MED ORDER — THIAMINE HCL 100 MG PO TABS
100.0000 mg | ORAL_TABLET | Freq: Every day | ORAL | Status: DC
  Administered 2020-04-24 – 2020-04-27 (×4): 100 mg via ORAL
  Filled 2020-04-23 (×7): qty 1

## 2020-04-23 MED ORDER — HYDROXYZINE HCL 25 MG PO TABS: 25.0000 mg | ORAL_TABLET | Freq: Three times a day (TID) | ORAL | Status: DC | PRN

## 2020-04-23 MED ORDER — MAGNESIUM HYDROXIDE 400 MG/5ML PO SUSP
30.0000 mL | Freq: Every day | ORAL | Status: DC | PRN
  Filled 2020-04-23: qty 30

## 2020-04-23 MED ORDER — ADULT MULTIVITAMIN W/MINERALS CH
1.0000 | ORAL_TABLET | Freq: Every day | ORAL | Status: DC
  Administered 2020-04-24 – 2020-04-27 (×4): 1 via ORAL
  Filled 2020-04-23 (×7): qty 1

## 2020-04-23 MED ORDER — ONDANSETRON 4 MG PO TBDP
4.0000 mg | ORAL_TABLET | Freq: Four times a day (QID) | ORAL | Status: AC | PRN
  Filled 2020-04-23: qty 1

## 2020-04-23 MED ORDER — FLUOXETINE HCL 20 MG PO CAPS
40.0000 mg | ORAL_CAPSULE | Freq: Every day | ORAL | Status: DC
  Administered 2020-04-24 – 2020-04-27 (×4): 40 mg via ORAL
  Filled 2020-04-23 (×7): qty 2

## 2020-04-23 MED ORDER — LORAZEPAM 1 MG PO TABS
1.0000 mg | ORAL_TABLET | Freq: Four times a day (QID) | ORAL | Status: AC | PRN
  Filled 2020-04-23: qty 1

## 2020-04-23 MED ORDER — ALUM & MAG HYDROXIDE-SIMETH 200-200-20 MG/5ML PO SUSP
30.0000 mL | ORAL | Status: DC | PRN
  Filled 2020-04-23: qty 30

## 2020-04-23 MED ORDER — ARIPIPRAZOLE 5 MG PO TABS
5.0000 mg | ORAL_TABLET | Freq: Every day | ORAL | Status: DC
  Administered 2020-04-24: 5 mg via ORAL
  Filled 2020-04-23 (×3): qty 1

## 2020-04-23 MED ORDER — ACETAMINOPHEN 325 MG PO TABS
650.0000 mg | ORAL_TABLET | Freq: Four times a day (QID) | ORAL | Status: DC | PRN
  Filled 2020-04-23: qty 2

## 2020-04-23 MED ORDER — HYDROXYZINE HCL 25 MG PO TABS
25.0000 mg | ORAL_TABLET | Freq: Four times a day (QID) | ORAL | Status: AC | PRN
  Administered 2020-04-23 – 2020-04-25 (×3): 25 mg via ORAL
  Filled 2020-04-23 (×4): qty 1

## 2020-04-23 MED ORDER — LOPERAMIDE HCL 2 MG PO CAPS
2.0000 mg | ORAL_CAPSULE | ORAL | Status: AC | PRN
  Filled 2020-04-23: qty 2

## 2020-04-23 MED ORDER — TRAZODONE HCL 50 MG PO TABS
50.0000 mg | ORAL_TABLET | Freq: Every evening | ORAL | Status: DC | PRN
  Administered 2020-04-23 – 2020-04-26 (×4): 50 mg via ORAL
  Filled 2020-04-23 (×5): qty 1

## 2020-04-23 NOTE — H&P (Signed)
Behavioral Health Medical Screening Exam  Tabitha Hawkins is an 24 y.o. female. She is presenting for suicidal ideation with onset three days ago, with plan to drive to the top of a parking garage and jump off. She is seen by Dr. Hinton Dyer for medication management and reports compliance with Prozac, Abilify, and Ambien. She has history of alcohol use disorder and reports drinking about 5 days a week, 10-12 beers per day, with last drink last night. She also reports daily marijuana use. Denies HI/AVH.  Total Time spent with patient: 15 minutes  Psychiatric Specialty Exam: Physical Exam Vitals and nursing note reviewed.  Constitutional:      Appearance: She is well-developed.  Cardiovascular:     Rate and Rhythm: Normal rate.  Pulmonary:     Effort: Pulmonary effort is normal.  Neurological:     Mental Status: She is alert and oriented to person, place, and time.    Review of Systems  Constitutional: Negative.   Respiratory: Negative for cough and shortness of breath.   Cardiovascular: Negative for chest pain.  Gastrointestinal: Negative for nausea and vomiting.  Genitourinary: Negative for pelvic pain.  Neurological: Negative for tremors, seizures, syncope and headaches.  Psychiatric/Behavioral: Positive for dysphoric mood, self-injury, sleep disturbance and suicidal ideas. Negative for agitation, behavioral problems, confusion, decreased concentration and hallucinations. The patient is nervous/anxious. The patient is not hyperactive.    Blood pressure 112/69, pulse (!) 56, temperature 98.4 F (36.9 C), temperature source Oral, resp. rate 18, SpO2 99 %.There is no height or weight on file to calculate BMI. General Appearance: Casual Eye Contact:  Good Speech:  Normal Rate Volume:  Normal Mood:  Depressed Affect:  Congruent Thought Process:  Coherent and Goal Directed Orientation:  Full (Time, Place, and Person) Thought Content:  Logical Suicidal Thoughts:  Yes.  with  intent/plan Homicidal Thoughts:  No Memory:  Immediate;   Good Recent;   Good Remote;   Good Judgement:  Impaired Insight:  Fair Psychomotor Activity:  Normal Concentration: Concentration: Fair and Attention Span: Fair Recall:  Good Fund of Knowledge:Fair Language: Good Akathisia:  No Handed:  Right AIMS (if indicated):    Assets:  Communication Skills Desire for Improvement Housing Resilience Sleep:     Musculoskeletal: Strength & Muscle Tone: within normal limits Gait & Station: normal Patient leans: N/A  Blood pressure 112/69, pulse (!) 56, temperature 98.4 F (36.9 C), temperature source Oral, resp. rate 18, SpO2 99 %.  Recommendations: Based on my evaluation the patient does not appear to have an emergency medical condition.  Inpatient hospitalization.  Aldean Baker, NP 04/23/2020, 2:42 PM

## 2020-04-23 NOTE — Progress Notes (Signed)
Psychoeducational Group Note  Date:  04/23/2020 Time:  2134  Group Topic/Focus:  Wrap-Up Group:   The focus of this group is to help patients review their daily goal of treatment and discuss progress on daily workbooks.  Participation Level: Did Not Attend  Participation Quality:  Not Applicable  Affect:  Not Applicable  Cognitive:  Not Applicable  Insight:  Not Applicable  Engagement in Group: Not Applicable  Additional Comments:  The patient did not attend group this evening.   Hazle Coca S 04/23/2020, 9:34 PM

## 2020-04-23 NOTE — Tx Team (Signed)
Initial Treatment Plan 04/23/2020 5:30 PM Tabitha Hawkins [id number]    PATIENT STRESSORS: Financial difficulties Occupational concerns   PATIENT STRENGTHS: Capable of independent living Wellsite geologist fund of knowledge Physical Health Supportive family/friends   PATIENT IDENTIFIED PROBLEMS: Depression  Suicidal ideation  "I want to feel safe in my own skin"  "Not be suicidal"                DISCHARGE CRITERIA:  Improved stabilization in mood, thinking, and/or behavior Motivation to continue treatment in a less acute level of care Need for constant or close observation no longer present Verbal commitment to aftercare and medication compliance  PRELIMINARY DISCHARGE PLAN: Outpatient therapy Medication management  PATIENT/FAMILY INVOLVEMENT: This treatment plan has been presented to and reviewed with the patient, Tabitha Hawkins.  The patient and family have been given the opportunity to ask questions and make suggestions.  Levin Bacon, RN 04/23/2020, 5:30 PM

## 2020-04-23 NOTE — Progress Notes (Signed)
Tabitha Hawkins is a 24 year old female being admitted voluntarily to [facility] as a walk in to [facility].  She is reporting suicidal ideation with plan to drive to the top of a parking garage and jump off. She has been seeing a psychiatrist and is prescribed Prozac, Abilify, and Ambien. She reports drinking 10-12 beers about 5 days this past two weeks.  Her last drink was last night. She denies history of alcohol withdrawal.  She does admit to using marijuana.  During [facility] admission, she was pleasant and cooperative.  Alert and oriented X 3.  She reported current stressors as money and work.  She continues to report suicidal thoughts but no plan and she will seek out staff if that changes.  She denies HI or A/V hallucinations.  She denies any pain or discomfort.  Admission paperwork completed and signed.  Oriented her to the unit and milieu.  Belongings searched and secured in locker #35.  No contraband noted.  Skin assessment completed and noted tattoo on her upper right side.  Q 15 minute checks initiated for safety.  Report given to [facility].  We will monitor the progress towards her goals.

## 2020-04-23 NOTE — BH Assessment (Addendum)
Assessment Note  Tabitha Hawkins is an 24 y.o. female. She has a history of ADHD, Major Depressive Disorder, and Anxiety. She presents to [facility] as a walk-in, voluntarily. She reports suicidal thoughts x3 days. She has experienced these thoughts in the past. Today, she is not able to contract for safety. Her suicide plan is to drive to the top of a parking garage and jump off. She has attempted suicide x2 (overdose & cutting). Her most recent suicide attempt was cutting her wrist, December 21, 2019. She was hospitalized at [facility] 4 days (December 21, 2019-December 25, 2019) for this suicide attempt. She does not recall the trigger for her past suicide attempts. Patient reports a history of self mutilating behaviors (cutting). She has been self mutilating for several yrs and last episode was 1 month ago. She reports depressive symptoms that include: Feeling angry/irritable, Feeling worthless/self pity, Loss of interest in usual pleasures, Fatigue, Isolating, Tearfulness. She sleeps 10-16 hrs per day and states this is normal for her. Her appetite is normal. Family history is unknown. Patient states that she is adopted. She currently lives with her boyfriend. Support system identified is her boyfriend and parents.   Patient denies HI. No history of aggressive and/or assaultive behaviors. She was charged with a DUI 5-6 months ago and has a court day 06/03/2020. Denies AVH's. Patient reports use of alcohol and THC. Last use of both substance was yesterday. She uses THC daily. Her alcohol use ranges from no use to days/weeks at a time to binges for days/weeks.  She receives medication management with Dr. Isaiah Serge. Her therapist is Peterson Lombard.   Patient is orient x4. Her speech is normal. Insight and judgement are fair. Impulse control is fair. Affect is sad and depressed. She is dressed in street clothing. Motor behavior is appropriate. She does not appear to be responding to internal stimuli.   Diagnosis: Major Depressive  Disorder, Recurrent, Severe, without psychotic features, Anxiety Disorder, ADD  Past Medical History:  Past Medical History:  Diagnosis Date  . ADHD (attention deficit hyperactivity disorder)   . H/O multiple concussions    seen by neuro  . Headache    frequent  . Short-term memory loss    cause from multiple concussions?    Past Surgical History:  Procedure Laterality Date  . BUNIONECTOMY    . KNEE ARTHROSCOPY Left 2016    Family History:  Family History  Adopted: Yes  Problem Relation Age of Onset  . Drug abuse Mother     Social History:  reports that she has never smoked. She has never used smokeless tobacco. She reports current alcohol use. She reports current drug use. Drug: Marijuana.  Additional Social History:  Alcohol / Drug Use Pain Medications: denies Prescriptions: see MAR Over the Counter: none History of alcohol / drug use?: Yes Longest period of sobriety (when/how long): 6 months (2019 around the time of foot surgery) Negative Consequences of Use: Personal relationships Withdrawal Symptoms: Fever / Chills, Irritability, Sweats, Nausea / Vomiting Substance #1 Name of Substance 1: Alcohol 1 - Age of First Use: 24 yrs old 1 - Amount (size/oz): in the past 2 weeks patient has drank 10-12 beers per day.....she reports drinking 5 out of the 7 days 1 - Frequency: varies; patint goes periods of time without drinking and then reports periods of binging 1 - Duration: on-going 1 - Last Use / Amount: 04/22/2020: #3 beers Substance #2 Name of Substance 2: THC 2 - Age of First Use: 17  2 - Amount (size/oz): 2 grams per day 2 - Frequency: daily 2 - Duration: years 2 - Last Use / Amount: 04/22/2020; 2 grams  CIWA: CIWA-Ar BP: 112/69 Pulse Rate: (!) 56 COWS:    Allergies: No Known Allergies  Home Medications:  Medications Prior to Admission  Medication Sig Dispense Refill  . albuterol (VENTOLIN HFA) 108 (90 Base) MCG/ACT inhaler Inhale 1-2 puffs into the lungs  every 6 (six) hours as needed for wheezing or shortness of breath. 18 g 1  . ARIPiprazole (ABILIFY) 5 MG tablet Take 1 tablet (5 mg total) by mouth daily. 30 tablet 2  . benzonatate (TESSALON) 100 MG capsule Take 1 capsule (100 mg total) by mouth every 8 (eight) hours. 21 capsule 0  . FLUoxetine (PROZAC) 40 MG capsule Take 1 capsule (40 mg total) by mouth daily. 90 capsule 0  . Levonorgestrel (KYLEENA) 19.5 MG IUD Kyleena 17.5 mcg/24 hrs (18yrs) 19.5mg  intrauterine device  Take 1 device by intrauterine route.    Marland Kitchen zolpidem (AMBIEN CR) 6.25 MG CR tablet Take 1 tablet (6.25 mg total) by mouth at bedtime as needed for sleep. 30 tablet 2    OB/GYN Status:  No LMP recorded. Patient has had an implant.  General Assessment Data Location of Assessment:  [facility]) TTS Assessment: In system Is this a Tele or Face-to-Face Assessment?: Face-to-Face Is this an Initial Assessment or a Re-assessment for this encounter?: Initial Assessment Patient Accompanied by::  (self referral ) Language Other than English: No Living Arrangements:  (with boyfriend ) What gender do you identify as?: Female Date Telepsych consult ordered in [facility]:  (n/a) Time Telepsych consult ordered in [facility]:  (n/a) Marital status: Single Maiden name:  Darlin Drop ) Pregnancy Status: No Living Arrangements: Non-relatives/Friends (with boyfriend ) Can pt return to current living arrangement?: Yes Admission Status: Voluntary Is patient capable of signing voluntary admission?: Yes Referral Source: Self/Family/Friend Insurance type:  Herbalist)  Medical Screening Exam [facility] Walk-in ONLY) Medical Exam completed: Yes Richardean Chimera)  Crisis Care Plan Living Arrangements: Non-relatives/Friends (with boyfriend ) Legal Guardian:  (no legal guardian ) Name of Psychiatrist:  (Dr. Cottie Banda) Name of Therapist:  Peterson Lombard )     Risk to self with the past 6 months Suicidal Ideation: Yes-Currently Present Has patient been a risk to self within  the past 6 months prior to admission? : Yes Suicidal Intent: Yes-Currently Present Has patient had any suicidal intent within the past 6 months prior to admission? : Yes Is patient at risk for suicide?: Yes Suicidal Plan?: Yes-Currently Present Has patient had any suicidal plan within the past 6 months prior to admission? : Yes Specify Current Suicidal Plan:  (drive to the top of parking garage and jump off ) Access to Means: Yes Specify Access to Suicidal Means:  (access to parking garages ) What has been your use of drugs/alcohol within the last 12 months?:  (patient reports alcohol and THC use ) Previous Attempts/Gestures: Yes How many times?:  (2x's- overdose and cutting ) Other Self Harm Risks:  (yes-cutting ) Triggers for Past Attempts:  ("I don't know") Intentional Self Injurious Behavior: None Family Suicide History: No Recent stressful life event(s): Financial Problems Persecutory voices/beliefs?: No Depression: Yes Depression Symptoms: Feeling angry/irritable, Feeling worthless/self pity, Loss of interest in usual pleasures, Fatigue, Isolating, Tearfulness Substance abuse history and/or treatment for substance abuse?: No Suicide prevention information given to non-admitted patients: Not applicable  Risk to Others within the past 6 months Homicidal Ideation: No Does patient have  any lifetime risk of violence toward others beyond the six months prior to admission? : No Thoughts of Harm to Others: No Current Homicidal Intent: No Current Homicidal Plan: No Access to Homicidal Means: No Identified Victim:  (n/a) History of harm to others?: No Assessment of Violence: None Noted Violent Behavior Description:  (patient is calm and cooperative ) Does patient have access to weapons?: No Criminal Charges Pending?: No Does patient have a court date: No Is patient on probation?: No  Psychosis Hallucinations: None noted Delusions: None noted  Mental Status  Report Appearance/Hygiene: Disheveled Eye Contact: Fair Motor Activity: Freedom of movement Speech: Logical/coherent Level of Consciousness: Alert Mood: Depressed, Sad Affect: Sad, Depressed Anxiety Level: Severe Thought Processes: Coherent, Relevant Judgement: Impaired Orientation: Person, Place, Time, Situation Obsessive Compulsive Thoughts/Behaviors: None  Cognitive Functioning Concentration: Normal Memory: Recent Intact, Remote Intact Is patient IDD: No Insight: Fair Impulse Control: Fair Appetite: Good Have you had any weight changes? : No Change Sleep:  (10-16 per day ) Total Hours of Sleep:  (10-16 hrs per day ) Vegetative Symptoms: Staying in bed  ADLScreening [facility] Assessment Services) Patient's cognitive ability adequate to safely complete daily activities?: Yes Patient able to express need for assistance with ADLs?: Yes Independently performs ADLs?: Yes (appropriate for developmental age)  Prior Inpatient Therapy Prior Inpatient Therapy: Yes Prior Therapy Dates:  (December 20, 2017-December 24, 2017) Prior Therapy Facilty/Provider(s):  [facility]) Reason for Treatment:  (suicide attempt by cuttin wrist )  Prior Outpatient Therapy Prior Outpatient Therapy: Yes Prior Therapy Dates:  (current ) Prior Therapy Facilty/Provider(s):  (psychiatrist-Dr. Puculowski & therapist-[name] Ty Hilts ) Reason for Treatment:  (depression/anxiety; med management ) Does patient have an ACCT team?: No Does patient have Intensive In-House Services?  : No Does patient have [facility] services? : No Does patient have P4CC services?: No  ADL Screening (condition at time of admission) Patient's cognitive ability adequate to safely complete daily activities?: Yes Patient able to express need for assistance with ADLs?: Yes Independently performs ADLs?: Yes (appropriate for developmental age)                        Disposition: Per Marciano Sequin, NP, patient meets criteria for inpatient  treatment. Patient to be admitted to [facility].  Disposition Initial Assessment Completed for this Encounter: Yes  On Site Evaluation by:   Reviewed with Physician:    Melynda Ripple 04/23/2020 3:52 PM

## 2020-04-23 NOTE — Progress Notes (Signed)
   04/23/20 2020  COVID-19 Daily Checkoff  Have you had a fever (temp > 37.80C/100F)  in the past 24 hours?  No  COVID-19 EXPOSURE  Have you traveled outside the state in the past 14 days? No  Have you been in contact with someone with a confirmed diagnosis of COVID-19 or PUI in the past 14 days without wearing appropriate PPE? No  Have you been living in the same home as a person with confirmed diagnosis of COVID-19 or a PUI (household contact)? No  Have you been diagnosed with COVID-19? No

## 2020-04-24 DIAGNOSIS — F332 Major depressive disorder, recurrent severe without psychotic features: Principal | ICD-10-CM

## 2020-04-24 LAB — URINALYSIS, ROUTINE W REFLEX MICROSCOPIC
Bilirubin Urine: NEGATIVE
Glucose, UA: NEGATIVE mg/dL
Hgb urine dipstick: NEGATIVE
Ketones, ur: NEGATIVE mg/dL
Leukocytes,Ua: NEGATIVE
Nitrite: NEGATIVE
Protein, ur: NEGATIVE mg/dL
Specific Gravity, Urine: 1.013 (ref 1.005–1.030)
pH: 6 (ref 5.0–8.0)

## 2020-04-24 LAB — ETHANOL: Alcohol, Ethyl (B): <10 mg/dL (ref <10)

## 2020-04-24 LAB — CBC
HCT: 43.6 % (ref 36.0–46.0)
Hemoglobin: 14.8 g/dL (ref 12.0–15.0)
MCH: 32.7 pg (ref 26.0–34.0)
MCHC: 33.9 g/dL (ref 30.0–36.0)
MCV: 96.5 fL (ref 80.0–100.0)
Platelets: 230 10*3/uL (ref 150–400)
RBC: 4.52 MIL/uL (ref 3.87–5.11)
RDW: 12.7 % (ref 11.5–15.5)
WBC: 7.7 10*3/uL (ref 4.0–10.5)
nRBC: 0 % (ref 0.0–0.2)

## 2020-04-24 LAB — COMPREHENSIVE METABOLIC PANEL
ALT: 12 U/L (ref 0–44)
AST: 15 U/L (ref 15–41)
Albumin: 4 g/dL (ref 3.5–5.0)
Alkaline Phosphatase: 56 U/L (ref 38–126)
Anion gap: 11 (ref 5–15)
BUN: 14 mg/dL (ref 6–20)
CO2: 18 mmol/L — ABNORMAL LOW (ref 22–32)
Calcium: 8.8 mg/dL — ABNORMAL LOW (ref 8.9–10.3)
Chloride: 106 mmol/L (ref 98–111)
Creatinine, Ser: 0.75 mg/dL (ref 0.44–1.00)
GFR, Estimated: >60 mL/min (ref >60)
Glucose, Bld: 74 mg/dL (ref 70–99)
Potassium: 3.8 mmol/L (ref 3.5–5.1)
Sodium: 135 mmol/L (ref 135–145)
Total Bilirubin: 1.3 mg/dL — ABNORMAL HIGH (ref 0.3–1.2)
Total Protein: 7.3 g/dL (ref 6.5–8.1)

## 2020-04-24 LAB — RAPID URINE DRUG SCREEN, HOSP PERFORMED
Amphetamines: NOT DETECTED
Barbiturates: NOT DETECTED
Benzodiazepines: NOT DETECTED
Cocaine: NOT DETECTED
Opiates: NOT DETECTED
Tetrahydrocannabinol: POSITIVE — AB

## 2020-04-24 LAB — LIPID PANEL
Cholesterol: 202 mg/dL — ABNORMAL HIGH (ref 0–200)
HDL: 98 mg/dL (ref >40)
LDL Cholesterol: 87 mg/dL (ref 0–99)
Total CHOL/HDL Ratio: 2.1 RATIO
Triglycerides: 83 mg/dL (ref <150)
VLDL: 17 mg/dL (ref 0–40)

## 2020-04-24 LAB — PREGNANCY, URINE: Preg Test, Ur: NEGATIVE

## 2020-04-24 LAB — HEMOGLOBIN A1C
Hgb A1c MFr Bld: 4.6 % — ABNORMAL LOW (ref 4.8–5.6)
Mean Plasma Glucose: 85.32 mg/dL

## 2020-04-24 LAB — TSH: TSH: 2.978 u[IU]/mL (ref 0.350–4.500)

## 2020-04-24 MED ORDER — ARIPIPRAZOLE 15 MG PO TABS
7.5000 mg | ORAL_TABLET | Freq: Every day | ORAL | Status: DC
  Administered 2020-04-25: 7.5 mg via ORAL
  Filled 2020-04-24 (×3): qty 1

## 2020-04-24 MED ORDER — ARIPIPRAZOLE 15 MG PO TABS
7.5000 mg | ORAL_TABLET | Freq: Every day | ORAL | Status: DC
  Filled 2020-04-24: qty 1

## 2020-04-24 NOTE — Progress Notes (Addendum)
   04/24/20 0613  Vital Signs  Temp 97.9 F (36.6 C)  Temp Source Oral  Pulse Rate 64  BP 112/79  BP Location Right Arm  BP Method Automatic  Patient Position (if appropriate) Sitting   D:  Patient denies SI/HI/AVH. Patient rates anxiety 3/10 and depression 4/10. Pt. Was out in open areas briefly, but spent much of this shift in her room in her bed. Pt. Did turn in her suicide safety plan. A:  Patient took scheduled medicine.  Support and encouragement provided Routine safety checks conducted every 15 minutes. Patient  Informed to notify staff with any concerns.  R:  Safety maintained.  1857 Late Note:  Signed a 72 hour on 04/24/20 @ 1853, due on 04/27/20 @ 1853.

## 2020-04-24 NOTE — Progress Notes (Signed)
   04/24/20 1945  COVID-19 Daily Checkoff  Have you had a fever (temp > 37.80C/100F)  in the past 24 hours?  No  COVID-19 EXPOSURE  Have you traveled outside the state in the past 14 days? No  Have you been in contact with someone with a confirmed diagnosis of COVID-19 or PUI in the past 14 days without wearing appropriate PPE? No  Have you been living in the same home as a person with confirmed diagnosis of COVID-19 or a PUI (household contact)? No  Have you been diagnosed with COVID-19? No

## 2020-04-24 NOTE — Progress Notes (Signed)
Pt had some questions regarding her 72 hour discharge form that were discussed. Pt said that she knows that she isn't quite ready to be discharged yet, but just misses her boyfriend and family. She is still working on feeling safe in her skin. She has been out in open areas, she attended group tonight, and was present in the dayroom. She is minimal, doesn't further much about her stressors. Pt denies SI/HI and AVH.Active listening, reassurance, and support provided. Medications administered as ordered by MD. Q 15 min safety checks continue. Pt's safety has been maintained.   04/24/20 1945  Psych Admission Type (Psych Patients Only)  Admission Status Voluntary  Psychosocial Assessment  Patient Complaints Anxiety;Depression;Sadness  Eye Contact Fair  Facial Expression Other (Comment) (appropriate)  Affect Appropriate to circumstance  Speech Logical/coherent  Interaction Guarded;Forwards little  Motor Activity Other (Comment) (WNL)  Appearance/Hygiene Unremarkable  Behavior Characteristics Cooperative;Calm;Anxious  Mood Depressed;Anxious;Pleasant;Sad  Thought Process  Coherency WDL  Content WDL  Delusions None reported or observed  Perception WDL  Hallucination None reported or observed  Judgment Poor  Confusion None  Danger to Self  Current suicidal ideation? Denies  Self-Injurious Behavior No self-injurious ideation or behavior indicators observed or expressed   Agreement Not to Harm Self Yes  Description of Agreement verbally contracts for safety  Danger to Others  Danger to Others None reported or observed

## 2020-04-24 NOTE — H&P (Signed)
Psychiatric Admission Assessment Adult  Patient Identification: Tabitha Hawkins MRN:  [id number] Date of Evaluation:  04/24/2020 Chief Complaint:  MDD (major depressive disorder), recurrent episode, severe (HCC) [F33.2] Principal Diagnosis: <principal problem not specified> Diagnosis:  Active Problems:   MDD (major depressive disorder), recurrent episode, severe (HCC)  History of Present Illness:  Tabitha Hawkins is a 24 yr old female who presents voluntarily for suicidal ideation, has a PPHx of 2 prior suicide attempts (overdose and cutting)(last attempt in July 2021 by cutting wrist), Self Mutilation (cutting), EtOH use disorder, MDD, Anxiety Disorder, and ADHD.   She reports that 3 days prior to admission she began having thoughts of jumping off of a parking deck. She reports that she did not go through with it due to an old friend calling her out of the blue. She reports she then called her mother and talked about it her plans and then presented for help. She receives medication management from Dr. Isaiah Serge- Prozac, Abilify, Ambien. She reports that she has been on these since 2019. She reports that they have helped in the past but due to financial stresses her symptoms have become less controlled. She reports being able to get her medication and taking it regularly. She reports she is seen every 2 months but missed her last appointment so has not been seen for ~4 months.  She reports that she has been decreasing the amount of EtOH she has been using. Reports she has been planning to stop use altogether. She reports her and her boyfriend had already discussed stopping all use and she thinks she will stop [place] on discharge from facility.  She reports that this morning she does not have any SI. She reports that she was able to get a good nights sleep. Encouraged going to group sessions.  Associated Signs/Symptoms: Depression Symptoms:  depressed mood, anhedonia, Duration of Depression Symptoms:  No data recorded (Hypo) Manic Symptoms:  None Anxiety Symptoms:  None Psychotic Symptoms:  None Duration of Psychotic Symptoms: No data recorded PTSD Symptoms: NA Total Time spent with patient: 30 minutes  Past Psychiatric History: 2 prior suicide attempts (overdose and cutting)(last attempt in July 2021 by cutting wrist), Self Mutilation (cutting), EtOH use disorder MDD, Anxiety Disorder, and ADHD.  Is the patient at risk to self? Yes.    Has the patient been a risk to self in the past 6 months? Yes.    Has the patient been a risk to self within the distant past? Yes.    Is the patient a risk to others? No.  Has the patient been a risk to others in the past 6 months? No.  Has the patient been a risk to others within the distant past? No.   Prior Inpatient Therapy: Prior Inpatient Therapy: Yes Prior Therapy Dates:  (December 20, 2017-December 24, 2017) Prior Therapy Facilty/Provider(s):  [facility]) Reason for Treatment:  (suicide attempt by cuttin wrist ) Prior Outpatient Therapy: Prior Outpatient Therapy: Yes Prior Therapy Dates:  (current ) Prior Therapy Facilty/Provider(s):  (psychiatrist-Dr. Puculowski & therapist-[name] Ty Hilts ) Reason for Treatment:  (depression/anxiety; med management ) Does patient have an ACCT team?: No Does patient have Intensive In-House Services?  : No Does patient have [facility] services? : No Does patient have P4CC services?: No  Alcohol Screening: 1. How often do you have a drink containing alcohol?: 2 to 4 times a month 2. How many drinks containing alcohol do you have on a typical day when you are drinking?: 5 or 6 3.  How often do you have six or more drinks on one occasion?: Less than monthly AUDIT-C Score: 5 4. How often during the last year have you found that you were not able to stop drinking once you had started?: Never 5. How often during the last year have you failed to do what was normally expected from you because of drinking?: Never 6. How often during  the last year have you needed a first drink in the morning to get yourself going after a heavy drinking session?: Never 7. How often during the last year have you had a feeling of guilt of remorse after drinking?: Never 8. How often during the last year have you been unable to remember what happened the night before because you had been drinking?: Never 9. Have you or someone else been injured as a result of your drinking?: No 10. Has a relative or friend or a doctor or another health worker been concerned about your drinking or suggested you cut down?: No Alcohol Use Disorder Identification Test Final Score (AUDIT): 5 Alcohol Brief Interventions/Follow-up: Patient Refused Substance Abuse History in the last 12 months:  Yes.   Consequences of Substance Abuse: Legal Consequences:  Has had multiple DUI's with court appearance upcoming in December. Previous Psychotropic Medications: Yes  Psychological Evaluations: Yes  Past Medical History:  Past Medical History:  Diagnosis Date  . ADHD (attention deficit hyperactivity disorder)   . H/O multiple concussions    seen by neuro  . Headache    frequent  . Short-term memory loss    cause from multiple concussions?    Past Surgical History:  Procedure Laterality Date  . BUNIONECTOMY    . KNEE ARTHROSCOPY Left 2016   Family History:  Family History  Adopted: Yes  Problem Relation Age of Onset  . Drug abuse Mother    Family Psychiatric  History: Mother has substance abuse history. Pt is adopted so does not know full history. Tobacco Screening: Have you used any form of tobacco in the last 30 days? (Cigarettes, Smokeless Tobacco, Cigars, and/or Pipes): No Social History:  Social History   Substance and Sexual Activity  Alcohol Use Yes   Comment: occasionally     Social History   Substance and Sexual Activity  Drug Use Yes  . Types: Marijuana    Additional Social History: Marital status: Single    Pain Medications:  denies Prescriptions: see MAR Over the Counter: none History of alcohol / drug use?: Yes Longest period of sobriety (when/how long): 6 months (2019 around the time of foot surgery) Negative Consequences of Use: Personal relationships Withdrawal Symptoms: Fever / Chills, Irritability, Sweats, Nausea / Vomiting Name of Substance 1: Alcohol 1 - Age of First Use: 24 yrs old 1 - Amount (size/oz): in the past 2 weeks patient has drank 10-12 beers per day.....she reports drinking 5 out of the 7 days 1 - Frequency: varies; patint goes periods of time without drinking and then reports periods of binging 1 - Duration: on-going 1 - Last Use / Amount: 04/22/2020: #3 beers Name of Substance 2: THC 2 - Age of First Use: 17 2 - Amount (size/oz): 2 grams per day 2 - Frequency: daily 2 - Duration: years 2 - Last Use / Amount: 04/22/2020; 2 grams                Allergies:  No Known Allergies Lab Results:  Results for orders placed or performed during the hospital encounter of 04/23/20 (from the past 48  hour(s))  Respiratory Panel by RT PCR (Flu A&B, Covid) - Nasopharyngeal Swab     Status: None   Collection Time: 04/23/20  2:57 PM   Specimen: Nasopharyngeal Swab  Result Value Ref Range   SARS Coronavirus 2 by RT PCR NEGATIVE NEGATIVE    Comment: (NOTE) SARS-CoV-2 target nucleic acids are NOT DETECTED.  The SARS-CoV-2 RNA is generally detectable in upper respiratoy specimens during the acute phase of infection. The lowest concentration of SARS-CoV-2 viral copies this assay can detect is 131 copies/mL. A negative result does not preclude SARS-Cov-2 infection and should not be used as the sole basis for treatment or other patient management decisions. A negative result may occur with  improper specimen collection/handling, submission of specimen other than nasopharyngeal swab, presence of viral mutation(s) within the areas targeted by this assay, and inadequate number of viral copies (<131  copies/mL). A negative result must be combined with clinical observations, patient history, and epidemiological information. The expected result is Negative.  Fact Sheet for Patients:  https://www.moore.com/  Fact Sheet for Healthcare Providers:  https://www.young.biz/  This test is no t yet approved or cleared by the [place] FDA and  has been authorized for detection and/or diagnosis of SARS-CoV-2 by FDA under an Emergency Use Authorization (EUA). This EUA will remain  in effect (meaning this test can be used) for the duration of the COVID-19 declaration under Section 564(b)(1) of the Act, 21 U.S.C. section 360bbb-3(b)(1), unless the authorization is terminated or revoked sooner.     Influenza A by PCR NEGATIVE NEGATIVE   Influenza B by PCR NEGATIVE NEGATIVE    Comment: (NOTE) The Xpert Xpress SARS-CoV-2/FLU/RSV assay is intended as an aid in  the diagnosis of influenza from Nasopharyngeal swab specimens and  should not be used as a sole basis for treatment. Nasal washings and  aspirates are unacceptable for Xpert Xpress SARS-CoV-2/FLU/RSV  testing.  Fact Sheet for Patients: https://www.moore.com/  Fact Sheet for Healthcare Providers: https://www.young.biz/  This test is not yet approved or cleared by the [place] FDA and  has been authorized for detection and/or diagnosis of SARS-CoV-2 by  FDA under an Emergency Use Authorization (EUA). This EUA will remain  in effect (meaning this test can be used) for the duration of the  Covid-19 declaration under Section 564(b)(1) of the Act, 21  U.S.C. section 360bbb-3(b)(1), unless the authorization is  terminated or revoked. Performed at [facility], [address], [place], [place] [address]   Pregnancy, urine     Status: None   Collection Time: 04/24/20  6:12 AM  Result Value Ref Range   Preg Test, Ur NEGATIVE NEGATIVE     Comment:        THE SENSITIVITY OF THIS METHODOLOGY IS >20 mIU/mL. Performed at [facility], [address] [place], [place] [address]   Urinalysis, Routine w reflex microscopic Urine, Clean Catch     Status: None   Collection Time: 04/24/20  6:12 AM  Result Value Ref Range   Color, Urine YELLOW YELLOW   APPearance CLEAR CLEAR   Specific Gravity, Urine 1.013 1.005 - 1.030   pH 6.0 5.0 - 8.0   Glucose, UA NEGATIVE NEGATIVE mg/dL   Hgb urine dipstick NEGATIVE NEGATIVE   Bilirubin Urine NEGATIVE NEGATIVE   Ketones, ur NEGATIVE NEGATIVE mg/dL   Protein, ur NEGATIVE NEGATIVE mg/dL   Nitrite NEGATIVE NEGATIVE   Leukocytes,Ua NEGATIVE NEGATIVE    Comment: Performed at [facility], [address] [place], [place] [address]  Urine rapid drug screen (hosp performed)not at [facility]     Status: Abnormal   Collection Time: 04/24/20  6:12 AM  Result Value Ref Range   Opiates NONE DETECTED NONE DETECTED   Cocaine NONE DETECTED NONE DETECTED   Benzodiazepines NONE DETECTED NONE DETECTED   Amphetamines NONE DETECTED NONE DETECTED   Tetrahydrocannabinol POSITIVE (A) NONE DETECTED   Barbiturates NONE DETECTED NONE DETECTED    Comment: (NOTE) DRUG SCREEN FOR MEDICAL PURPOSES ONLY.  IF CONFIRMATION IS NEEDED FOR ANY PURPOSE, NOTIFY LAB WITHIN 5 DAYS.  LOWEST DETECTABLE LIMITS FOR URINE DRUG SCREEN Drug Class                     Cutoff (ng/mL) Amphetamine and metabolites    1000 Barbiturate and metabolites    200 Benzodiazepine                 200 Tricyclics and metabolites     300 Opiates and metabolites        300 Cocaine and metabolites        300 THC                            50 Performed at [facility], [address] [place], [place] [address]   CBC     Status: None   Collection Time: 04/24/20  6:48 AM  Result Value Ref Range   WBC 7.7 4.0 - 10.5 K/uL   RBC 4.52 3.87 - 5.11 MIL/uL   Hemoglobin 14.8 12.0 - 15.0 g/dL    HCT 09.8 36 - 46 %   MCV 96.5 80.0 - 100.0 fL   MCH 32.7 26.0 - 34.0 pg   MCHC 33.9 30.0 - 36.0 g/dL   RDW 11.9 14.7 - 82.9 %   Platelets 230 150 - 400 K/uL   nRBC 0.0 0.0 - 0.2 %    Comment: Performed at [facility], [address] [place], [place] [address]  Comprehensive metabolic panel     Status: Abnormal   Collection Time: 04/24/20  6:48 AM  Result Value Ref Range   Sodium 135 135 - 145 mmol/L   Potassium 3.8 3.5 - 5.1 mmol/L   Chloride 106 98 - 111 mmol/L   CO2 18 (L) 22 - 32 mmol/L   Glucose, Bld 74 70 - 99 mg/dL    Comment: Glucose reference range applies only to samples taken after fasting for at least 8 hours.   BUN 14 6 - 20 mg/dL   Creatinine, Ser 0.86 0.44 - 1.00 mg/dL   Calcium 8.8 (L) 8.9 - 10.3 mg/dL   Total Protein 7.3 6.5 - 8.1 g/dL   Albumin 4.0 3.5 - 5.0 g/dL   AST 15 15 - 41 U/L   ALT 12 0 - 44 U/L   Alkaline Phosphatase 56 38 - 126 U/L   Total Bilirubin 1.3 (H) 0.3 - 1.2 mg/dL   GFR, Estimated >57 >84 mL/min    Comment: (NOTE) Calculated using the CKD-EPI Creatinine Equation (2021)    Anion gap 11 5 - 15    Comment: Performed at [facility], [address], [place], [place] [address]  Hemoglobin A1c     Status: Abnormal   Collection Time: 04/24/20  6:48 AM  Result Value Ref Range   Hgb A1c MFr Bld 4.6 (L) 4.8 - 5.6 %    Comment: (  NOTE) Pre diabetes:          5.7%-6.4%  Diabetes:              >6.4%  Glycemic control for   <7.0% adults with diabetes    Mean Plasma Glucose 85.32 mg/dL    Comment: Performed at [facility], [address] [place], [place] [address]  Ethanol     Status: None   Collection Time: 04/24/20  6:48 AM  Result Value Ref Range   Alcohol, Ethyl (B) <10 <10 mg/dL    Comment: (NOTE) Lowest detectable limit for serum alcohol is 10 mg/dL.  For medical purposes only. Performed at [facility], [address] [place], [place] [address]   Lipid panel      Status: Abnormal   Collection Time: 04/24/20  6:48 AM  Result Value Ref Range   Cholesterol 202 (H) 0 - 200 mg/dL   Triglycerides 83 <098 mg/dL   HDL 98 >11 mg/dL   Total CHOL/HDL Ratio 2.1 RATIO   VLDL 17 0 - 40 mg/dL   LDL Cholesterol 87 0 - 99 mg/dL    Comment:        Total Cholesterol/HDL:CHD Risk Coronary Heart Disease Risk Table                     Men   Women  1/2 Average Risk   3.4   3.3  Average Risk       5.0   4.4  2 X Average Risk   9.6   7.1  3 X Average Risk  23.4   11.0        Use the calculated Patient Ratio above and the CHD Risk Table to determine the patient's CHD Risk.        ATP III CLASSIFICATION (LDL):  <100     mg/dL   Optimal  914-782  mg/dL   Near or Above                    Optimal  130-159  mg/dL   Borderline  956-213  mg/dL   High  >086     mg/dL   Very High Performed at [facility], [address] [place], [place] [address]   TSH     Status: None   Collection Time: 04/24/20  6:48 AM  Result Value Ref Range   TSH 2.978 0.350 - 4.500 uIU/mL    Comment: Performed by a 3rd Generation assay with a functional sensitivity of <=0.01 uIU/mL. Performed at [facility], [address], [place], [place] [address]     Blood Alcohol level:  Lab Results  Component Value Date   ETH <10 04/24/2020   ETH 173 (H) 12/21/2018    Metabolic Disorder Labs:  Lab Results  Component Value Date   HGBA1C 4.6 (L) 04/24/2020   MPG 85.32 04/24/2020   MPG 76.71 12/22/2018   No results found for: PROLACTIN Lab Results  Component Value Date   CHOL 202 (H) 04/24/2020   TRIG 83 04/24/2020   HDL 98 04/24/2020   CHOLHDL 2.1 04/24/2020   VLDL 17 04/24/2020   LDLCALC 87 04/24/2020   LDLCALC 83 12/22/2018    Current Medications: Current Facility-Administered Medications  Medication Dose Route Frequency Provider Last Rate Last Admin  . acetaminophen (TYLENOL) tablet 650 mg  650 mg Oral Q6H PRN Aldean Baker, NP      .  alum &  mag hydroxide-simeth (MAALOX/MYLANTA) 200-200-20 MG/5ML suspension 30 mL  30 mL Oral Q4H PRN Aldean Baker, NP      . ARIPiprazole (ABILIFY) tablet 5 mg  5 mg Oral Daily Aldean Baker, NP   5 mg at 04/24/20 0957  . FLUoxetine (PROZAC) capsule 40 mg  40 mg Oral Daily Aldean Baker, NP   40 mg at 04/24/20 0957  . hydrOXYzine (ATARAX/VISTARIL) tablet 25 mg  25 mg Oral Q6H PRN Aldean Baker, NP   25 mg at 04/23/20 2118  . loperamide (IMODIUM) capsule 2-4 mg  2-4 mg Oral PRN Aldean Baker, NP      . LORazepam (ATIVAN) tablet 1 mg  1 mg Oral Q6H PRN Aldean Baker, NP      . magnesium hydroxide (MILK OF MAGNESIA) suspension 30 mL  30 mL Oral Daily PRN Aldean Baker, NP      . multivitamin with minerals tablet 1 tablet  1 tablet Oral Daily Aldean Baker, NP   1 tablet at 04/24/20 0957  . ondansetron (ZOFRAN-ODT) disintegrating tablet 4 mg  4 mg Oral Q6H PRN Aldean Baker, NP      . thiamine tablet 100 mg  100 mg Oral Daily Aldean Baker, NP   100 mg at 04/24/20 0957  . traZODone (DESYREL) tablet 50 mg  50 mg Oral QHS PRN Aldean Baker, NP   50 mg at 04/23/20 2119   PTA Medications: Medications Prior to Admission  Medication Sig Dispense Refill Last Dose  . ARIPiprazole (ABILIFY) 5 MG tablet Take 1 tablet (5 mg total) by mouth daily. 30 tablet 2   . Levonorgestrel (KYLEENA) 19.5 MG IUD Kyleena 17.5 mcg/24 hrs (83yrs) 19.5mg  intrauterine device  Take 1 device by intrauterine route.     Marland Kitchen zolpidem (AMBIEN CR) 6.25 MG CR tablet Take 1 tablet (6.25 mg total) by mouth at bedtime as needed for sleep. 30 tablet 2   . FLUoxetine (PROZAC) 40 MG capsule Take 1 capsule (40 mg total) by mouth daily. (Patient taking differently: Take 40 mg by mouth at bedtime. ) 90 capsule 0     Musculoskeletal: Strength & Muscle Tone: within normal limits Gait & Station: normal Patient leans: N/A  Psychiatric Specialty Exam: Physical Exam Vitals and nursing note reviewed.  Constitutional:      General: She  is not in acute distress.    Appearance: Normal appearance. She is normal weight. She is not ill-appearing, toxic-appearing or diaphoretic.  HENT:     Head: Normocephalic and atraumatic.  Eyes:     Extraocular Movements: Extraocular movements intact.  Cardiovascular:     Rate and Rhythm: Normal rate.  Pulmonary:     Effort: Pulmonary effort is normal.  Musculoskeletal:        General: Normal range of motion.  Neurological:     General: No focal deficit present.     Mental Status: She is alert.     Review of Systems  Constitutional: Negative for fatigue and fever.  Respiratory: Negative for chest tightness and shortness of breath.   Cardiovascular: Negative for chest pain and palpitations.  Gastrointestinal: Negative for abdominal pain, constipation, diarrhea, nausea and vomiting.  Neurological: Negative for dizziness and headaches.  Psychiatric/Behavioral: Negative for agitation, hallucinations, self-injury and suicidal ideas. The patient is not nervous/anxious.     Blood pressure 109/68, pulse 80, temperature 97.9 F (36.6 C), temperature source Oral, resp. rate 18, height  (1.753 m), weight 81.6 kg, SpO2  99 %.Body mass index is 26.58 kg/m.  General Appearance: Fairly Groomed  Eye Contact:  Good  Speech:  Clear and Coherent and Normal Rate  Volume:  Normal  Mood:  Appropriate  Affect:  Appropriate  Thought Process:  Coherent  Orientation:  Full (Time, Place, and Person)  Thought Content:  Logical  Suicidal Thoughts:  No  Homicidal Thoughts:  No  Memory:  Immediate;   Good Recent;   Good  Judgement:  Good  Insight:  Good  Psychomotor Activity:  Normal  Concentration:  Concentration: Good  Recall:  Good  Fund of Knowledge:  Good  Language:  Good  Akathisia:  NA  Handed:  Right  AIMS (if indicated):     Assets:  Communication Skills Desire for Improvement Resilience  ADL's:  Intact  Cognition:  WNL  Sleep:  Number of Hours: 6.75    Treatment Plan  Summary: Currently on Prozac 40 mg and Abilify 5 mg daily. These medications have kept her stable in the past. Will plan to increase Abilify to 7.5 mg tomorrow. Will inquire more into her EtOH use and continue to monitor for withdrawal with CIWA.   Observation Level/Precautions:  15 minute checks  Laboratory:  UDS: THC  Lipid panel: elevated Cholesterol  THS:WNL A1C:WNL  Psychotherapy:    Medications:    Consultations:    Discharge Concerns:   Estimated LOS: 2-4 days  Other:     Physician Treatment Plan for Primary Diagnosis: <principal problem not specified> Long Term Goal(s): Improvement in symptoms so as ready for discharge  Short Term Goals: Ability to identify changes in lifestyle to reduce recurrence of condition will improve and Ability to disclose and discuss suicidal ideas  Physician Treatment Plan for Secondary Diagnosis: Active Problems:   MDD (major depressive disorder), recurrent episode, severe (HCC)  Long Term Goal(s): Improvement in symptoms so as ready for discharge  Short Term Goals: Ability to identify changes in lifestyle to reduce recurrence of condition will improve, Ability to disclose and discuss suicidal ideas, Ability to identify and develop effective coping behaviors will improve and Ability to identify triggers associated with substance abuse/mental health issues will improve  I certify that inpatient services furnished can reasonably be expected to improve the patient's condition.    Lauro FranklinAlexander S Irlanda Croghan, MD 11/4/202110:52 AM

## 2020-04-24 NOTE — [facility] Counselor (Signed)
Adult Comprehensive Assessment  Patient ID: Tabitha Hawkins, female   DOB: Oct 10, 1995, 24 y.o.   MRN: [id number]  Information Source: Information source: Patient  Current Stressors:  Patient states their primary concerns and needs for treatment are:: Safety and stabilization Patient states their goals for this hospitilization and ongoing recovery are:: Alleviate SI Educational / Learning stressors: Denies Employment / Job issues: Denies Family Relationships: Denies Surveyor, quantity / Lack of resources (include bankruptcy): "Not enough money" - Pt reports poor spending habits and not saving enough Housing / Lack of housing: Denies stress Physical health (include injuries & life threatening diseases): Denies stress Social relationships: Denies Substance abuse: Pt has history of alcohol use in which she was drinking daily, however she reports now only drinking 1-2x per week; she denies any recent binges. Daily marijuana use Bereavement / Loss: Denies  Living/Environment/Situation:  Living Arrangements: Spouse/significant other Living conditions (as described by patient or guardian): Within normal limits Who else lives in the home?: Boyfriend How long has patient lived in current situation?: Less than a year What is atmosphere in current home: Comfortable, Paramedic, Supportive  Family History:  Marital status: Long term relationship Long term relationship, how long?: "a little over a year" What types of issues is patient dealing with in the relationship?: None currently Additional relationship information: Pt stated that things with boyfriend are "a lot better"; she stated past issues of her speaking with another guy when she was loney and BF is busy. She reports that she and BF now spend more time together Are you sexually active?: Yes What is your sexual orientation?: Heterosexual Has your sexual activity been affected by drugs, alcohol, medication, or emotional stress?: Denies Does patient have  children?: No  Childhood History:  By whom was/is the patient raised?: Adoptive parents Additional childhood history information: Pt was adopted when she was 48 months old and has little to no information about her biological family. She believes her biological mother had addiction issues. She reports having a healthy upbringing Description of patient's relationship with caregiver when they were a child: Healthy Patient's description of current relationship with people who raised him/her: Healthy and she reports they are supportive How were you disciplined when you got in trouble as a child/adolescent?: my mom said she was disappointed in me and i would cry Does patient have siblings?: Yes Number of Siblings: 3 Description of patient's current relationship with siblings: Healthy, denies current issues outside of typical sibling issues Did patient suffer any verbal/emotional/physical/sexual abuse as a child?: No Did patient suffer from severe childhood neglect?: No Has patient ever been sexually abused/assaulted/raped as an adolescent or adult?: No Was the patient ever a victim of a crime or a disaster?: No Witnessed domestic violence?: No Has patient been affected by domestic violence as an adult?: No  Education:  Highest grade 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: Geneticist, molecular Currently a student?: No Learning disability?: No  Employment/Work Situation:   Employment situation: Employed 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 How long has patient been employed?: Few months Patient's job has been impacted by current illness: Yes Describe how patient's job has been impacted: Pt reported that she has had several jobs in the last 3 months due to inability to maintain employment (related to mental health / poor decision making) What is the longest time patient has a held a job?: 3 months Where was the patient employed at that time?: [employer] Has patient ever been in the [employer]?:  No  Financial Resources:  Financial resources: Income from employment, Media planner, Support from parents / caregiver Does patient have a representative payee or guardian?: No  Alcohol/Substance Abuse:   What has been your use of drugs/alcohol within the last 12 months?: Daily marijuana; prior history of binge alcohol use If attempted suicide, did drugs/alcohol play a role in this?: Yes Alcohol/Substance Abuse Treatment Hx: Past Tx, Outpatient, Past Tx, Inpatient If yes, describe treatment: Pt was admitted to this facility in July 2020; she has since been participating in out-patient mental health counseling as well as medication management Has alcohol/substance abuse ever caused legal problems?: Yes (pending DUI charge w/ Court in December 2021)  Social Support System:   Patient's Community Support System: Good Describe Community Support System: Pt has support from parents as well as boyfriend Type of faith/religion: Did not disclsoe How does patient's faith help to cope with current illness?: n/a  Leisure/Recreation:   Do You Have Hobbies?: Yes Leisure and Hobbies: Taking walks, coloring, listening to music  Strengths/Needs:   What is the patient's perception of their strengths?: "Being a server.Marland KitchenMarland KitchenI'm good with people becuase I have to be", Pt also reported being good at making others laugh Patient states they can use these personal strengths during their treatment to contribute to their recovery: Yes Patient states these barriers may affect/interfere with their treatment: "Being alone - I don't like to be alone" Patient states these barriers may affect their return to the community: Pt has healthy supports Other important information patient would like considered in planning for their treatment: Pt will need return to work note to provide to current employer  Discharge Plan:   Currently receiving community mental health services: Yes (From Whom) ([facility] for medication management and [facility] for therapy) Patient states concerns and preferences for aftercare planning are: Pt would like to continue services with her current OP providers Patient states they will know when they are safe and ready for discharge when: improvement in negative thoughts Does patient have access to transportation?: Yes Does patient have financial barriers related to discharge medications?: No Patient description of barriers related to discharge medications: n/a Will patient be returning to same living situation after discharge?: Yes  Summary/Recommendations:   Summary and Recommendations (to be completed by the evaluator): Tabitha Hawkins is a 24 y.o. white female from [place], [place]. She has prior history of in-patient treatment related to suicidal ideations as well as history of alcohol abuse. Pt was admitted to this facility voluntarily on April 23, 2020 with SI and plan to jump off parking garage. In total Pt has history two prior suicide attempts (via overdose and cutting). Pt reports few stressors, stating there are financial concerns (poor spending / not enough saving) and she feels she is getting minimal benefit from current medication regimen. She does admit to frequently missing doses of her medication which likely contributes to lack of benefit. Pt reports that since her last hospitalization with this facility (July 2020) her drinking has reduced significantly and she planning to cease drinking completley post discharge. Pt is currently receiving mental health services in the community. She is receiving medication management with Dr. Isaiah Serge with [facility] as well as 1:1 therapy with Tabitha Hawkins at [facility]. She plans to continue services with these providers. While here, Tabitha Hawkins will benefit from medication management, therapeutic milieu, psychoeducational groups and discharge planning  services for care coordination. It is recommended that Pt develop plan for medication compliance  and continue participation in all mental health services post discharge.   Tabitha Oms Malcom, LCSW 04/24/2020

## 2020-04-24 NOTE — [facility] Suicide Risk Assessment (Signed)
[facility] Admission Suicide Risk Assessment   Nursing information obtained from:  Patient Demographic factors:  Caucasian, Adolescent or young adult Current Mental Status:  Suicidal ideation indicated by patient, Suicide plan Loss Factors:  Financial problems / change in socioeconomic status Historical Factors:  Prior suicide attempts, Impulsivity Risk Reduction Factors:  Employed, Living with another person, especially a relative  Total Time spent with patient: 20 minutes Principal Problem: <principal problem not specified> Diagnosis:  Active Problems:   MDD (major depressive disorder), recurrent episode, severe (HCC)   Data: 24 year old female presenting with complaints of SI, alcohol abuse   Continued Clinical Symptoms:  Alcohol Use Disorder Identification Test Final Score (AUDIT): 5 The "Alcohol Use Disorders Identification Test", Guidelines for Use in Primary Care, Second Edition.  World Science writer [facility]). Score between 0-7:  no or low risk or alcohol related problems. Score between 8-15:  moderate risk of alcohol related problems. Score between 16-19:  high risk of alcohol related problems. Score 20 or above:  warrants further diagnostic evaluation for alcohol dependence and treatment.   CLINICAL FACTORS:   Depression:   Anhedonia Comorbid alcohol abuse/dependence Hopelessness Impulsivity Alcohol/Substance Abuse/Dependencies  See HPI for mental status  COGNITIVE FEATURES THAT CONTRIBUTE TO RISK:  None    SUICIDE RISK:   Moderate:  Frequent suicidal ideation with limited intensity, and duration, some specificity in terms of plans, no associated intent, good self-control, limited dysphoria/symptomatology, some risk factors present, and identifiable protective factors, including available and accessible social support.  PLAN OF CARE: Continue to treat on inpatient basis  I certify that inpatient services furnished can reasonably be expected to improve the patient's  condition.   Clement Sayres, MD 04/24/2020, 4:18 PM

## 2020-04-24 NOTE — Progress Notes (Signed)
Pt's goal is to feel "safe in my skin." Reports stressors as being financial and that she currently works in [employer]. She doesn't elaborate further and is minimal. Pt shares that some of her coping skills are coloring and going for walks. Pt denies SI/HI and AVH.Active listening, reassurance, and support provided. Q 15 min safety checks continue. Pt's safety has been maintained.   04/23/20 2020  Psych Admission Type (Psych Patients Only)  Admission Status Voluntary  Psychosocial Assessment  Patient Complaints Anxiety;Depression;Sadness;Worrying;Hopelessness  Eye Contact Avoids  Facial Expression Sad;Sullen  Affect Anxious;Sad;Depressed  Speech Logical/coherent  Interaction Minimal;Forwards little  Motor Activity Other (Comment) (WNL)  Appearance/Hygiene Disheveled  Behavior Characteristics Cooperative;Appropriate to situation;Anxious;Calm  Mood Depressed;Anxious;Sad;Sullen  Thought Process  Coherency WDL  Content WDL  Delusions None reported or observed  Perception WDL  Hallucination None reported or observed  Judgment Poor  Confusion None  Danger to Self  Current suicidal ideation? Denies  Self-Injurious Behavior No self-injurious ideation or behavior indicators observed or expressed   Agreement Not to Harm Self Yes  Description of Agreement verbally contracts for safety  Danger to Others  Danger to Others None reported or observed

## 2020-04-24 NOTE — Progress Notes (Signed)
[facility] Group Notes:  (Nursing/MHT/Case Management/Adjunct)  Date:  04/24/2020  Time:  2030  Type of Therapy:  wrap up group  Participation Level:  Active  Participation Quality:  Appropriate, Attentive, Sharing and Supportive  Affect:  Depressed  Cognitive:  Appropriate  Insight:  Improving  Engagement in Group:  Engaged  Modes of Intervention:  Clarification, Education and Support  Summary of Progress/Problems: Positive thinking and positive change were discussed. Pt reported getting a lot of rest today. Pt plans on stopping all alcohol intake, communicating better, and decreasing suicidal thoughts. Pt is grateful for her supportive boyfriend.   Marcille Buffy 04/24/2020, 9:08 PM

## 2020-04-25 MED ORDER — ARIPIPRAZOLE 10 MG PO TABS
10.0000 mg | ORAL_TABLET | Freq: Every day | ORAL | Status: DC
  Administered 2020-04-26 – 2020-04-27 (×2): 10 mg via ORAL
  Filled 2020-04-25 (×4): qty 1

## 2020-04-25 NOTE — Tx Team (Signed)
Interdisciplinary Treatment and Diagnostic Plan Update  04/25/2020 Time of Session: 9:05am Tabitha Hawkins MRN: [id number]  Principal Diagnosis: <principal problem not specified>  Secondary Diagnoses: Active Problems:   MDD (major depressive disorder), recurrent episode, severe (HCC)   Current Medications:  Current Facility-Administered Medications  Medication Dose Route Frequency Provider Last Rate Last Admin  . acetaminophen (TYLENOL) tablet 650 mg  650 mg Oral Q6H PRN Aldean Baker, NP      . alum & mag hydroxide-simeth (MAALOX/MYLANTA) 200-200-20 MG/5ML suspension 30 mL  30 mL Oral Q4H PRN Aldean Baker, NP      . ARIPiprazole (ABILIFY) tablet 7.5 mg  7.5 mg Oral Daily Cristofano, Worthy Rancher, MD   7.5 mg at 04/25/20 0808  . FLUoxetine (PROZAC) capsule 40 mg  40 mg Oral Daily Aldean Baker, NP   40 mg at 04/24/20 0957  . hydrOXYzine (ATARAX/VISTARIL) tablet 25 mg  25 mg Oral Q6H PRN Aldean Baker, NP   25 mg at 04/24/20 2132  . loperamide (IMODIUM) capsule 2-4 mg  2-4 mg Oral PRN Aldean Baker, NP      . LORazepam (ATIVAN) tablet 1 mg  1 mg Oral Q6H PRN Aldean Baker, NP      . magnesium hydroxide (MILK OF MAGNESIA) suspension 30 mL  30 mL Oral Daily PRN Aldean Baker, NP      . multivitamin with minerals tablet 1 tablet  1 tablet Oral Daily Aldean Baker, NP   1 tablet at 04/24/20 0957  . ondansetron (ZOFRAN-ODT) disintegrating tablet 4 mg  4 mg Oral Q6H PRN Aldean Baker, NP      . thiamine tablet 100 mg  100 mg Oral Daily Aldean Baker, NP   100 mg at 04/24/20 0957  . traZODone (DESYREL) tablet 50 mg  50 mg Oral QHS PRN Aldean Baker, NP   50 mg at 04/24/20 2132   PTA Medications: Medications Prior to Admission  Medication Sig Dispense Refill Last Dose  . ARIPiprazole (ABILIFY) 5 MG tablet Take 1 tablet (5 mg total) by mouth daily. 30 tablet 2   . Levonorgestrel (KYLEENA) 19.5 MG IUD Kyleena 17.5 mcg/24 hrs (13yrs) 19.5mg  intrauterine device  Take 1 device by intrauterine  route.     Marland Kitchen zolpidem (AMBIEN CR) 6.25 MG CR tablet Take 1 tablet (6.25 mg total) by mouth at bedtime as needed for sleep. 30 tablet 2   . FLUoxetine (PROZAC) 40 MG capsule Take 1 capsule (40 mg total) by mouth daily. (Patient taking differently: Take 40 mg by mouth at bedtime. ) 90 capsule 0     Patient Stressors: Financial difficulties Occupational concerns  Patient Strengths: Capable of independent living Wellsite geologist fund of knowledge Physical Health Supportive family/friends  Treatment Modalities: Medication Management, Group therapy, Case management,  1 to 1 session with clinician, Psychoeducation, Recreational therapy.   Physician Treatment Plan for Primary Diagnosis: <principal problem not specified> Long Term Goal(s): Improvement in symptoms so as ready for discharge Improvement in symptoms so as ready for discharge   Short Term Goals: Ability to identify changes in lifestyle to reduce recurrence of condition will improve Ability to disclose and discuss suicidal ideas Ability to identify changes in lifestyle to reduce recurrence of condition will improve Ability to disclose and discuss suicidal ideas Ability to identify and develop effective coping behaviors will improve Ability to identify triggers associated with substance abuse/mental health issues will improve  Medication Management: Evaluate patient's response, side  effects, and tolerance of medication regimen.  Therapeutic Interventions: 1 to 1 sessions, Unit Group sessions and Medication administration.  Evaluation of Outcomes: Progressing  Physician Treatment Plan for Secondary Diagnosis: Active Problems:   MDD (major depressive disorder), recurrent episode, severe (HCC)  Long Term Goal(s): Improvement in symptoms so as ready for discharge Improvement in symptoms so as ready for discharge   Short Term Goals: Ability to identify changes in lifestyle to reduce recurrence of condition will  improve Ability to disclose and discuss suicidal ideas Ability to identify changes in lifestyle to reduce recurrence of condition will improve Ability to disclose and discuss suicidal ideas Ability to identify and develop effective coping behaviors will improve Ability to identify triggers associated with substance abuse/mental health issues will improve     Medication Management: Evaluate patient's response, side effects, and tolerance of medication regimen.  Therapeutic Interventions: 1 to 1 sessions, Unit Group sessions and Medication administration.  Evaluation of Outcomes: Progressing   RN Treatment Plan for Primary Diagnosis: <principal problem not specified> Long Term Goal(s): Knowledge of disease and therapeutic regimen to maintain health will improve  Short Term Goals: Ability to demonstrate self-control, Ability to participate in decision making will improve and Ability to verbalize feelings will improve  Medication Management: RN will administer medications as ordered by provider, will assess and evaluate patient's response and provide education to patient for prescribed medication. RN will report any adverse and/or side effects to prescribing provider.  Therapeutic Interventions: 1 on 1 counseling sessions, Psychoeducation, Medication administration, Evaluate responses to treatment, Monitor vital signs and CBGs as ordered, Perform/monitor CIWA, COWS, AIMS and Fall Risk screenings as ordered, Perform wound care treatments as ordered.  Evaluation of Outcomes: Progressing   LCSW Treatment Plan for Primary Diagnosis: <principal problem not specified> Long Term Goal(s): Safe transition to appropriate next level of care at discharge, Engage patient in therapeutic group addressing interpersonal concerns.  Short Term Goals: Engage patient in aftercare planning with referrals and resources, Increase social support, Increase emotional regulation and Facilitate acceptance of mental  health diagnosis and concerns  Therapeutic Interventions: Assess for all discharge needs, 1 to 1 time with Social worker, Explore available resources and support systems, Assess for adequacy in community support network, Educate family and significant other(s) on suicide prevention, Complete Psychosocial Assessment, Interpersonal group therapy.  Evaluation of Outcomes: Progressing   Progress in Treatment: Attending groups: No. Participating in groups: No. Taking medication as prescribed: Yes. Toleration medication: Yes. Family/Significant other contact made: No, will contact:   BF Alfonse Spruce [phone] Patient understands diagnosis: Yes. Discussing patient identified problems/goals with staff: Yes. Medical problems stabilized or resolved: Yes. Denies suicidal/homicidal ideation: Yes. Issues/concerns per patient self-inventory: Yes. Other:   New problem(s) identified: No, Describe:  CSW will continue assess  New Short Term/Long Term Goal(s): medication stabilization, elimination of SI thoughts, development of comprehensive mental wellness plan.  Patient Goals:  "to feel safe in my own skin."  Discharge Plan or Barriers: Patient recently admitted. CSW will continue to follow and assess for appropriate referrals and possible discharge planning.  Reason for Continuation of Hospitalization: Depression Medication stabilization Suicidal ideation  Estimated Length of Stay: 3-5 days  Attendees: Patient: Tabitha Hawkins 04/25/2020 9:34 AM  Physician: Dr. Cindi Carbon 04/25/2020 9:34 AM  Nursing:  04/25/2020 9:34 AM  RN Care Manager: 04/25/2020 9:34 AM  Social Worker: Fredirick Lathe, LCSWA 04/25/2020 9:34 AM  Recreational Therapist:  04/25/2020 9:34 AM  Other:  04/25/2020 9:34 AM  Other:  04/25/2020 9:34 AM  Other: 04/25/2020 9:34 AM    Scribe for Treatment Team: Felizardo Hoffmann, LCSWA 04/25/2020 9:34 AM

## 2020-04-25 NOTE — [facility] Group Notes (Signed)
[facility] LCSW Group Therapy  04/25/2020 1:50 PM  Type of Therapy:  Group Therapy  Participation Level:  Active  Participation Quality:  Appropriate  Affect:  Appropriate  Cognitive:  Alert and Appropriate  Insight:  Engaged and Improving  Engagement in Therapy:  Engaged  Modes of Intervention:  Discussion  Summary of Progress/Problems: Pt shared that she interested in intensive outpatient therapy upon discharge from the hospital.   Tabitha Hawkins 04/25/2020, 1:50 PM

## 2020-04-25 NOTE — [facility] Group Notes (Signed)
Adult Psychoeducational Group Note  Date:  04/25/2020 Time:  1:23 PM  Group Topic/Focus:  Emotional Regulation  Participation Level:  Appropriate   Modes of Intervention:  Questioning  Additional Comments:   Donell Beers 04/25/2020, 1:23 PM

## 2020-04-25 NOTE — Progress Notes (Signed)
[facility] MD Progress Note  04/25/2020 7:03 AM Tabitha Hawkins  MRN:  [id number] Subjective:   She reports feeling better today.  States she is beginning to feel safe in her skin. She states that she really enjoyed group work yesterday. She reports getting along well with other patients on the unit.   She reports that she has never had any symptoms of withdrawal from EtOH in the past. She reports no symptoms at this time. She confirmed that she has been reducing her EtOH use already to drinking only 4 beers a day every couple of weeks. She reports that she still plans to stop drinking completely with her boyfriend upon discharge. She confirmed only occasional THC use and no other substance use.   Principal Problem: <principal problem not specified> Diagnosis: Active Problems:   MDD (major depressive disorder), recurrent episode, severe (HCC)  Total Time spent with patient: 15 minutes  Past Psychiatric History: 2 prior suicide attempts (overdose and cutting)(last attempt in July 2021 by cutting wrist), Self Mutilation (cutting), EtOH use disorder MDD, Anxiety Disorder, and ADHD.  Past Medical History:  Past Medical History:  Diagnosis Date  . ADHD (attention deficit hyperactivity disorder)   . H/O multiple concussions    seen by neuro  . Headache    frequent  . Short-term memory loss    cause from multiple concussions?    Past Surgical History:  Procedure Laterality Date  . BUNIONECTOMY    . KNEE ARTHROSCOPY Left 2016   Family History:  Family History  Adopted: Yes  Problem Relation Age of Onset  . Drug abuse Mother    Family Psychiatric  History: Mother has substance abuse history. Pt is adopted so does not know full history. Social History:  Social History   Substance and Sexual Activity  Alcohol Use Yes   Comment: occasionally     Social History   Substance and Sexual Activity  Drug Use Yes  . Types: Marijuana    Social History   Socioeconomic History  . Marital  status: Single    Spouse name: Not on file  . Number of children: 0  . Years of education: Not on file  . Highest education level: Not on file  Occupational History  . Occupation: waitress   Tobacco Use  . Smoking status: Never Smoker  . Smokeless tobacco: Never Used  Vaping Use  . Vaping Use: Every day  . Substances: Nicotine  Substance and Sexual Activity  . Alcohol use: Yes    Comment: occasionally  . Drug use: Yes    Types: Marijuana  . Sexual activity: Yes    Birth control/protection: Implant  Other Topics Concern  . Not on file  Social History Narrative   Household-- father and mother    Stopped college [employer], [place] d/t a concussion, couldn't play volley-ball 05-2015       Social Determinants of Health   Financial Resource Strain:   . Difficulty of Paying Living Expenses: Not on file  Food Insecurity:   . Worried About Programme researcher, broadcasting/film/video in the Last Year: Not on file  . Ran Out of Food in the Last Year: Not on file  Transportation Needs:   . Lack of Transportation (Medical): Not on file  . Lack of Transportation (Non-Medical): Not on file  Physical Activity:   . Days of Exercise per Week: Not on file  . Minutes of Exercise per Session: Not on file  Stress:   . Feeling of Stress :  Not on file  Social Connections:   . Frequency of Communication with Friends and Family: Not on file  . Frequency of Social Gatherings with Friends and Family: Not on file  . Attends Religious Services: Not on file  . Active Member of Clubs or Organizations: Not on file  . Attends Banker Meetings: Not on file  . Marital Status: Not on file   Additional Social History:    Pain Medications: denies Prescriptions: see MAR Over the Counter: none History of alcohol / drug use?: Yes Longest period of sobriety (when/how long): 6 months (2019 around the time of foot surgery) Negative Consequences of Use: Personal relationships Withdrawal Symptoms: Fever /  Chills, Irritability, Sweats, Nausea / Vomiting Name of Substance 1: Alcohol 1 - Age of First Use: 24 yrs old 1 - Amount (size/oz): in the past 2 weeks patient has drank 10-12 beers per day.....she reports drinking 5 out of the 7 days 1 - Frequency: varies; patint goes periods of time without drinking and then reports periods of binging 1 - Duration: on-going 1 - Last Use / Amount: 04/22/2020: #3 beers Name of Substance 2: THC 2 - Age of First Use: 17 2 - Amount (size/oz): 2 grams per day 2 - Frequency: daily 2 - Duration: years 2 - Last Use / Amount: 04/22/2020; 2 grams                Sleep: Good  Appetite:  Good  Current Medications: Current Facility-Administered Medications  Medication Dose Route Frequency Provider Last Rate Last Admin  . acetaminophen (TYLENOL) tablet 650 mg  650 mg Oral Q6H PRN Aldean Baker, NP      . alum & mag hydroxide-simeth (MAALOX/MYLANTA) 200-200-20 MG/5ML suspension 30 mL  30 mL Oral Q4H PRN Aldean Baker, NP      . ARIPiprazole (ABILIFY) tablet 7.5 mg  7.5 mg Oral Daily Cristofano, Paul A, MD      . FLUoxetine (PROZAC) capsule 40 mg  40 mg Oral Daily Aldean Baker, NP   40 mg at 04/24/20 0957  . hydrOXYzine (ATARAX/VISTARIL) tablet 25 mg  25 mg Oral Q6H PRN Aldean Baker, NP   25 mg at 04/24/20 2132  . loperamide (IMODIUM) capsule 2-4 mg  2-4 mg Oral PRN Aldean Baker, NP      . LORazepam (ATIVAN) tablet 1 mg  1 mg Oral Q6H PRN Aldean Baker, NP      . magnesium hydroxide (MILK OF MAGNESIA) suspension 30 mL  30 mL Oral Daily PRN Aldean Baker, NP      . multivitamin with minerals tablet 1 tablet  1 tablet Oral Daily Aldean Baker, NP   1 tablet at 04/24/20 0957  . ondansetron (ZOFRAN-ODT) disintegrating tablet 4 mg  4 mg Oral Q6H PRN Aldean Baker, NP      . thiamine tablet 100 mg  100 mg Oral Daily Aldean Baker, NP   100 mg at 04/24/20 0957  . traZODone (DESYREL) tablet 50 mg  50 mg Oral QHS PRN Aldean Baker, NP   50 mg at 04/24/20  2132    Lab Results:  Results for orders placed or performed during the hospital encounter of 04/23/20 (from the past 48 hour(s))  Respiratory Panel by RT PCR (Flu A&B, Covid) - Nasopharyngeal Swab     Status: None   Collection Time: 04/23/20  2:57 PM   Specimen: Nasopharyngeal Swab  Result Value Ref Range  SARS Coronavirus 2 by RT PCR NEGATIVE NEGATIVE    Comment: (NOTE) SARS-CoV-2 target nucleic acids are NOT DETECTED.  The SARS-CoV-2 RNA is generally detectable in upper respiratoy specimens during the acute phase of infection. The lowest concentration of SARS-CoV-2 viral copies this assay can detect is 131 copies/mL. A negative result does not preclude SARS-Cov-2 infection and should not be used as the sole basis for treatment or other patient management decisions. A negative result may occur with  improper specimen collection/handling, submission of specimen other than nasopharyngeal swab, presence of viral mutation(s) within the areas targeted by this assay, and inadequate number of viral copies (<131 copies/mL). A negative result must be combined with clinical observations, patient history, and epidemiological information. The expected result is Negative.  Fact Sheet for Patients:  https://www.moore.com/  Fact Sheet for Healthcare Providers:  https://www.young.biz/  This test is no t yet approved or cleared by the [place] FDA and  has been authorized for detection and/or diagnosis of SARS-CoV-2 by FDA under an Emergency Use Authorization (EUA). This EUA will remain  in effect (meaning this test can be used) for the duration of the COVID-19 declaration under Section 564(b)(1) of the Act, 21 U.S.C. section 360bbb-3(b)(1), unless the authorization is terminated or revoked sooner.     Influenza A by PCR NEGATIVE NEGATIVE   Influenza B by PCR NEGATIVE NEGATIVE    Comment: (NOTE) The Xpert Xpress SARS-CoV-2/FLU/RSV assay is  intended as an aid in  the diagnosis of influenza from Nasopharyngeal swab specimens and  should not be used as a sole basis for treatment. Nasal washings and  aspirates are unacceptable for Xpert Xpress SARS-CoV-2/FLU/RSV  testing.  Fact Sheet for Patients: https://www.moore.com/  Fact Sheet for Healthcare Providers: https://www.young.biz/  This test is not yet approved or cleared by the [place] FDA and  has been authorized for detection and/or diagnosis of SARS-CoV-2 by  FDA under an Emergency Use Authorization (EUA). This EUA will remain  in effect (meaning this test can be used) for the duration of the  Covid-19 declaration under Section 564(b)(1) of the Act, 21  U.S.C. section 360bbb-3(b)(1), unless the authorization is  terminated or revoked. Performed at [facility], [address] [place], [place] [address]   Pregnancy, urine     Status: None   Collection Time: 04/24/20  6:12 AM  Result Value Ref Range   Preg Test, Ur NEGATIVE NEGATIVE    Comment:        THE SENSITIVITY OF THIS METHODOLOGY IS >20 mIU/mL. Performed at [facility], [address], [place], [place] [address]   Urinalysis, Routine w reflex microscopic Urine, Clean Catch     Status: None   Collection Time: 04/24/20  6:12 AM  Result Value Ref Range   Color, Urine YELLOW YELLOW   APPearance CLEAR CLEAR   Specific Gravity, Urine 1.013 1.005 - 1.030   pH 6.0 5.0 - 8.0   Glucose, UA NEGATIVE NEGATIVE mg/dL   Hgb urine dipstick NEGATIVE NEGATIVE   Bilirubin Urine NEGATIVE NEGATIVE   Ketones, ur NEGATIVE NEGATIVE mg/dL   Protein, ur NEGATIVE NEGATIVE mg/dL   Nitrite NEGATIVE NEGATIVE   Leukocytes,Ua NEGATIVE NEGATIVE    Comment: Performed at  Hospital, [address], [place], [place] [address]  Urine rapid drug screen (hosp performed)not at [facility]     Status: Abnormal   Collection Time: 04/24/20  6:12  AM  Result Value Ref Range   Opiates NONE DETECTED NONE  DETECTED   Cocaine NONE DETECTED NONE DETECTED   Benzodiazepines NONE DETECTED NONE DETECTED   Amphetamines NONE DETECTED NONE DETECTED   Tetrahydrocannabinol POSITIVE (A) NONE DETECTED   Barbiturates NONE DETECTED NONE DETECTED    Comment: (NOTE) DRUG SCREEN FOR MEDICAL PURPOSES ONLY.  IF CONFIRMATION IS NEEDED FOR ANY PURPOSE, NOTIFY LAB WITHIN 5 DAYS.  LOWEST DETECTABLE LIMITS FOR URINE DRUG SCREEN Drug Class                     Cutoff (ng/mL) Amphetamine and metabolites    1000 Barbiturate and metabolites    200 Benzodiazepine                 200 Tricyclics and metabolites     300 Opiates and metabolites        300 Cocaine and metabolites        300 THC                            50 Performed at [facility], [address], [place], [place] [address]   CBC     Status: None   Collection Time: 04/24/20  6:48 AM  Result Value Ref Range   WBC 7.7 4.0 - 10.5 K/uL   RBC 4.52 3.87 - 5.11 MIL/uL   Hemoglobin 14.8 12.0 - 15.0 g/dL   HCT 60.4 36 - 46 %   MCV 96.5 80.0 - 100.0 fL   MCH 32.7 26.0 - 34.0 pg   MCHC 33.9 30.0 - 36.0 g/dL   RDW 54.0 98.1 - 19.1 %   Platelets 230 150 - 400 K/uL   nRBC 0.0 0.0 - 0.2 %    Comment: Performed at [facility], [address] [place], [place] [address]  Comprehensive metabolic panel     Status: Abnormal   Collection Time: 04/24/20  6:48 AM  Result Value Ref Range   Sodium 135 135 - 145 mmol/L   Potassium 3.8 3.5 - 5.1 mmol/L   Chloride 106 98 - 111 mmol/L   CO2 18 (L) 22 - 32 mmol/L   Glucose, Bld 74 70 - 99 mg/dL    Comment: Glucose reference range applies only to samples taken after fasting for at least 8 hours.   BUN 14 6 - 20 mg/dL   Creatinine, Ser 5.62 0.44 - 1.00 mg/dL   Calcium 8.8 (L) 8.9 - 10.3 mg/dL   Total Protein 7.3 6.5 - 8.1 g/dL   Albumin 4.0 3.5 - 5.0 g/dL   AST 15 15 - 41 U/L   ALT 12 0 - 44 U/L   Alkaline  Phosphatase 56 38 - 126 U/L   Total Bilirubin 1.3 (H) 0.3 - 1.2 mg/dL   GFR, Estimated >13 >08 mL/min    Comment: (NOTE) Calculated using the CKD-EPI Creatinine Equation (2021)    Anion gap 11 5 - 15    Comment: Performed at [facility], [address], [place], [place] [address]  Hemoglobin A1c     Status: Abnormal   Collection Time: 04/24/20  6:48 AM  Result Value Ref Range   Hgb A1c MFr Bld 4.6 (L) 4.8 - 5.6 %    Comment: (NOTE) Pre diabetes:          5.7%-6.4%  Diabetes:              >6.4%  Glycemic control for   <7.0% adults with diabetes  Mean Plasma Glucose 85.32 mg/dL    Comment: Performed at [facility], [address], [place], [place] [address]  Ethanol     Status: None   Collection Time: 04/24/20  6:48 AM  Result Value Ref Range   Alcohol, Ethyl (B) <10 <10 mg/dL    Comment: (NOTE) Lowest detectable limit for serum alcohol is 10 mg/dL.  For medical purposes only. Performed at [facility], [address], [place], [place] [address]   Lipid panel     Status: Abnormal   Collection Time: 04/24/20  6:48 AM  Result Value Ref Range   Cholesterol 202 (H) 0 - 200 mg/dL   Triglycerides 83 <098 mg/dL   HDL 98 >11 mg/dL   Total CHOL/HDL Ratio 2.1 RATIO   VLDL 17 0 - 40 mg/dL   LDL Cholesterol 87 0 - 99 mg/dL    Comment:        Total Cholesterol/HDL:CHD Risk Coronary Heart Disease Risk Table                     Men   Women  1/2 Average Risk   3.4   3.3  Average Risk       5.0   4.4  2 X Average Risk   9.6   7.1  3 X Average Risk  23.4   11.0        Use the calculated Patient Ratio above and the CHD Risk Table to determine the patient's CHD Risk.        ATP III CLASSIFICATION (LDL):  <100     mg/dL   Optimal  914-782  mg/dL   Near or Above                    Optimal  130-159  mg/dL   Borderline  956-213  mg/dL   High  >086     mg/dL   Very High Performed at [facility], [address], [place], [place] [address]   TSH     Status: None   Collection Time: 04/24/20  6:48 AM  Result Value Ref Range   TSH 2.978 0.350 - 4.500 uIU/mL    Comment: Performed by a 3rd Generation assay with a functional sensitivity of <=0.01 uIU/mL. Performed at [facility], [address], [place], [place] [address]     Blood Alcohol level:  Lab Results  Component Value Date   ETH <10 04/24/2020   ETH 173 (H) 12/21/2018    Metabolic Disorder Labs: Lab Results  Component Value Date   HGBA1C 4.6 (L) 04/24/2020   MPG 85.32 04/24/2020   MPG 76.71 12/22/2018   No results found for: PROLACTIN Lab Results  Component Value Date   CHOL 202 (H) 04/24/2020   TRIG 83 04/24/2020   HDL 98 04/24/2020   CHOLHDL 2.1 04/24/2020   VLDL 17 04/24/2020   LDLCALC 87 04/24/2020   LDLCALC 83 12/22/2018    Physical Findings: AIMS: Facial and Oral Movements Muscles of Facial Expression: None, normal Lips and Perioral Area: None, normal Jaw: None, normal Tongue: None, normal,Extremity Movements Upper (arms, wrists, hands, fingers): None, normal Lower (legs, knees, ankles, toes): None, normal, Trunk Movements Neck, shoulders, hips: None, normal, Overall Severity Severity of abnormal movements (highest score from questions above): None, normal Incapacitation due to abnormal movements: None, normal Patient's awareness of abnormal movements (rate only patient's report): No Awareness, Dental Status Current problems with teeth  and/or dentures?: No Does patient usually wear dentures?: No  CIWA:  CIWA-Ar Total: 1 COWS:     Musculoskeletal: Strength & Muscle Tone: within normal limits Gait & Station: normal Patient leans: N/A  Psychiatric Specialty Exam: Physical Exam Vitals and nursing note reviewed.  Constitutional:      General: She is not in acute distress.    Appearance: Normal appearance. She is normal weight. She is not ill-appearing, toxic-appearing or diaphoretic.   HENT:     Head: Normocephalic and atraumatic.  Cardiovascular:     Rate and Rhythm: Normal rate.  Pulmonary:     Effort: Pulmonary effort is normal.  Abdominal:     Tenderness: There is no abdominal tenderness.  Neurological:     General: No focal deficit present.     Mental Status: She is alert.     Review of Systems  Constitutional: Negative for fatigue and fever.  Respiratory: Negative for chest tightness and shortness of breath.   Cardiovascular: Negative for chest pain and palpitations.  Gastrointestinal: Negative for abdominal pain, constipation, diarrhea, nausea and vomiting.  Neurological: Negative for dizziness, seizures, light-headedness and headaches.    Blood pressure 116/72, pulse 93, temperature 98 F (36.7 C), temperature source Oral, resp. rate 18, height 5\' 9"  (1.753 m), weight 81.6 kg, SpO2 100 %.Body mass index is 26.58 kg/m.  General Appearance: Well Groomed  Eye Contact:  Good  Speech:  Clear and Coherent and Normal Rate  Volume:  Normal  Mood:  Appropriate  Affect:  Appropriate  Thought Process:  Coherent and Goal Directed  Orientation:  Full (Time, Place, and Person)  Thought Content:  Logical  Suicidal Thoughts:  No  Homicidal Thoughts:  No  Memory:  Immediate;   Good Recent;   Good  Judgement:  Good  Insight:  Good  Psychomotor Activity:  Normal  Concentration:  Concentration: Good and Attention Span: Good  Recall:  Good  Fund of Knowledge:  Good  Language:  Good  Akathisia:  NA  Handed:  Left  AIMS (if indicated):     Assets:  Desire for Improvement Resilience Social Support  ADL's:  Intact  Cognition:  WNL  Sleep:  Number of Hours: 6.75     Treatment Plan Summary: Will increase her Abilify over the next few days. Continue to monitor for symptoms of withdrawal. Encouraged to keep plan of EtOH cessation on discharge. Encourage continued work in group and interactions with other patients on the unit. Today increase Abilify to 7.5  mg Will increase Abilify to 10 mg tomorrow Continue Prozac 40 mg daily  Lauro FranklinAlexander S Randell Teare, MD 04/25/2020, 7:03 AM

## 2020-04-25 NOTE — Progress Notes (Signed)
Recreation Therapy Notes  Date:  11.5.21 Time: 0930 Location: [address] Dayroom  Group Topic: Stress Management  Goal Area(s) Addresses:  Patient will identify positive stress management techniques. Patient will identify benefits of using stress management post d/c.  Intervention: Stress Management  Activity: Guided Imagery.  LRT read a script that focused on taking a walk in the forest.  Patients were to listen an follow along as script was read to engage in activity and become fully relaxed.    Education:  Stress Management, Discharge Planning.   Education Outcome: Acknowledges Education  Clinical Observations/Feedback:  Pt did not attend session.     Caroll Rancher, LRT/CTRS         Caroll Rancher A 04/25/2020 1:37 PM

## 2020-04-25 NOTE — Progress Notes (Signed)
DAR NOTE: Patient presents with anxious affect and depressed mood.  Denies suicidal thoughts, pain, auditory and visual hallucinations.  Rates depression at 1, hopelessness at 0, and anxiety at 1.  Maintained on routine safety checks.  Medications given as prescribed.  Support and encouragement offered as needed.  Attended group and participated.  States goal for today is "to continues to get better."  Patient observed socializing with peers in the dayroom.  Offered no complaint.  Patient is safe on and off the unit.

## 2020-04-25 NOTE — [facility] Suicide Risk Assessment (Signed)
[facility] INPATIENT:  Family/Significant Other Suicide Prevention Education  Education Completed; BF Alfonse Spruce [phone] has been identified by the patient as the family member/significant other with whom the patient will be residing, and identified as the person(s) who will aid the patient in the event of a mental health crisis (suicidal ideations/suicide attempt). With written consent from the patient, the family member/significant other has been provided the following suicide prevention education, prior to the and/or following the discharge of the patient.  The suicide prevention education provided includes the following:   Suicide risk factors   Suicide prevention and interventions   National Suicide Hotline telephone number   [facility] assessment telephone number   [facility] Emergency Assistance 911   [facility] and/or Residential Mobile Crisis Unit telephone number  Request made of family/significant other to:   Remove weapons (e.g., guns, rifles, knives), all items previously/currently identified as safety concern.   Remove drugs/medications (over-the-counter, prescriptions, illicit drugs), all items previously/currently identified as a safety concern.  The family member/significant other verbalizes understanding of the suicide prevention education information provided. The family member/significant other agrees to remove the items of safety concern listed above.  Pt's boyfriend states that pt felt like she needed help and family felt this was the best solution. No weapons in home. No safety concerns regarding discharge.   Fredirick Lathe, LCSWA Clinicial Social Worker [employer]

## 2020-04-25 NOTE — [facility] Group Notes (Signed)
Adult Psychoeducational Group Note  Date:  04/25/2020 Time:  6:25 PM  Group Topic/Focus:  Emotion regulation  Participation Level:  Pt participated appropriately in group.     Tabitha Hawkins 04/25/2020, 6:25 PM

## 2020-04-25 NOTE — Progress Notes (Signed)
   04/25/20 2015  COVID-19 Daily Checkoff  Have you had a fever (temp > 37.80C/100F)  in the past 24 hours?  No  COVID-19 EXPOSURE  Have you traveled outside the state in the past 14 days? No  Have you been in contact with someone with a confirmed diagnosis of COVID-19 or PUI in the past 14 days without wearing appropriate PPE? No  Have you been living in the same home as a person with confirmed diagnosis of COVID-19 or a PUI (household contact)? No  Have you been diagnosed with COVID-19? No

## 2020-04-26 NOTE — [facility] Group Notes (Signed)
LCSW Group Therapy Note  04/26/2020    10:00-11:00am   Type of Therapy and Topic:  Group Therapy: Early Messages Received About Anger  Participation Level:  Did Not Attend   Description of Group:   In this group, patients shared and discussed the early messages received in their lives about anger through parental or other adult modeling, teaching, repression, punishment, violence, and more.  Participants identified how those childhood lessons influence even now how they usually or often react when angered.  The group discussed that anger is a secondary emotion and what may be the underlying emotional themes that come out through anger outbursts or that are ignored through anger suppression.    Therapeutic Goals: Patients will identify at least one childhood message about anger that they received and how it was taught to them. Patients will discuss how these childhood experiences have influenced and continue to influence their own expression or repression of anger even today. Patients will explore possible primary emotions that tend to fuel their secondary emotion of anger.   Summary of Patient Progress:  The patient was invited to group, did not attend.   Therapeutic Modalities:   Cognitive Behavioral Therapy Motivational Interviewing  Burnis Kaser J Grossman-Orr  

## 2020-04-26 NOTE — Progress Notes (Signed)
[facility] MD Progress Note  04/26/2020 8:20 AM Tabitha Hawkins  MRN:  [id number] Subjective:   She reports feeling fine today with the increase in her Abilify yesterday. She reports not feeling more tired. She is agreeable to increasing again today up to 10 mg Abilify. She reports that she is feeling more safe in her skin. Re-enforced that cessation of her EtOH use is a great plan. She reports really enjoying group sessions yesterday and interacting with other patients on the unit. She has no other concerns at this time.   Principal Problem: MDD (major depressive disorder), recurrent episode, severe (HCC) Diagnosis: Principal Problem:   MDD (major depressive disorder), recurrent episode, severe (HCC) Active Problems:   GAD (generalized anxiety disorder)   Alcohol use disorder, moderate, in early remission (HCC)  Total Time spent with patient: 15 minutes  Past Psychiatric History: 2 prior suicide attempts (overdose and cutting)(last attempt in July 2021 by cutting wrist), Self Mutilation (cutting), EtOH use disorder MDD, Anxiety Disorder, and ADHD.  Past Medical History:  Past Medical History:  Diagnosis Date  . ADHD (attention deficit hyperactivity disorder)   . H/O multiple concussions    seen by neuro  . Headache    frequent  . Short-term memory loss    cause from multiple concussions?    Past Surgical History:  Procedure Laterality Date  . BUNIONECTOMY    . KNEE ARTHROSCOPY Left 2016   Family History:  Family History  Adopted: Yes  Problem Relation Age of Onset  . Drug abuse Mother    Family Psychiatric  History:  Mother has substance abuse history. Pt is adopted so does not know full history. Social History:  Social History   Substance and Sexual Activity  Alcohol Use Yes   Comment: occasionally     Social History   Substance and Sexual Activity  Drug Use Yes  . Types: Marijuana    Social History   Socioeconomic History  . Marital status: Single    Spouse name:  Not on file  . Number of children: 0  . Years of education: Not on file  . Highest education level: Not on file  Occupational History  . Occupation: waitress   Tobacco Use  . Smoking status: Never Smoker  . Smokeless tobacco: Never Used  Vaping Use  . Vaping Use: Every day  . Substances: Nicotine  Substance and Sexual Activity  . Alcohol use: Yes    Comment: occasionally  . Drug use: Yes    Types: Marijuana  . Sexual activity: Yes    Birth control/protection: Implant  Other Topics Concern  . Not on file  Social History Narrative   Household-- father and mother    Stopped college [employer], [place] d/t a concussion, couldn't play volley-ball 05-2015       Social Determinants of Health   Financial Resource Strain:   . Difficulty of Paying Living Expenses: Not on file  Food Insecurity:   . Worried About Programme researcher, broadcasting/film/video in the Last Year: Not on file  . Ran Out of Food in the Last Year: Not on file  Transportation Needs:   . Lack of Transportation (Medical): Not on file  . Lack of Transportation (Non-Medical): Not on file  Physical Activity:   . Days of Exercise per Week: Not on file  . Minutes of Exercise per Session: Not on file  Stress:   . Feeling of Stress : Not on file  Social Connections:   . Frequency of  Communication with Friends and Family: Not on file  . Frequency of Social Gatherings with Friends and Family: Not on file  . Attends Religious Services: Not on file  . Active Member of Clubs or Organizations: Not on file  . Attends Banker Meetings: Not on file  . Marital Status: Not on file   Additional Social History:    Pain Medications: denies Prescriptions: see MAR Over the Counter: none History of alcohol / drug use?: Yes Longest period of sobriety (when/how long): 6 months (2019 around the time of foot surgery) Negative Consequences of Use: Personal relationships Withdrawal Symptoms: Fever / Chills, Irritability, Sweats, Nausea /  Vomiting Name of Substance 1: Alcohol 1 - Age of First Use: 24 yrs old 1 - Amount (size/oz): in the past 2 weeks patient has drank 10-12 beers per day.....she reports drinking 5 out of the 7 days 1 - Frequency: varies; patint goes periods of time without drinking and then reports periods of binging 1 - Duration: on-going 1 - Last Use / Amount: 04/22/2020: #3 beers Name of Substance 2: THC 2 - Age of First Use: 17 2 - Amount (size/oz): 2 grams per day 2 - Frequency: daily 2 - Duration: years 2 - Last Use / Amount: 04/22/2020; 2 grams                Sleep: Good  Appetite:  Good  Current Medications: Current Facility-Administered Medications  Medication Dose Route Frequency Provider Last Rate Last Admin  . acetaminophen (TYLENOL) tablet 650 mg  650 mg Oral Q6H PRN Aldean Baker, NP      . alum & mag hydroxide-simeth (MAALOX/MYLANTA) 200-200-20 MG/5ML suspension 30 mL  30 mL Oral Q4H PRN Aldean Baker, NP      . ARIPiprazole (ABILIFY) tablet 10 mg  10 mg Oral Daily Dajanay Northrup, Mardelle Matte, MD      . FLUoxetine (PROZAC) capsule 40 mg  40 mg Oral Daily Aldean Baker, NP   40 mg at 04/25/20 1022  . hydrOXYzine (ATARAX/VISTARIL) tablet 25 mg  25 mg Oral Q6H PRN Aldean Baker, NP   25 mg at 04/25/20 2058  . loperamide (IMODIUM) capsule 2-4 mg  2-4 mg Oral PRN Aldean Baker, NP      . LORazepam (ATIVAN) tablet 1 mg  1 mg Oral Q6H PRN Aldean Baker, NP      . magnesium hydroxide (MILK OF MAGNESIA) suspension 30 mL  30 mL Oral Daily PRN Aldean Baker, NP      . multivitamin with minerals tablet 1 tablet  1 tablet Oral Daily Aldean Baker, NP   1 tablet at 04/25/20 1022  . ondansetron (ZOFRAN-ODT) disintegrating tablet 4 mg  4 mg Oral Q6H PRN Aldean Baker, NP      . thiamine tablet 100 mg  100 mg Oral Daily Marciano Sequin E, NP   100 mg at 04/25/20 1022  . traZODone (DESYREL) tablet 50 mg  50 mg Oral QHS PRN Aldean Baker, NP   50 mg at 04/25/20 2058    Lab Results: No results found  for this or any previous visit (from the past 48 hour(s)).  Blood Alcohol level:  Lab Results  Component Value Date   ETH <10 04/24/2020   ETH 173 (H) 12/21/2018    Metabolic Disorder Labs: Lab Results  Component Value Date   HGBA1C 4.6 (L) 04/24/2020   MPG 85.32 04/24/2020   MPG 76.71 12/22/2018   No  results found for: PROLACTIN Lab Results  Component Value Date   CHOL 202 (H) 04/24/2020   TRIG 83 04/24/2020   HDL 98 04/24/2020   CHOLHDL 2.1 04/24/2020   VLDL 17 04/24/2020   LDLCALC 87 04/24/2020   LDLCALC 83 12/22/2018    Physical Findings: AIMS: Facial and Oral Movements Muscles of Facial Expression: None, normal Lips and Perioral Area: None, normal Jaw: None, normal Tongue: None, normal,Extremity Movements Upper (arms, wrists, hands, fingers): None, normal Lower (legs, knees, ankles, toes): None, normal, Trunk Movements Neck, shoulders, hips: None, normal, Overall Severity Severity of abnormal movements (highest score from questions above): None, normal Incapacitation due to abnormal movements: None, normal Patient's awareness of abnormal movements (rate only patient's report): No Awareness, Dental Status Current problems with teeth and/or dentures?: No Does patient usually wear dentures?: No  CIWA:  CIWA-Ar Total: 1 COWS:     Musculoskeletal: Strength & Muscle Tone: within normal limits Gait & Station: normal Patient leans: N/A  Psychiatric Specialty Exam: Physical Exam Vitals and nursing note reviewed.  Constitutional:      General: She is not in acute distress.    Appearance: Normal appearance. She is normal weight. She is not ill-appearing, toxic-appearing or diaphoretic.  HENT:     Head: Normocephalic and atraumatic.  Cardiovascular:     Rate and Rhythm: Normal rate.  Pulmonary:     Effort: Pulmonary effort is normal.  Abdominal:     Tenderness: There is no abdominal tenderness.  Neurological:     General: No focal deficit present.     Mental  Status: She is alert.     Review of Systems  Constitutional: Negative for fatigue and fever.  Respiratory: Negative for chest tightness and shortness of breath.   Cardiovascular: Negative for chest pain and palpitations.  Gastrointestinal: Negative for abdominal pain, constipation, diarrhea, nausea and vomiting.  Neurological: Negative for dizziness, light-headedness and headaches.  Psychiatric/Behavioral: Negative for agitation, decreased concentration, hallucinations, sleep disturbance and suicidal ideas. The patient is not nervous/anxious.     Blood pressure 110/89, pulse 88, temperature 99.1 F (37.3 C), temperature source Oral, resp. rate 18, height 5\' 9"  (1.753 m), weight 81.6 kg, SpO2 100 %.Body mass index is 26.58 kg/m.  General Appearance: Well Groomed  Eye Contact:  Good  Speech:  Clear and Coherent and Normal Rate  Volume:  Normal  Mood:  Appropriate  Affect:  Appropriate  Thought Process:  Coherent and Goal Directed  Orientation:  Full (Time, Place, and Person)  Thought Content:  Logical  Suicidal Thoughts:  No  Homicidal Thoughts:  No  Memory:  Immediate;   Good Recent;   Good  Judgement:  Good  Insight:  Good  Psychomotor Activity:  Normal  Concentration:  Concentration: Good and Attention Span: Good  Recall:  Good  Fund of Knowledge:  Good  Language:  Good  Akathisia:  NA  Handed:  Left  AIMS (if indicated):     Assets:  Desire for Improvement Resilience Social Support  ADL's:  Intact  Cognition:  WNL  Sleep:  Number of Hours: 6.75     Treatment Plan Summary: Tolerated increase of Abilify to 7.5 mg yesterday. Mood continues to improve and SI and thoughts of self harm are not present. Will increase Abilify to 10 mg today and monitor for response. Continue to monitor for signs of EtOH withdrawal. Plan for discharge tomorrow or Monday. -Increase Abilify to 10 mg daily -Continue Prozac 40 mg -Monitor for withdrawal  Tuesday,  MD 04/26/2020,  8:20 AM

## 2020-04-26 NOTE — Progress Notes (Signed)
Pt shares that she had a "much better" day today. She said that she has found meditating to be effective for her. She has noticed an improvement in her feeling safer in her skin. She also said that she attended more groups today. Pt denies SI/HI and AVH.Active listening, reassurance, and support provided. Medications administered as ordered by MD. Q 15 min safety checks continue. Pt's safety has been maintained.   04/25/20 2015  Charting Type  Charting Type Shift assessment  Orders Chart Check (once per shift) Completed  Safety Check Verification  Has the RN verified the 15 minute safety check completion? Yes  Neurological  Neuro (WDL) WDL  HEENT  HEENT (WDL) WDL  Respiratory  Respiratory (WDL) WDL  Cardiac  Cardiac (WDL) WDL  Vascular  Vascular (WDL) WDL  Integumentary  Integumentary (WDL) WDL  Braden Scale (Ages 8 and up)  Sensory Perceptions 4  Moisture 4  Activity 4  Mobility 4  Nutrition 3  Friction and Shear 3  Braden Scale Score 22  Musculoskeletal  Musculoskeletal (WDL) WDL  Assistive Device None  Gastrointestinal  Gastrointestinal (WDL) WDL  GU Assessment  Genitourinary (WDL) WDL

## 2020-04-26 NOTE — Progress Notes (Signed)
Patient denies depression and anxiety today. She denies SI/HI. She reports good sleep at bedtime. She reports having a good appetite today. She reports that she's a little upset that she wasn't able to go home today.   Orders reviewed. Vital signs reviewed. Verbal support provided. 15 minute checks performed for safety.   Patient compliant with taking medications and denies any medication side effects.

## 2020-04-27 MED ORDER — TRAZODONE HCL 50 MG PO TABS: 50.0000 mg | ORAL_TABLET | Freq: Every evening | ORAL | 0 refills | Status: DC | PRN

## 2020-04-27 MED ORDER — FLUOXETINE HCL 40 MG PO CAPS: 40.0000 mg | ORAL_CAPSULE | Freq: Every day | ORAL | 0 refills | Status: DC

## 2020-04-27 MED ORDER — ARIPIPRAZOLE 10 MG PO TABS: 10.0000 mg | ORAL_TABLET | Freq: Every day | ORAL | 0 refills | Status: DC

## 2020-04-27 NOTE — Progress Notes (Signed)
  [facility] Adult Case Management Discharge Plan :  Will you be returning to the same living situation after discharge:  Yes,  with boyfriend At discharge, do you have transportation home?: Yes,  boyfriend Do you have the ability to pay for your medications: Yes,  insurance and income  Release of information consent forms completed and emailed to Medical Records, then turned in to Medical Records by CSW.   Patient to Follow up at:  Follow-up Information    [facility] Follow up on 04/29/2020.   Specialty: Behavioral Health Why: You have an appointment on 04/29/20 at 10:00 am with Dr. Hinton Dyer.  This will be a Vrtual appointment.  Contact information: [address] [place] [address] [phone]       [facility]. Schedule an appointment as soon as possible for a visit.   Why: This provider requests that you personally call to schedule an appointment for therapy services with [facility]. Contact information: [address] [place] [address] [phone]        [facility]. Schedule an appointment as soon as possible for a visit.   Specialty: Behavioral Health Why: Please contact this provider for intensive outpatient therapy services.   Contact information: [id number] [place] [address] [phone]              Next level of care provider has access to [facility] Link:yes  Safety Planning and Suicide Prevention discussed: Yes,  with boyfriend  Have you used any form of tobacco in the last 30 days? (Cigarettes, Smokeless Tobacco, Cigars, and/or Pipes): No  Has patient been referred to the Quitline?: N/A patient is not a smoker  Patient has been referred for addiction treatment: Yes  Lynnell Chad, LCSW 04/27/2020, 9:02 AM

## 2020-04-27 NOTE — Discharge Summary (Signed)
Physician Discharge Summary Note  Patient:  Tabitha Hawkins is an 24 y.o., female MRN:  [id number] DOB:  06/19/1996 Patient phone:  [phone] (home)  Patient address:   [address] [place] [id number],  Total Time spent with patient: 15 minutes  Date of Admission:  04/23/2020 Date of Discharge:  04/27/2020  Reason for Admission:  Per admission assessment note: Tabitha Hawkins is a 24 yr old female who presents voluntarily for suicidal ideation, has a PPHx of 2 prior suicide attempts (overdose and cutting)(last attempt in July 2021 by cutting wrist), Self Mutilation (cutting), EtOH use disorder, MDD, Anxiety Disorder, and ADHD.   She reports that 3 days prior to admission she began having thoughts of jumping off of a parking deck. She reports that she did not go through with it due to an old friend calling her out of the blue. She reports she then called her mother and talked about it her plans and then presented for help. She receives medication management from Dr. Isaiah SergePusculowski- Prozac, Abilify, Ambien. She reports that she has been on these since 2019. She reports that they have helped in the past but due to financial stresses her symptoms have become less controlled. She reports being able to get her medication and taking it regularly. She reports she is seen every 2 months but missed her last appointment so has not been seen for ~4 months.  Principal Problem: MDD (major depressive disorder), recurrent episode, severe (HCC) Discharge Diagnoses: Principal Problem:   MDD (major depressive disorder), recurrent episode, severe (HCC) Active Problems:   GAD (generalized anxiety disorder)   Alcohol use disorder, moderate, in early remission [facility](HCC)   Past Psychiatric History:   Past Medical History:  Past Medical History:  Diagnosis Date  . ADHD (attention deficit hyperactivity disorder)   . H/O multiple concussions    seen by neuro  . Headache    frequent  . Short-term memory loss     cause from multiple concussions?    Past Surgical History:  Procedure Laterality Date  . BUNIONECTOMY    . KNEE ARTHROSCOPY Left 2016   Family History:  Family History  Adopted: Yes  Problem Relation Age of Onset  . Drug abuse Mother    Family Psychiatric  History:  Social History:  Social History   Substance and Sexual Activity  Alcohol Use Yes   Comment: occasionally     Social History   Substance and Sexual Activity  Drug Use Yes  . Types: Marijuana    Social History   Socioeconomic History  . Marital status: Single    Spouse name: Not on file  . Number of children: 0  . Years of education: Not on file  . Highest education level: Not on file  Occupational History  . Occupation: waitress   Tobacco Use  . Smoking status: Never Smoker  . Smokeless tobacco: Never Used  Vaping Use  . Vaping Use: Every day  . Substances: Nicotine  Substance and Sexual Activity  . Alcohol use: Yes    Comment: occasionally  . Drug use: Yes    Types: Marijuana  . Sexual activity: Yes    Birth control/protection: Implant  Other Topics Concern  . Not on file  Social History Narrative   Household-- father and mother    Stopped [employer] d/t a concussion, couldn't play volley-ball 05-2015       Social Determinants of Health   Financial Resource Strain:   . Difficulty of Paying  Living Expenses: Not on file  Food Insecurity:   . Worried About Programme researcher, broadcasting/film/video in the Last Year: Not on file  . Ran Out of Food in the Last Year: Not on file  Transportation Needs:   . Lack of Transportation (Medical): Not on file  . Lack of Transportation (Non-Medical): Not on file  Physical Activity:   . Days of Exercise per Week: Not on file  . Minutes of Exercise per Session: Not on file  Stress:   . Feeling of Stress : Not on file  Social Connections:   . Frequency of Communication with Friends and Family: Not on file  . Frequency of Social Gatherings with Friends  and Family: Not on file  . Attends Religious Services: Not on file  . Active Member of Clubs or Organizations: Not on file  . Attends Banker Meetings: Not on file  . Marital Status: Not on file    Hospital Course:  Tabitha Hawkins was admitted for MDD (major depressive disorder), recurrent episode, severe (HCC)  and crisis management.  Pt was treated discharged with the medications listed below under Medication List.  Medical problems were identified and treated as needed.  Home medications were restarted as appropriate.  Improvement was monitored by observation and Tabitha Hawkins 's daily report of symptom reduction.  Emotional and mental status was monitored by daily self-inventory reports completed by Tabitha Hawkins and clinical staff.         Tabitha Hawkins was evaluated by the treatment team for stability and plans for continued recovery upon discharge. Tabitha Hawkins 's motivation was an integral factor for scheduling further treatment. Employment, transportation, bed availability, health status, family support, and any pending legal issues were also considered during hospital stay. Pt was offered further treatment options upon discharge including but not limited to Residential, Intensive Outpatient, and Outpatient treatment.  Tabitha Hawkins will follow up with the services as listed below under Follow Up Information.     Upon completion of this admission the patient was both mentally and medically stable for discharge denying suicidal/homicidal ideation, auditory/visual/tactile hallucinations, delusional thoughts and paranoia.    Tabitha Hawkins responded well to treatment with Abilify 10mg  and Prozac 40 mg daily without adverse effects.Tabitha Hawkins demonstrated improvement without reported or observed adverse effects to the point of stability appropriate for outpatient management. Pertinent labs include: Lipiod 202 and A1c 4.6  for which outpatient follow-up is necessary for lab recheck as  mentioned below. Reviewed CBC, CMP, BAL, and UDS; all unremarkable aside from noted exceptions.   Physical Findings: AIMS: Facial and Oral Movements Muscles of Facial Expression: None, normal Lips and Perioral Area: None, normal Jaw: None, normal Tongue: None, normal,Extremity Movements Upper (arms, wrists, hands, fingers): None, normal Lower (legs, knees, ankles, toes): None, normal, Trunk Movements Neck, shoulders, hips: None, normal, Overall Severity Severity of abnormal movements (highest score from questions above): None, normal Incapacitation due to abnormal movements: None, normal Patient's awareness of abnormal movements (rate only patient's report): No Awareness, Dental Status Current problems with teeth and/or dentures?: No Does patient usually wear dentures?: No  CIWA:  CIWA-Ar Total: 0 COWS:     Musculoskeletal: Strength & Muscle Tone: within normal limits Gait & Station: normal Patient leans: N/A  Psychiatric Specialty Exam: See SRA by MD  Physical Exam Vitals reviewed.  Neurological:     Mental Status: She is alert.  Psychiatric:        Attention and Perception:  Attention normal.        Mood and Affect: Mood normal.        Thought Content: Thought content normal.        Judgment: Judgment normal.     Review of Systems  All other systems reviewed and are negative.   Blood pressure 107/74, pulse 97, temperature 98 F (36.7 C), temperature source Oral, resp. rate 18, height 5\' 9"  (1.753 m), weight 81.6 kg, SpO2 100 %.Body mass index is 26.58 kg/m.  Have you used any form of tobacco in the last 30 days? (Cigarettes, Smokeless Tobacco, Cigars, and/or Pipes): No  Has this patient used any form of tobacco in the last 30 days? (Cigarettes, Smokeless Tobacco, Cigars, and/or Pipes) No  Blood Alcohol level:  Lab Results  Component Value Date   ETH <10 04/24/2020   ETH 173 (H) 12/21/2018    Metabolic Disorder Labs:  Lab Results  Component Value Date   HGBA1C  4.6 (L) 04/24/2020   MPG 85.32 04/24/2020   MPG 76.71 12/22/2018   No results found for: PROLACTIN Lab Results  Component Value Date   CHOL 202 (H) 04/24/2020   TRIG 83 04/24/2020   HDL 98 04/24/2020   CHOLHDL 2.1 04/24/2020   VLDL 17 04/24/2020   LDLCALC 87 04/24/2020   LDLCALC 83 12/22/2018    See Psychiatric Specialty Exam and Suicide Risk Assessment completed by Attending Physician prior to discharge.  Discharge destination:  Home  Is patient on multiple antipsychotic therapies at discharge:  No   Has Patient had three or more failed trials of antipsychotic monotherapy by history:  No  Recommended Plan for Multiple Antipsychotic Therapies: NA  Discharge Instructions    Diet - low sodium heart healthy   Complete by: As directed    Discharge instructions   Complete by: As directed    Take all medications as prescribed. Keep all follow-up appointments as scheduled.  Do not consume alcohol or use illegal drugs while on prescription medications. Report any adverse effects from your medications to your primary care provider promptly.  In the event of recurrent symptoms or worsening symptoms, call 911, a crisis hotline, or go to the nearest emergency department for evaluation.   Increase activity slowly   Complete by: As directed      Allergies as of 04/27/2020   No Known Allergies     Medication List    STOP taking these medications   zolpidem 6.25 MG CR tablet Commonly known as: AMBIEN CR     TAKE these medications     Indication  ARIPiprazole 10 MG tablet Commonly known as: ABILIFY Take 1 tablet (10 mg total) by mouth daily. Start taking on: April 28, 2020 What changed:   medication strength  how much to take  Indication: Major Depressive Disorder   FLUoxetine 40 MG capsule Commonly known as: PROZAC Take 1 capsule (40 mg total) by mouth daily. What changed: when to take this  Indication: Depression   Kyleena 19.5 MG IUD Generic drug:  levonorgestrel Kyleena 17.5 mcg/24 hrs (47yrs) 19.5mg  intrauterine device  Take 1 device by intrauterine route.  Indication: Birth Control Treatment   traZODone 50 MG tablet Commonly known as: DESYREL Take 1 tablet (50 mg total) by mouth at bedtime as needed for sleep.  Indication: Anxiety Disorder       Follow-up Information    [facility] Follow up on 04/29/2020.   Specialty: Behavioral Health Why: You have an appointment on 04/29/20 at  10:00 am with Dr. Hinton Dyer.  This will be a Vrtual appointment.  Contact information: [address] [place] [address] [phone]       Llc, [facility]. Schedule an appointment as soon as possible for a visit.   Why: This provider requests that you personally call to schedule an appointment for therapy services with [facility]. Contact information: [address] [place] [address] [phone]        BEHAVIORAL HEALTH INTENSIVE PSYCH. Schedule an appointment as soon as possible for a visit.   Specialty: Behavioral Health Why: Please contact this provider for intensive outpatient therapy services.   Contact information: [address] [id number] mc [place] [address] [phone]              Follow-up recommendations:  Activity:  as tolerated Diet:  heart healthy  Comments:  Take all medications as prescribed. Keep all follow-up appointments as scheduled.  Do not consume alcohol or use illegal drugs while on prescription medications. Report any adverse effects from your medications to your primary care provider promptly.  In the event of recurrent symptoms or worsening symptoms, call 911, a crisis hotline, or go to the nearest emergency department for evaluation.   Signed: Oneta Rack, NP 04/27/2020, 8:44 AM

## 2020-04-27 NOTE — BHH Suicide Risk Assessment (Signed)
[facility] Discharge Suicide Risk Assessment   Principal Problem: MDD (major depressive disorder), recurrent episode, severe (HCC) Discharge Diagnoses: Principal Problem:   MDD (major depressive disorder), recurrent episode, severe (HCC) Active Problems:   GAD (generalized anxiety disorder)   Alcohol use disorder, moderate, in early remission (HCC)   Total Time spent with patient: 20 minutes  Musculoskeletal: Strength & Muscle Tone: within normal limits Gait & Station: normal Patient leans: N/A  Psychiatric Specialty Exam: Review of Systems  All other systems reviewed and are negative.   Blood pressure 107/74, pulse 97, temperature 98 F (36.7 C), temperature source Oral, resp. rate 18, height 5\' 9"  (1.753 m), weight 81.6 kg, SpO2 100 %.Body mass index is 26.58 kg/m.  General Appearance: Casual  Eye Contact::  Fair  Speech:  Normal Rate409  Volume:  Normal  Mood:  Euthymic  Affect:  Congruent  Thought Process:  Coherent and Descriptions of Associations: Intact  Orientation:  Full (Time, Place, and Person)  Thought Content:  Logical  Suicidal Thoughts:  No  Homicidal Thoughts:  No  Memory:  Immediate;   Fair Recent;   Fair Remote;   Fair  Judgement:  Intact  Insight:  Fair  Psychomotor Activity:  Normal  Concentration:  Fair  Recall:  [id number] of Knowledge:Good  Language: Good  Akathisia:  Negative  Handed:  Right  AIMS (if indicated):     Assets:  Desire for Improvement Resilience  Sleep:  Number of Hours: 6.75  Cognition: WNL  ADL's:  Intact   Mental Status Per Nursing Assessment::   On Admission:  Suicidal ideation indicated by patient, Suicide plan  Demographic Factors:  Caucasian  Loss Factors: NA  Historical Factors: Impulsivity  Risk Reduction Factors:   Positive coping skills or problem solving skills  Continued Clinical Symptoms:  Depression:   Comorbid alcohol abuse/dependence Impulsivity Alcohol/Substance Abuse/Dependencies  Cognitive  Features That Contribute To Risk:  None    Suicide Risk:  Minimal: No identifiable suicidal ideation.  Patients presenting with no risk factors but with morbid ruminations; may be classified as minimal risk based on the severity of the depressive symptoms   Follow-up Information    [facility] Follow up on 04/29/2020.   Specialty: Behavioral Health Why: You have an appointment on 04/29/20 at 10:00 am with Dr. 13/9/21.  This will be a Vrtual appointment.  Contact information: [address] [place] [phone]       Llc, [facility]. Schedule an appointment as soon as possible for a visit.   Why: This provider requests that you personally call to schedule an appointment for therapy services with [facility]. Contact information: [address] [place] [phone]        BEHAVIORAL HEALTH INTENSIVE PSYCH. Schedule an appointment as soon as possible for a visit.   Specialty: Behavioral Health Why: Please contact this provider for intensive outpatient therapy services.   Contact information: [address] [place] [phone]              Plan Of Care/Follow-up recommendations:  Activity:  ad lib  [phone], MD 04/27/2020, 8:55 AM

## 2020-04-27 NOTE — Progress Notes (Signed)
   04/26/20 2120  Psych Admission Type (Psych Patients Only)  Admission Status Voluntary  Psychosocial Assessment  Patient Complaints None  Eye Contact Fair  Facial Expression Anxious  Affect Appropriate to circumstance  Speech Logical/coherent  Interaction Assertive  Motor Activity Other (Comment) (WNL)  Appearance/Hygiene Unremarkable  Thought Process  Coherency WDL  Content WDL  Delusions None reported or observed  Perception WDL  Hallucination None reported or observed  Judgment WDL  Confusion None  Danger to Self  Current suicidal ideation? Denies  Self-Injurious Behavior No self-injurious ideation or behavior indicators observed or expressed   Agreement Not to Harm Self Yes  Description of Agreement verbally contracts for safety  Danger to Others  Danger to Others None reported or observed

## 2020-04-27 NOTE — BHH Group Notes (Signed)
BHH LCSW Group Therapy Note  04/27/2020    Type of Therapy and Topic:  Group Therapy:  Adding Supports Including Yourself  Participation Level:  Active   Description of Group:   Patients in this group were introduced to the concept that additional supports including self-support are an essential part of recovery.  Patients listed what supports they believe they need to add to their lives to achieve their goals at discharge, and they listed such things as therapist, family, doctor, support groups, 12-step groups and service animals.   A song entitled "My Own Hero" was played and a group discussion ensued in which patients stated they could relate to the song and it inspired them to realize they have be willing to help themselves in order to succeed, because other people cannot achieve sobriety or stability for them.  "Fight For It" was played, then "I Am Enough" to encourage patients.  They discussed the impact on them and how they must remain convinced that their lives are worth the effort it takes to become sober and/or stable.  Therapeutic Goals: 1)  demonstrate the importance of being a key part of one's own support system 2)  discuss various available supports 3)  encourage patient to use music as part of their self-support and focus on goals 4)  elicit ideas from patients about supports that need to be added   Summary of Patient Progress:  The patient expressed that everybody in her life feels to her to be healthy right now, although there are co-workers who are perhaps less than ideal.  She participated heavily in group and showed good insight.   Therapeutic Modalities:   Motivational Interviewing Activity  Tabitha Hawkins  2:44 PM

## 2020-04-27 NOTE — Progress Notes (Signed)
Patient denies SI/H. She received both written and verbal discharge instructions. She verbalizes understanding. She agreed to the f/u appt and med regimen. She received an AVS, SRA, transitional record, and prescriptions. Patient gathered belongings from room and locker. Patient safely discharged to the lobby.

## 2020-04-28 ENCOUNTER — Telehealth (HOSPITAL_COMMUNITY): Payer: Self-pay | Admitting: Psychiatry

## 2020-04-28 NOTE — Telephone Encounter (Signed)
D:  Lynette (case mgr at [facility]) referred pt to MH-IOP.  A:  Placed call to orient pt; but after reading chart, case manager feels that the more appropriate referral would be CD-IOP.  Transferred pt's phone call to [employer], LCSW, LCAS.  R:  Pt receptive.

## 2020-04-29 ENCOUNTER — Ambulatory Visit (INDEPENDENT_AMBULATORY_CARE_PROVIDER_SITE_OTHER): Payer: BC Managed Care – PPO | Admitting: Licensed Clinical Social Worker

## 2020-04-29 ENCOUNTER — Telehealth (HOSPITAL_COMMUNITY): Payer: BC Managed Care – PPO | Admitting: Psychiatry

## 2020-04-29 ENCOUNTER — Other Ambulatory Visit: Payer: Self-pay

## 2020-04-29 DIAGNOSIS — F1010 Alcohol abuse, uncomplicated: Secondary | ICD-10-CM

## 2020-04-29 DIAGNOSIS — F1994 Other psychoactive substance use, unspecified with psychoactive substance-induced mood disorder: Secondary | ICD-10-CM

## 2020-04-29 DIAGNOSIS — F1210 Cannabis abuse, uncomplicated: Secondary | ICD-10-CM

## 2020-04-29 NOTE — Progress Notes (Signed)
Completed updated re-assessment following client inpatient admission and previous admission and discharge from CDIOP in 2020.  Client is 23 year old caucasian female recently discharge from [facility] following SI and intoxication. Client has a history of SIB and suicide attempts. Client currently uses marijuana daily and alcohol 3-4 times weekly. Client reports stressors as pending DWI court case and financial concerns. Client reports depressive symptoms worsening for around 1 week. Client reports anxiety symptoms minimal at this time and depression "at a high" thought client notes no SI since being discharge from the hospital. Additional depressive symptoms include oversleeping 912-15 hrs daily) Client reports mental health symptoms started around age 41 which is when she started heavier substance use. Client has a reported history of ADHD, Generalized Anxiety, major depressive disorder, alcohol abuse, cannabis abuse.  Client briefly attended [employer] on volleyball scholarship but got 8 concussions then moved back home at age 67, went to some community college but that did not last and she started working, moved in with friend ( had a xanax binge in 2018) moved out and in 2019 had foot surgery.See previous assessments about hx of traumatic brain injuries causing her to stop playing volleyball and college and using more after returning home. Client has a job at [employer] for a few months. Client and siblings were all adopted (one brother, set of twin bother/sister), twins use substances and biological mother uses substances. Neither adoptive parent used drugs or drank excessively. Client denied history of physical or sexual abuse.  Client states she has a current therapist Peggyann Shoals; [facility]; 5-6 months to address depression and psychiatrist at [facility] for medication management thought continues to minimize substance use at appointments. Client is currently living with her boyfriend of 1 year  who comments on her drinking which client feels helpful to not go overboard.  July 2020 client completed DWI class. Client currently drinks 3-4 beers/day 3-4 times weekly, down from 18 beers daily in 2019/2020. Client notes using 2gm/daily of THC, last use day of assessment. Client has a history of 6 months sobriety in 2019 after a foot surgery but began drinking again when she turned 22. Cocaine: first use age 60; ocassionally, less thatn 1xmonth Xanax: last use 2018 Vape: 1 pod every other day; first use age 73  Client is appropriate for CDIOP at this time though motivation appears somewhat limited due to not wanting to stop smoking marijuana, only manage drinking however this is an abstiance based program. Client will have goals to eliminate substance use and increase use of healthy coping skills to stabilize mood.

## 2020-04-30 ENCOUNTER — Telehealth (INDEPENDENT_AMBULATORY_CARE_PROVIDER_SITE_OTHER): Payer: BC Managed Care – PPO | Admitting: Psychiatry

## 2020-04-30 ENCOUNTER — Other Ambulatory Visit: Payer: Self-pay

## 2020-04-30 DIAGNOSIS — F900 Attention-deficit hyperactivity disorder, predominantly inattentive type: Secondary | ICD-10-CM

## 2020-04-30 DIAGNOSIS — F411 Generalized anxiety disorder: Secondary | ICD-10-CM | POA: Diagnosis not present

## 2020-04-30 DIAGNOSIS — F331 Major depressive disorder, recurrent, moderate: Secondary | ICD-10-CM | POA: Diagnosis not present

## 2020-04-30 DIAGNOSIS — F1021 Alcohol dependence, in remission: Secondary | ICD-10-CM

## 2020-04-30 DIAGNOSIS — F1210 Cannabis abuse, uncomplicated: Secondary | ICD-10-CM

## 2020-04-30 DIAGNOSIS — F332 Major depressive disorder, recurrent severe without psychotic features: Secondary | ICD-10-CM | POA: Diagnosis not present

## 2020-04-30 MED ORDER — ARIPIPRAZOLE 10 MG PO TABS: 10.0000 mg | ORAL_TABLET | Freq: Every day | ORAL | 2 refills | Status: DC

## 2020-04-30 MED ORDER — TRAZODONE HCL 50 MG PO TABS: 50.0000 mg | ORAL_TABLET | Freq: Every evening | ORAL | 2 refills | Status: DC | PRN

## 2020-04-30 NOTE — Progress Notes (Signed)
BH MD/PA/NP OP Progress Note  04/30/2020 10:40 AM Tabitha Hawkins  MRN:  [id number] Interview was conducted by phone and I verified that I was speaking with the correct person using two identifiers. I discussed the limitations of evaluation and management by telemedicine and  the availability of in person appointments. Patient expressed understanding and agreed to proceed. Patient location - home; physician - home office.  Chief Complaint: Depressed mood.  HPI: 24 yo single white famale with hx of depression/anxiety and past dx of ADD. In July 2020 hermood declined to the point that she started to feel suicidal and cut her right wrist. She was admitted to [facility] on 12/21/18 and stayed for 5 days. She associates onset of depression with her having to leave college (was at [employer]) and end of her volleyball career due to recurrent concussions. She admits to having previous SI but never attempted suicide before. She was prescribed fluoxetine 20 mg by her PCP. Dose of fluoxetine was doubled during this hospitalization and her mood has quickly improved. In mid November her anxiety and depression increased (no clear trigger) and she started to drink alcohol excessively - up to 12 pack of beer daily. Her cannabis use also increased to several per day. She [facility] came [facility] as a walk in but was not admitted and instead referred to CD-IOP. She participated in 3 sessions then stopped. She now only drinks alcohol on occasion, none recently. Heruse of marijuana decreased to less than once a week. She has been under some financial stress lately, started to feel hopeless and eventually suicidal. [facility] and was admitted on 11/3 and stayed there till 04/27/20. Interestingly enough, admission note states that she reported drinking 5 days a week and smoking cannabis daily - which she denies doing (see above). On admission her BAL was >10 while she tested positive for THC.  She was continued  on Prozac 40mg  while aripiprazole dose was increased to 10 mg. Her SI resolved rapidly and she reports feeling less depressed. She lives with supportive boyfriend and works as a Production assistant, radioserver. She just had an evaluation for CD-IOP but strongly feels that this is not a right referral as she is not drinking alcohol and only sporadically smoking marijuana. She would like to start MH-IOP instead.    Visit Diagnosis:    ICD-10-CM   1. Major depressive disorder, recurrent episode, moderate (HCC)  F33.1   2. Alcohol use disorder, moderate, in early remission (HCC)  F10.21   3. Cannabis use disorder, mild, abuse  F12.10   4. GAD (generalized anxiety disorder)  F41.1   5. Attention deficit hyperactivity disorder (ADHD), predominantly inattentive type  F90.0     Past Psychiatric History: Please see intake H&P.  Past Medical History:  Past Medical History:  Diagnosis Date  . ADHD (attention deficit hyperactivity disorder)   . H/O multiple concussions    seen by neuro  . Headache    frequent  . Short-term memory loss    cause from multiple concussions?    Past Surgical History:  Procedure Laterality Date  . BUNIONECTOMY    . KNEE ARTHROSCOPY Left 2016    Family Psychiatric History: Reviewed.  Family History:  Family History  Adopted: Yes  Problem Relation Age of Onset  . Drug abuse Mother     Social History:  Social History   Socioeconomic History  . Marital status: Single    Spouse name: Not on file  . Number  of children: 0  . Years of education: Not on file  . Highest education level: Not on file  Occupational History  . Occupation: waitress   Tobacco Use  . Smoking status: Never Smoker  . Smokeless tobacco: Never Used  Vaping Use  . Vaping Use: Every day  . Substances: Nicotine  Substance and Sexual Activity  . Alcohol use: Yes    Comment: occasionally  . Drug use: Yes    Types: Marijuana  . Sexual activity: Yes    Birth control/protection: Implant  Other Topics  Concern  . Not on file  Social History Narrative   Household-- father and mother    Stopped college [employer], [place] d/t a concussion, couldn't play volley-ball 05-2015       Social Determinants of Health   Financial Resource Strain:   . Difficulty of Paying Living Expenses: Not on file  Food Insecurity:   . Worried About Programme researcher, broadcasting/film/video in the Last Year: Not on file  . Ran Out of Food in the Last Year: Not on file  Transportation Needs:   . Lack of Transportation (Medical): Not on file  . Lack of Transportation (Non-Medical): Not on file  Physical Activity:   . Days of Exercise per Week: Not on file  . Minutes of Exercise per Session: Not on file  Stress:   . Feeling of Stress : Not on file  Social Connections:   . Frequency of Communication with Friends and Family: Not on file  . Frequency of Social Gatherings with Friends and Family: Not on file  . Attends Religious Services: Not on file  . Active Member of Clubs or Organizations: Not on file  . Attends Banker Meetings: Not on file  . Marital Status: Not on file    Allergies: No Known Allergies  Metabolic Disorder Labs: Lab Results  Component Value Date   HGBA1C 4.6 (L) 04/24/2020   MPG 85.32 04/24/2020   MPG 76.71 12/22/2018   No results found for: PROLACTIN Lab Results  Component Value Date   CHOL 202 (H) 04/24/2020   TRIG 83 04/24/2020   HDL 98 04/24/2020   CHOLHDL 2.1 04/24/2020   VLDL 17 04/24/2020   LDLCALC 87 04/24/2020   LDLCALC 83 12/22/2018   Lab Results  Component Value Date   TSH 2.978 04/24/2020   TSH 3.452 12/22/2018    Therapeutic Level Labs: No results found for: LITHIUM No results found for: VALPROATE No components found for:  CBMZ  Current Medications: Current Outpatient Medications  Medication Sig Dispense Refill  . [START ON 05/27/2020] ARIPiprazole (ABILIFY) 10 MG tablet Take 1 tablet (10 mg total) by mouth daily. 30 tablet 2  . FLUoxetine (PROZAC) 40 MG  capsule Take 1 capsule (40 mg total) by mouth daily. 90 capsule 0  . Levonorgestrel (KYLEENA) 19.5 MG IUD Kyleena 17.5 mcg/24 hrs (20yrs) 19.5mg  intrauterine device  Take 1 device by intrauterine route.    [name] ON 05/27/2020] traZODone (DESYREL) 50 MG tablet Take 1 tablet (50 mg total) by mouth at bedtime as needed for sleep. 30 tablet 2   No current facility-administered medications for this visit.      Psychiatric Specialty Exam: Review of Systems  Psychiatric/Behavioral: Positive for decreased concentration. The patient is nervous/anxious.   All other systems reviewed and are negative.   There were no vitals taken for this visit.There is no height or weight on file to calculate BMI.  General Appearance: NA  Eye Contact:  NA  Speech:  Clear and Coherent and Normal Rate  Volume:  Normal  Mood:  Anxious and Depressed  Affect:  NA  Thought Process:  Goal Directed and Linear  Orientation:  Full (Time, Place, and Person)  Thought Content: Logical   Suicidal Thoughts:  No  Homicidal Thoughts:  No  Memory:  Immediate;   Good Recent;   Good Remote;   Good  Judgement:  Fair  Insight:  Fair  Psychomotor Activity:  NA  Concentration:  Concentration: Fair  Recall:  Good  Fund of Knowledge: Good  Language: Good  Akathisia:  Negative  Handed:  Right  AIMS (if indicated): not done  Assets:  Communication Skills Desire for Improvement Financial Resources/Insurance Housing Intimacy Social Support Talents/Skills  ADL's:  Intact  Cognition: WNL  Sleep:  Good   Screenings: AIMS     Admission (Discharged) [facility] Admission (Discharged) [facility]  AIMS Total Score 0 0    AUDIT     Admission (Discharged) from [facility] Counselor from 06/04/2019 in [facility]  Admission (Discharged) [facility]  Alcohol Use Disorder Identification Test Final Score (AUDIT) 5 24 0    GAD-7     Counselor from 06/04/2019 in [facility]  Total GAD-7 Score 6    PHQ2-9     Counselor from 06/04/2019 in [facility] DEPENDENCY Office Visit from 05/02/2019 in [facility] at [facility] [employer] [facility] Office Visit from 05/11/2018 in [facility] at [employer]  PHQ-2 Total Score 2 2 0  PHQ-9 Total Score 10 9 0       Assessment and Plan: 24 yo single white famale with hx of depression/anxiety and past dx of ADD. In July 2020 hermood declined to the point that she started to feel suicidal and cut her right wrist. She was admitted to [facility] on 12/21/18 and stayed for 5 days. She associates onset of depression with her having to leave college (was at [employer]) and end of her volleyball career due to recurrent concussions. She admits to having previous SI but never attempted suicide before. She was prescribed fluoxetine 20 mg by her PCP. Dose of fluoxetine was doubled during this hospitalization and her mood has quickly improved. In mid November her anxiety and depression increased (no clear trigger) and she started to drink alcohol excessively - up to 12 pack of beer daily. Her cannabis use also increased to several per day. She eventually came to [facility] as a walk in but was not admitted and instead referred to CD-IOP. She participated in 3 sessions then stopped. She now only drinks alcohol on occasion, none recently. Heruse of marijuana decreased to less than once a week. She has been under some financial stress lately, started to feel hopeless and eventually suicidal. She went to [facility] for help and was admitted on 11/3 and stayed there till 04/27/20. Interestingly enough, admission note states that she reported drinking 5 days a week  and smoking cannabis daily - which she denies doing (see above). On admission her BAL was >10 while she tested positive for THC.  She was continued on Prozac 40mg  while aripiprazole dose was increased to 10 mg. Her SI resolved rapidly and she reports feeling less depressed. She  lives with supportive boyfriend and works as a Production assistant, radio. She just had an evaluation for CD-IOP but strongly feels that this is not a right referral as she is not drinking alcohol and only sporadically smoking marijuana. She would like to start MH-IOP instead.   Dx: MDD recurrent, moderate; GAD; Alcohol use disorder in early remission; Cannabis use disorder, mild; ADHD by hx  Plan:Continue40 mg fluoxetine, aripiprazole 10 mgandtrazodone 50 mg prn insomnia.Next visit with in6 weeks.The plan was discussed with patient who had an opportunity to ask questions and these were all answered. I spend60minutes inphone consultationwith the patient.   Magdalene Patricia, MD 04/30/2020, 10:40 AM

## 2020-05-01 ENCOUNTER — Telehealth (HOSPITAL_COMMUNITY): Payer: Self-pay | Admitting: Psychiatry

## 2020-05-01 NOTE — Telephone Encounter (Signed)
D:  Pt is requesting to start in MH-IOP instead of CD-IOP and Dr. Hinton Dyer has approved.  A:  Placed call to orient pt to MH-IOP.  Encouraged pt to verify her insurance benefits.  Pt will start MH-IOP on 05-05-20 @ 9a.m.  Inform Fulton Reek, LCSW, LCAS and Dr. Hinton Dyer.  R:  Pt receptive.

## 2020-05-05 ENCOUNTER — Encounter (HOSPITAL_COMMUNITY): Payer: Self-pay | Admitting: Psychiatry

## 2020-05-05 ENCOUNTER — Telehealth (HOSPITAL_COMMUNITY): Payer: Self-pay | Admitting: Psychiatry

## 2020-05-05 ENCOUNTER — Other Ambulatory Visit (HOSPITAL_COMMUNITY): Payer: BC Managed Care – PPO | Admitting: Psychiatry

## 2020-05-05 ENCOUNTER — Other Ambulatory Visit: Payer: Self-pay

## 2020-05-05 NOTE — Telephone Encounter (Signed)
D:  Patient only was logged into the virtual MH-IOP for check-in.  According to group leader she logged off and didn't return for any of the groups this morning.  Case manager has been trying to reach patient all morning, but there's no answer.  Voicemail came on just now and case manager left pt a vm to call her back.  A:  Inform Hillery Jacks, NP and Dr. Hinton Dyer.  Will continue trying to reach patient by phone.

## 2020-05-06 ENCOUNTER — Other Ambulatory Visit: Payer: Self-pay

## 2020-05-06 ENCOUNTER — Other Ambulatory Visit (HOSPITAL_COMMUNITY): Payer: BC Managed Care – PPO | Admitting: Psychiatry

## 2020-05-06 ENCOUNTER — Telehealth (HOSPITAL_COMMUNITY): Payer: Self-pay | Admitting: Psychiatry

## 2020-05-06 NOTE — Telephone Encounter (Signed)
D:  Placed call to patient again, since she didn't log back into virtual MH-IOP yesterday and didn't return case manager's calls.  A:  Pt answered and stated that she didn't feel comfortable in the group yesterday.  "I feel more comfortable seeing my therapist instead."  Case manager provided pt with support and encouraged her to call if she decides she would like to try group again.  Pt denies any SI/HI or A/V hallucinations.  R:  Pt receptive.

## 2020-05-07 ENCOUNTER — Ambulatory Visit (HOSPITAL_COMMUNITY): Payer: BC Managed Care – PPO

## 2020-05-08 ENCOUNTER — Ambulatory Visit (HOSPITAL_COMMUNITY): Payer: BC Managed Care – PPO

## 2020-05-08 DIAGNOSIS — R52 Pain, unspecified: Secondary | ICD-10-CM | POA: Diagnosis not present

## 2020-05-08 DIAGNOSIS — Z20822 Contact with and (suspected) exposure to covid-19: Secondary | ICD-10-CM | POA: Diagnosis not present

## 2020-05-08 DIAGNOSIS — R5383 Other fatigue: Secondary | ICD-10-CM | POA: Diagnosis not present

## 2020-05-08 DIAGNOSIS — J029 Acute pharyngitis, unspecified: Secondary | ICD-10-CM | POA: Diagnosis not present

## 2020-05-08 DIAGNOSIS — Z9189 Other specified personal risk factors, not elsewhere classified: Secondary | ICD-10-CM | POA: Diagnosis not present

## 2020-05-09 ENCOUNTER — Ambulatory Visit (HOSPITAL_COMMUNITY): Payer: BC Managed Care – PPO

## 2020-05-12 ENCOUNTER — Ambulatory Visit (HOSPITAL_COMMUNITY): Payer: BC Managed Care – PPO

## 2020-05-13 ENCOUNTER — Ambulatory Visit (HOSPITAL_COMMUNITY): Payer: BC Managed Care – PPO

## 2020-05-14 ENCOUNTER — Ambulatory Visit (HOSPITAL_COMMUNITY): Payer: BC Managed Care – PPO

## 2020-05-19 ENCOUNTER — Ambulatory Visit (HOSPITAL_COMMUNITY): Payer: BC Managed Care – PPO

## 2020-05-20 ENCOUNTER — Ambulatory Visit (HOSPITAL_COMMUNITY): Payer: BC Managed Care – PPO

## 2020-05-29 DIAGNOSIS — F332 Major depressive disorder, recurrent severe without psychotic features: Secondary | ICD-10-CM | POA: Diagnosis not present

## 2020-06-11 ENCOUNTER — Telehealth (HOSPITAL_COMMUNITY): Payer: BC Managed Care – PPO | Admitting: Psychiatry

## 2020-06-11 ENCOUNTER — Other Ambulatory Visit: Payer: Self-pay

## 2020-06-16 ENCOUNTER — Other Ambulatory Visit (HOSPITAL_COMMUNITY): Payer: Self-pay | Admitting: Family

## 2020-06-16 DIAGNOSIS — L508 Other urticaria: Secondary | ICD-10-CM | POA: Diagnosis not present

## 2020-06-17 ENCOUNTER — Other Ambulatory Visit (HOSPITAL_COMMUNITY): Payer: Self-pay | Admitting: *Deleted

## 2020-06-17 MED ORDER — TRAZODONE HCL 50 MG PO TABS: 50.0000 mg | ORAL_TABLET | Freq: Every evening | ORAL | 2 refills | Status: DC | PRN

## 2020-06-17 MED ORDER — ARIPIPRAZOLE 10 MG PO TABS: 10.0000 mg | ORAL_TABLET | Freq: Every day | ORAL | 2 refills | Status: DC

## 2020-06-18 ENCOUNTER — Other Ambulatory Visit: Payer: Self-pay

## 2020-06-18 ENCOUNTER — Telehealth (INDEPENDENT_AMBULATORY_CARE_PROVIDER_SITE_OTHER): Payer: BC Managed Care – PPO | Admitting: Psychiatry

## 2020-06-18 ENCOUNTER — Other Ambulatory Visit (HOSPITAL_COMMUNITY): Payer: Self-pay | Admitting: Psychiatry

## 2020-06-18 DIAGNOSIS — F1210 Cannabis abuse, uncomplicated: Secondary | ICD-10-CM | POA: Diagnosis not present

## 2020-06-18 DIAGNOSIS — F1021 Alcohol dependence, in remission: Secondary | ICD-10-CM

## 2020-06-18 DIAGNOSIS — F3341 Major depressive disorder, recurrent, in partial remission: Secondary | ICD-10-CM | POA: Diagnosis not present

## 2020-06-18 DIAGNOSIS — F411 Generalized anxiety disorder: Secondary | ICD-10-CM | POA: Diagnosis not present

## 2020-06-18 MED ORDER — TRAZODONE HCL 50 MG PO TABS: 50.0000 mg | ORAL_TABLET | Freq: Every evening | ORAL | 0 refills | Status: DC | PRN

## 2020-06-18 MED ORDER — FLUOXETINE HCL 40 MG PO CAPS: 40.0000 mg | ORAL_CAPSULE | Freq: Every day | ORAL | 1 refills | Status: DC

## 2020-06-18 MED ORDER — FLUOXETINE HCL 40 MG PO CAPS: 40.0000 mg | ORAL_CAPSULE | Freq: Every day | ORAL | 0 refills | Status: DC

## 2020-06-18 MED ORDER — ARIPIPRAZOLE 10 MG PO TABS: 10.0000 mg | ORAL_TABLET | Freq: Every day | ORAL | 0 refills | Status: DC

## 2020-06-18 NOTE — Progress Notes (Signed)
BH MD/PA/NP OP Progress Note  06/18/2020 9:01 AM Tabitha Hawkins  MRN:  [id number] Interview was conducted by phone and I verified that I was speaking with the correct person using two identifiers. I discussed the limitations of evaluation and management by telemedicine and  the availability of in person appointments. Patient expressed understanding and agreed to proceed. Participants in the visit: patient (location - home); physician (location - home office).  Chief Complaint: "I'm all right".  HPI: 24yo single white famale with hx of depression/anxiety and past dx of ADD. In July 2020 hermood declined to the point that she started to feel suicidal and cut her right wrist. She was admitted to [facility] on 12/21/18 and stayed for 5 days. She associates onset of depression with her having to leave college (was at [employer]) and end of her volleyball career due to recurrent concussions. She admits to having previous SI but never attempted suicide before. She was prescribed fluoxetine 20 mg by her PCP. Dose of fluoxetine was doubled during this hospitalization and her mood has quickly improved. In mid November her anxiety and depression increased (no clear trigger) and she started to drink alcohol excessively - up to 12 pack of beer daily. Her cannabis use also increased to several per day. She eventually came to [facility] as a walk in but was not admitted and instead referred to CD-IOP. She participated in 3 sessions then stopped. She now only drinks alcoholon occasion, none recently. Heruse of marijuana decreased to less than once a week. She has been under some financial stress lately, started to feel hopeless and eventually suicidal. She went to [facility] for help and was admitted on 11/3 and stayed there till 04/27/20. Interestingly enough, admission note states that she reported drinking 5 days a week and smoking cannabis daily - which she denies doing (see above). On admission her BAL was >10 while she  tested positive for THC.  She was continued on Prozac 40mg  while aripiprazole dose was increased to 10 mg. Her SI resolved rapidly and she reports feeling less depressed. 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 She decided against starting IOP and instead continues in weekly sessions with individual therapist. She is not drinking alcohol and only sporadically smoking marijuana.      Visit Diagnosis:    ICD-10-CM   1. GAD (generalized anxiety disorder)  F41.1   2. Alcohol use disorder, moderate, in early remission (HCC)  F10.21   3. Cannabis use disorder, mild, abuse  F12.10   4. Major depressive disorder, recurrent episode, in partial remission (HCC)  F33.41     Past Psychiatric History: Please see intake H&P.  Past Medical History:  Past Medical History:  Diagnosis Date  . ADHD (attention deficit hyperactivity disorder)   . H/O multiple concussions    seen by neuro  . Headache    frequent  . Short-term memory loss    cause from multiple concussions?    Past Surgical History:  Procedure Laterality Date  . BUNIONECTOMY    . KNEE ARTHROSCOPY Left 2016    Family Psychiatric History: Reviewed.  Family History:  Family History  Adopted: Yes  Problem Relation Age of Onset  . Drug abuse Mother     Social History:  Social History   Socioeconomic History  . Marital status: Single    Spouse name: Not on file  . Number of children: 0  . Years of education: Not on file  .  Highest education level: Not on file  Occupational History  . Occupation: waitress   Tobacco Use  . Smoking status: Never Smoker  . Smokeless tobacco: Never Used  Vaping Use  . Vaping Use: Every day  . Substances: Nicotine  Substance and Sexual Activity  . Alcohol use: Yes    Comment: occasionally  . Drug use: Yes    Types: Marijuana  . Sexual activity: Yes    Birth control/protection: Implant  Other Topics Concern  . Not on file  Social History Narrative    Household-- father and mother    Stopped college [employer], [place] d/t a concussion, couldn't play volley-ball 05-2015       Social Determinants of Health   Financial Resource Strain: Not on file  Food Insecurity: Not on file  Transportation Needs: Not on file  Physical Activity: Not on file  Stress: Not on file  Social Connections: Not on file    Allergies: No Known Allergies  Metabolic Disorder Labs: Lab Results  Component Value Date   HGBA1C 4.6 (L) 04/24/2020   MPG 85.32 04/24/2020   MPG 76.71 12/22/2018   No results found for: PROLACTIN Lab Results  Component Value Date   CHOL 202 (H) 04/24/2020   TRIG 83 04/24/2020   HDL 98 04/24/2020   CHOLHDL 2.1 04/24/2020   VLDL 17 04/24/2020   LDLCALC 87 04/24/2020   LDLCALC 83 12/22/2018   Lab Results  Component Value Date   TSH 2.978 04/24/2020   TSH 3.452 12/22/2018    Therapeutic Level Labs: No results found for: LITHIUM No results found for: VALPROATE No components found for:  CBMZ  Current Medications: Current Outpatient Medications  Medication Sig Dispense Refill  . ARIPiprazole (ABILIFY) 10 MG tablet Take 1 tablet (10 mg total) by mouth daily. 30 tablet 0  . [START ON 07/25/2020] FLUoxetine (PROZAC) 40 MG capsule Take 1 capsule (40 mg total) by mouth daily. 90 capsule 1  . Levonorgestrel (KYLEENA) 19.5 MG IUD Kyleena 17.5 mcg/24 hrs (82yrs) 19.5mg  intrauterine device  Take 1 device by intrauterine route.    . traZODone (DESYREL) 50 MG tablet Take 1 tablet (50 mg total) by mouth at bedtime as needed for sleep. 30 tablet 0   No current facility-administered medications for this visit.    Psychiatric Specialty Exam: Review of Systems  Psychiatric/Behavioral: Positive for sleep disturbance. The patient is nervous/anxious.   All other systems reviewed and are negative.   There were no vitals taken for this visit.There is no height or weight on file to calculate BMI.  General Appearance: NA  Eye Contact:   NA  Speech:  Clear and Coherent and Normal Rate  Volume:  Normal  Mood:  Anxious  Affect:  NA  Thought Process:  Goal Directed  Orientation:  Full (Time, Place, and Person)  Thought Content: Logical   Suicidal Thoughts:  No  Homicidal Thoughts:  No  Memory:  Immediate;   Good Recent;   Good Remote;   Good  Judgement:  Fair  Insight:  Fair  Psychomotor Activity:  NA  Concentration:  Concentration: Good  Recall:  Good  Fund of Knowledge: Good  Language: Good  Akathisia:  Negative  Handed:  Right  AIMS (if indicated): not done  Assets:  Communication Skills Desire for Improvement Financial Resources/Insurance Housing Social Support Talents/Skills  ADL's:  Intact  Cognition: WNL  Sleep:  Fair   Screenings: AIMS   Flowsheet Row Admission (Discharged) from [facility] Admission (Discharged) from [facility]  AIMS Total Score 0 0    AUDIT   Flowsheet Row Admission (Discharged) from OP [facility] Counselor from 06/04/2019 in [facility] DEPENDENCY Admission (Discharged) from [facility]  Alcohol Use Disorder Identification Test Final Score (AUDIT) 5 24 0    GAD-7   Flowsheet Row Counselor from 06/04/2019 in [facility] DEPENDENCY  Total GAD-7 Score 6    PHQ2-9   Flowsheet Row Counselor from 06/04/2019 in BE[facility] CHEMICAL DEPENDENCY Office Visit from 05/02/2019 in [facility] at [facility] [employer] Office Visit from 05/11/2018 in [facility]  PHQ-2 Total Score 2 2 0  PHQ-9 Total Score 10 9 0       Assessment and Plan: 24yo single white famale with hx of depression/anxiety and past dx of ADD. In July 2020 hermood declined to the point  that she started to feel suicidal and cut her right wrist. She was admitted to [facility] on 12/21/18 and stayed for 5 days. She associates onset of depression with her having to leave college (was at [employer]) and end of her volleyball career due to recurrent concussions. She admits to having previous SI but never attempted suicide before. She was prescribed fluoxetine 20 mg by her PCP. Dose of fluoxetine was doubled during this hospitalization and her mood has quickly improved. In mid November her anxiety and depression increased (no clear trigger) and she started to drink alcohol excessively - up to 12 pack of beer daily. Her cannabis use also increased to several per day. She eventually came to [facility] as a walk in but was not admitted and instead referred to CD-IOP. She participated in 3 sessions then stopped. She now only drinks alcoholon occasion, none recently. Heruse of marijuana decreased to less than once a week. She has been under some financial stress lately, started to feel hopeless and eventually suicidal. She went to [facility] for help and was admitted on 11/3 and stayed there till 04/27/20. Interestingly enough, admission note states that she reported drinking 5 days a week and smoking cannabis daily - which she denies doing (see above). On admission her BAL was >10 while she tested positive for THC.  She was continued on Prozac 40mg  while aripiprazole dose was increased to 10 mg. Her SI resolved rapidly and she reports feeling less depressed. 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 She decided against starting IOP and instead continues in weekly sessions with individual therapist. She is not drinking alcohol and only sporadically smoking marijuana.    Dx: MDD recurrent,in partial remission; GAD; Alcohol use disorder in early remission; Cannabis use disorder, mild; ADHD by hx  Plan:Continue40 mg fluoxetine, aripiprazole 10 mgandtrazodone 50 mg prn  insomnia.Next visit with in3 months.The plan was discussed with patient who had an opportunity to ask questions and these were all answered. I spend60minutes inphone consultationwith the patient.   12m, MD 06/18/2020, 9:01 AM

## 2020-06-19 ENCOUNTER — Telehealth (HOSPITAL_COMMUNITY): Payer: BC Managed Care – PPO | Admitting: Psychiatry

## 2020-06-27 DIAGNOSIS — F411 Generalized anxiety disorder: Secondary | ICD-10-CM | POA: Diagnosis not present

## 2020-07-01 DIAGNOSIS — M25512 Pain in left shoulder: Secondary | ICD-10-CM | POA: Diagnosis not present

## 2020-07-01 DIAGNOSIS — M25511 Pain in right shoulder: Secondary | ICD-10-CM | POA: Diagnosis not present

## 2020-07-01 DIAGNOSIS — M545 Low back pain, unspecified: Secondary | ICD-10-CM | POA: Diagnosis not present

## 2020-07-01 DIAGNOSIS — M542 Cervicalgia: Secondary | ICD-10-CM | POA: Diagnosis not present

## 2020-07-03 DIAGNOSIS — Z1152 Encounter for screening for COVID-19: Secondary | ICD-10-CM | POA: Diagnosis not present

## 2020-07-25 DIAGNOSIS — F332 Major depressive disorder, recurrent severe without psychotic features: Secondary | ICD-10-CM | POA: Diagnosis not present

## 2020-07-29 ENCOUNTER — Telehealth (HOSPITAL_COMMUNITY): Payer: Self-pay | Admitting: *Deleted

## 2020-07-29 ENCOUNTER — Other Ambulatory Visit (HOSPITAL_COMMUNITY): Payer: Self-pay | Admitting: Psychiatry

## 2020-07-29 DIAGNOSIS — K600 Acute anal fissure: Secondary | ICD-10-CM | POA: Diagnosis not present

## 2020-07-29 DIAGNOSIS — R109 Unspecified abdominal pain: Secondary | ICD-10-CM | POA: Diagnosis not present

## 2020-07-29 MED ORDER — TRAZODONE HCL 50 MG PO TABS: 50.0000 mg | ORAL_TABLET | Freq: Every evening | ORAL | 2 refills | Status: DC | PRN

## 2020-07-29 NOTE — Telephone Encounter (Signed)
Pt called requesting refill of the Trazodone, last filled 06/18/20 for #30. Pt has an upcoming appointment on 09/10/20. Ok to fill?

## 2020-07-29 NOTE — Telephone Encounter (Signed)
Done

## 2020-08-04 DIAGNOSIS — Z1389 Encounter for screening for other disorder: Secondary | ICD-10-CM | POA: Diagnosis not present

## 2020-08-04 DIAGNOSIS — Z6825 Body mass index (BMI) 25.0-25.9, adult: Secondary | ICD-10-CM | POA: Diagnosis not present

## 2020-08-04 DIAGNOSIS — Z130 Encounter for screening for diseases of the blood and blood-forming organs and certain disorders involving the immune mechanism: Secondary | ICD-10-CM | POA: Diagnosis not present

## 2020-08-04 DIAGNOSIS — Z01419 Encounter for gynecological examination (general) (routine) without abnormal findings: Secondary | ICD-10-CM | POA: Diagnosis not present

## 2020-08-05 ENCOUNTER — Encounter: Payer: Self-pay | Admitting: Internal Medicine

## 2020-08-05 ENCOUNTER — Other Ambulatory Visit: Payer: Self-pay

## 2020-08-05 ENCOUNTER — Ambulatory Visit (INDEPENDENT_AMBULATORY_CARE_PROVIDER_SITE_OTHER): Payer: BC Managed Care – PPO | Admitting: Internal Medicine

## 2020-08-05 VITALS — BP 122/80 | HR 88 | Temp 98.1°F | Resp 16 | Ht 69.0 in | Wt 172.5 lb

## 2020-08-05 DIAGNOSIS — R251 Tremor, unspecified: Secondary | ICD-10-CM | POA: Diagnosis not present

## 2020-08-05 DIAGNOSIS — F419 Anxiety disorder, unspecified: Secondary | ICD-10-CM | POA: Diagnosis not present

## 2020-08-05 DIAGNOSIS — F32A Depression, unspecified: Secondary | ICD-10-CM

## 2020-08-05 NOTE — Progress Notes (Signed)
Pre visit review using our clinic review tool, if applicable. No additional management support is needed unless otherwise documented below in the visit note. 

## 2020-08-05 NOTE — Patient Instructions (Signed)
We are referring you to neurology  Please contact your psychiatrist and let him know about these symptoms.  If you get worse please do not hesitate to call me.  GO TO THE LAB : Get the blood work     GO TO THE FRONT DESK, PLEASE SCHEDULE YOUR APPOINTMENTS Come back for physical exam at your convenience

## 2020-08-05 NOTE — Progress Notes (Signed)
Subjective:    Patient ID: Tabitha Hawkins, female    DOB: 1996-06-05, 25 y.o.   MRN: [id number]  DOS:  08/05/2020 Type of visit - description: Acute visit 2 weeks ago, started to notice tremors at the hands, tremors are both at rest and during action.  She is not taking any new medications. Denies EtOH abuse, drinks perhaps twice a month. Smokes marijuana but no other substances Denies fever chills No chest pain no difficulty breathing No palpitations No unusual changes on her weight. No nausea, vomiting, diarrhea No headache or dizziness Some heartburn noted.  When asked, admits to some stress lately.    Review of Systems See above   Past Medical History:  Diagnosis Date  . ADHD (attention deficit hyperactivity disorder)   . H/O multiple concussions    seen by neuro  . Headache    frequent  . Short-term memory loss    cause from multiple concussions?    Past Surgical History:  Procedure Laterality Date  . BUNIONECTOMY    . KNEE ARTHROSCOPY Left 2016    Allergies as of 08/05/2020   No Known Allergies     Medication List       Accurate as of August 05, 2020  1:28 PM. If you have any questions, ask your nurse or doctor.        ARIPiprazole 10 MG tablet Commonly known as: ABILIFY Take 1 tablet (10 mg total) by mouth daily.   FLUoxetine 40 MG capsule Commonly known as: PROZAC Take 1 capsule (40 mg total) by mouth daily.   Kyleena 19.5 MG IUD Generic drug: levonorgestrel Kyleena 17.5 mcg/24 hrs (79yrs) 19.5mg  intrauterine device  Take 1 device by intrauterine route.   traZODone 50 MG tablet Commonly known as: DESYREL Take 1 tablet (50 mg total) by mouth at bedtime as needed for sleep.          Objective:   Physical Exam BP 122/80 (BP Location: Left Arm, Patient Position: Sitting, Cuff Size: Small)   Pulse 88   Temp 98.1 F (36.7 C) (Oral)   Resp 16   Ht 5\' 9"  (1.753 m)   Wt 172 lb 8 oz (78.2 kg)   SpO2 98%   BMI 25.47 kg/m  General:    Well developed, NAD, BMI noted. HEENT:  Normocephalic . Face symmetric, atraumatic Neck: No thyromegaly Lungs:  CTA B Normal respiratory effort, no intercostal retractions, no accessory muscle use. Heart: RRR,  no murmur.  Lower extremities: no pretibial edema bilaterally  Skin: Not pale. Not jaundice Neurologic:  alert & oriented X3.  Speech normal, voice normal, gait appropriate for age and unassisted DTR motor symmetric + Tremor noted at the hands, more pronounced during action than at rest. Psych--  Cognition and judgment appear intact.  Cooperative with normal attention span and concentration.  Behavior appropriate. No anxious or depressed appearing.      Assessment     Assessment Asthma PSYCH/NEURO --ADD- Saw a psychologist 2015, DX ADHD, inattentive type. Rx adderall  08-2013 --anxiety Depression: Admitted with s/i 12-2018 --Postconcussion syndrome: DX 05-2015, saw neuro- d/c college sports  --Neuropsychological evaluation 08/13/2015 referred by neurology, report reviewed, dx  ADD with hyperactivity; + sx of  postconcussion including headaches.    PLAN: Tremors: New onset of tremors in the context of taking Abilify, fluoxetine, trazodone chronically. Plan: CMP, CBC, TSH Refer to neurology Also tremors are no classic for tardive dyskinesia, she takes an antipsychotic, rec  to d/w psychiatry her tremors (I  do not like to stop Abilify without them knowing).  Depression: Managed elsewhere, PHQ-9 20, elevated, denies suicidal ideas. Recommend RTC for CPX at her convenience   This visit occurred during the SARS-CoV-2 public health emergency.  Safety protocols were in place, including screening questions prior to the visit, additional usage of staff PPE, and extensive cleaning of exam room while observing appropriate contact time as indicated for disinfecting solutions.

## 2020-08-06 LAB — COMPREHENSIVE METABOLIC PANEL
ALT: 8 U/L (ref 0–35)
AST: 10 U/L (ref 0–37)
Albumin: 4.2 g/dL (ref 3.5–5.2)
Alkaline Phosphatase: 57 U/L (ref 39–117)
BUN: 13 mg/dL (ref 6–23)
CO2: 23 mEq/L (ref 19–32)
Calcium: 9.3 mg/dL (ref 8.4–10.5)
Chloride: 104 mEq/L (ref 96–112)
Creatinine, Ser: 0.79 mg/dL (ref 0.40–1.20)
GFR: 105.29 mL/min (ref >60.00)
Glucose, Bld: 80 mg/dL (ref 70–99)
Potassium: 4 mEq/L (ref 3.5–5.1)
Sodium: 137 mEq/L (ref 135–145)
Total Bilirubin: 0.7 mg/dL (ref 0.2–1.2)
Total Protein: 7.2 g/dL (ref 6.0–8.3)

## 2020-08-06 LAB — CBC WITH DIFFERENTIAL/PLATELET
Basophils Absolute: 0.1 10*3/uL (ref 0.0–0.1)
Basophils Relative: 0.8 % (ref 0.0–3.0)
Eosinophils Absolute: 0.2 10*3/uL (ref 0.0–0.7)
Eosinophils Relative: 1.7 % (ref 0.0–5.0)
HCT: 41.4 % (ref 36.0–46.0)
Hemoglobin: 14.2 g/dL (ref 12.0–15.0)
Lymphocytes Relative: 30.1 % (ref 12.0–46.0)
Lymphs Abs: 3 10*3/uL (ref 0.7–4.0)
MCHC: 34.4 g/dL (ref 30.0–36.0)
MCV: 95.8 fl (ref 78.0–100.0)
Monocytes Absolute: 0.9 10*3/uL (ref 0.1–1.0)
Monocytes Relative: 9.1 % (ref 3.0–12.0)
Neutro Abs: 5.8 10*3/uL (ref 1.4–7.7)
Neutrophils Relative %: 58.3 % (ref 43.0–77.0)
Platelets: 253 10*3/uL (ref 150.0–400.0)
RBC: 4.32 Mil/uL (ref 3.87–5.11)
RDW: 13.5 % (ref 11.5–15.5)
WBC: 9.9 10*3/uL (ref 4.0–10.5)

## 2020-08-06 LAB — TSH: TSH: 1.52 u[IU]/mL (ref 0.35–4.50)

## 2020-08-06 NOTE — Assessment & Plan Note (Signed)
Tremors: New onset of tremors in the context of taking Abilify, fluoxetine, trazodone chronically. Plan: CMP, CBC, TSH Refer to neurology Also tremors are no classic for tardive dyskinesia, she takes an antipsychotic, rec  to d/w psychiatry her tremors (I do not like to stop Abilify without them knowing).  Depression: Managed elsewhere, PHQ-9 20, elevated, denies suicidal ideas. Recommend RTC for CPX at her convenience

## 2020-08-07 ENCOUNTER — Other Ambulatory Visit: Payer: Self-pay

## 2020-08-07 ENCOUNTER — Telehealth (INDEPENDENT_AMBULATORY_CARE_PROVIDER_SITE_OTHER): Payer: BC Managed Care – PPO | Admitting: Psychiatry

## 2020-08-07 DIAGNOSIS — F411 Generalized anxiety disorder: Secondary | ICD-10-CM | POA: Diagnosis not present

## 2020-08-07 DIAGNOSIS — F1021 Alcohol dependence, in remission: Secondary | ICD-10-CM

## 2020-08-07 DIAGNOSIS — F1210 Cannabis abuse, uncomplicated: Secondary | ICD-10-CM | POA: Diagnosis not present

## 2020-08-07 DIAGNOSIS — F3341 Major depressive disorder, recurrent, in partial remission: Secondary | ICD-10-CM | POA: Diagnosis not present

## 2020-08-07 MED ORDER — ARIPIPRAZOLE 5 MG PO TABS: 5.0000 mg | ORAL_TABLET | Freq: Every day | ORAL | 0 refills | Status: DC

## 2020-08-07 NOTE — Progress Notes (Signed)
BH MD/PA/NP OP Progress Note  08/07/2020 2:11 PM Tabitha Hawkins  MRN:  [id number] Interview was conducted by phone and I verified that I was speaking with the correct person using two identifiers. I discussed the limitations of evaluation and management by telemedicine and  the availability of in person appointments. Patient expressed understanding and agreed to proceed. Participants in the visit: patient (location - home); physician (location - home office).  Chief Complaint: New onset tremor.  HPI: 24yo single white female with hx of depression/anxietyand past dx of ADD. In July 2020 hermood declined to the point that she started to feel suicidal and cut her right wrist. She was admitted to [facility] on 12/21/18 and stayed for 5 days. 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 She admits to having previous SI but never attempted suicide before. She was prescribed fluoxetine 20 mg by her PCP. Dose of fluoxetine was doubled during this hospitalization and her mood has quickly improved. In mid November her anxiety and depression increased (no clear trigger) and she started to drink alcohol excessively - up to 12 pack of beer daily. Her cannabis use also increased to several per day. She eventually came to [facility] as a walk in but was not admitted and instead referred to CD-IOP. She participated in 3 sessions then stopped. She now only drinks alcoholon occasion, none recently.Heruse of marijuana decreased toless than once a week. Tabitha Hawkins been under some financial stress lately, started to feel hopeless and eventually suicidal. She went to [facility] for help and was admitted on 11/3 and stayed there till 04/27/20. Interestingly enough, admission note states that she reported drinking 5 days a week and smoking cannabis daily - which she denies doing (see above). On admission her BAL was >10 while she  tested positive for THC. She was continued on Prozac 40mg  while aripiprazole dose was increased to 10 mg. Her SI resolved rapidly and she reports feeling less depressed. 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 She is not drinking alcohol and only sporadically smoking marijuana.  Few weeks ago she started to experience tremor which waxes and weans. Most likely it is a side effect of aripiprazole.   Visit Diagnosis:    ICD-10-CM   1. GAD (generalized anxiety disorder)  F41.1   2. Cannabis use disorder, mild, abuse  F12.10   3. Major depressive disorder, recurrent episode, in partial remission (HCC)  F33.41   4. Alcohol use disorder, moderate, in early remission (HCC)  F10.21     Past Psychiatric History: Please see intake H&P.  Past Medical History:  Past Medical History:  Diagnosis Date  . ADHD (attention deficit hyperactivity disorder)   . H/O multiple concussions    seen by neuro  . Headache    frequent  . Short-term memory loss    cause from multiple concussions?    Past Surgical History:  Procedure Laterality Date  . BUNIONECTOMY    . KNEE ARTHROSCOPY Left 2016    Family Psychiatric History: Reviewed.  Family History:  Family History  Adopted: Yes  Problem Relation Age of Onset  . Drug abuse Mother     Social History:  Social History   Socioeconomic History  . Marital status: Single    Spouse name: Not on file  . Number of children: 0  . Years of education: Not on file  .  Highest education level: Not on file  Occupational History  . Occupation: [employer]  Tobacco Use  . Smoking status: Never Smoker  . Smokeless tobacco: Never Used  Vaping Use  . Vaping Use: Every day  . Substances: Nicotine  Substance and Sexual Activity  . Alcohol use: Yes    Comment: occasionally  . Drug use: Yes    Types: Marijuana  . Sexual activity: Yes    Birth control/protection: Implant  Other Topics Concern  . Not on file  Social History  Narrative   Household: pt and boyfriend   Stopped college [employer], [place] d/t a concussion, couldn't play volley-ball 05-2015       Social Determinants of Health   Financial Resource Strain: Not on file  Food Insecurity: Not on file  Transportation Needs: Not on file  Physical Activity: Not on file  Stress: Not on file  Social Connections: Not on file    Allergies: No Known Allergies  Metabolic Disorder Labs: Lab Results  Component Value Date   HGBA1C 4.6 (L) 04/24/2020   MPG 85.32 04/24/2020   MPG 76.71 12/22/2018   No results found for: PROLACTIN Lab Results  Component Value Date   CHOL 202 (H) 04/24/2020   TRIG 83 04/24/2020   HDL 98 04/24/2020   CHOLHDL 2.1 04/24/2020   VLDL 17 04/24/2020   LDLCALC 87 04/24/2020   LDLCALC 83 12/22/2018   Lab Results  Component Value Date   TSH 1.52 08/05/2020   TSH 2.978 04/24/2020    Therapeutic Level Labs: No results found for: LITHIUM No results found for: VALPROATE No components found for:  CBMZ  Current Medications: Current Outpatient Medications  Medication Sig Dispense Refill  . ARIPiprazole (ABILIFY) 5 MG tablet Take 1 tablet (5 mg total) by mouth daily. 30 tablet 0  . FLUoxetine (PROZAC) 40 MG capsule Take 1 capsule (40 mg total) by mouth daily. (Patient not taking: Reported on 08/05/2020) 90 capsule 1  . Levonorgestrel (KYLEENA) 19.5 MG IUD Kyleena 17.5 mcg/24 hrs (84yrs) 19.5mg  intrauterine device  Take 1 device by intrauterine route.    . traZODone (DESYREL) 50 MG tablet Take 1 tablet (50 mg total) by mouth at bedtime as needed for sleep. 30 tablet 2   No current facility-administered medications for this visit.     Psychiatric Specialty Exam: Review of Systems  Neurological: Positive for tremors.  All other systems reviewed and are negative.   There were no vitals taken for this visit.There is no height or weight on file to calculate BMI.  General Appearance: NA  Eye Contact:  NA  Speech:  Clear  and Coherent and Normal Rate  Volume:  Normal  Mood:  Euthymic  Affect:  NA  Thought Process:  Goal Directed  Orientation:  Full (Time, Place, and Person)  Thought Content: Logical   Suicidal Thoughts:  No  Homicidal Thoughts:  No  Memory:  Immediate;   Good Recent;   Good Remote;   Good  Judgement:  Good  Insight:  Good  Psychomotor Activity:  Tremor  Concentration:  Concentration: Good  Recall:  Good  Fund of Knowledge: Good  Language: Good  Akathisia:  Negative  Handed:  Right  AIMS (if indicated): not done  Assets:  Communication Skills Desire for Improvement Financial Resources/Insurance Housing Social Support Talents/Skills  ADL's:  Intact  Cognition: WNL  Sleep:  Good   Screenings: AIMS   Flowsheet Row [facility] (Discharged) [facility] Ad[facility] (Discharged[facility]  AIMS Total Score 0 0    AUDIT   Flowsheet Row Admission (Discharged) from [facility] Counselor from [facility] Admission (Discharged) [facility]  Alcohol Use Disorder Identification Test Final Score (AUDIT) 5 24 0    GAD-7   Flowsheet Row Counselor from 06/04/2019 in [facility]  Total GAD-7 Score 6    PHQ2-9   Flowsheet Row Office Visit from 08/05/2020 in [facility] Counselor from 06/04/2019 in [facility] Office Visit from 05/02/2019 in [facility] Office Visit from 05/11/2018 in [facility]  PHQ-2 Total Score 4 2 2  0  PHQ-9 Total Score 20 10 9  0    Flowsheet Row Admission (Discharged) from OP [facility] Admission (Discharged) from [facility]  C-SSRS RISK CATEGORY High Risk High Risk       Assessment and Plan: 24yo single white female with hx of depression/anxietyand past dx of ADD. In July 2020 hermood declined to the point that she started to feel suicidal and cut her right wrist. She was admitted to [facility] on 12/21/18 and stayed for 5 days. 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 She admits to having previous SI but never attempted suicide before. She was prescribed fluoxetine 20 mg by her PCP. Dose of fluoxetine was doubled during this hospitalization and her mood has quickly improved. In mid November her anxiety and depression increased (no clear trigger) and she started to drink alcohol excessively - up to 12 pack of beer daily. Her cannabis use also increased to several per day. She eventually came to [facility] as a walk in but was not admitted and instead referred to [facility]. She participated in 3 sessions then stopped. She now only drinks alcoholon occasion, none recently.Heruse of marijuana decreased toless than once a week. December been under some financial stress lately, started to feel hopeless and eventually suicidal. She went to [facility] for help and was admitted on 11/3 and stayed there till 04/27/20. Interestingly enough, admission note states that she reported drinking 5 days a week and smoking cannabis daily - which she denies doing (see above). On admission her BAL was >10 while she tested positive for THC. She was continued on Prozac 40mg  while aripiprazole dose was increased to 10 mg. Her SI resolved rapidly and she reports feeling less depressed. 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 She is not drinking alcohol and only sporadically smoking  marijuana.  Few weeks ago she started to experience tremor which waxes and weans. Most likely it is a side effect of aripiprazole.  Dx: GAD; MDD recurrent,in partial remission; Alcohol use disorder in early remission; Cannabis use disorder, mild; ADHD by hx  Plan:Continue40 mg fluoxetineandtrazodone 50 mgprn insomnia.She will start taking 1/2 tablet of aripiprazole = 5 mg, but if tremor does not resolve in 7-8 days she will stop it altogether. Next visit with in1 month.The plan was discussed with patient who had an opportunity to ask questions and these were  all answered. I spend6715minutes inphone consultationwith the patient.    Magdalene Patricialgierd A Adrianah Prophete, MD 08/07/2020, 2:11 PM

## 2020-08-12 NOTE — Progress Notes (Signed)
Assessment/Plan:   1.  Tremor  -I agree with her psychiatrist that this is likely a result of the aripiprazole, but certainly could be driven by anxiety as well.  Her psychiatrist has already decreased her aripiprazole dose and told her she could stop it.  Discussed with patient that tremor induced by antipsychotics can last up to 6 months after that antipsychotic medication has been withdrawn, although I would doubt that this would be the case in her.  2.  Memory change  -mom asks about this in relation to post concussion.  She saw my partner about this years ago as well.  Mom would like to get patient some therapy.  We discussed neurocog testing and pt wants to think about it.  My suspicion is that it will show GAD/depression affecting memory and that potentially counseling could be of value, coupled with her psychiatric care.  Happy to refer to [facility] for the counseling.  They will let us know.  3.  F/u prn  Subjective:   Tabitha Hawkins was seen today in the movement disorders clinic for neurologic consultation at the request of Wanda Plump, MD.  The consultation is for the evaluation of tremor.  Outside records that were made available to me were reviewed.  This patient is accompanied in the office by her mother who supplements the history. Patient talk to primary care about tremor and was told to see me, but also follow-up with psychiatry.  She did a phone visit with her psychiatrist on February 17.  Those notes are reviewed.  It was felt that her tremor was a side effect of the aripiprazole and her dosage was decreased from 10 mg to 5 mg.  She was told that if her tremor does not resolve in 7 to 8 days to stop the aripiprazole altogether.  She has not noted any change yet  Tremor: Yes.     How long has it been going on? 2-3 weeks  At rest or with activation?  both  When is it noted the most?  Nighttime/evening (3-4 pm)  Fam hx of tremor?  Unknown - she is adopted  Located  where?  Bilateral UE (but its better today)  Affected by caffeine:  No.  Affected by alcohol:  No.  Affected by stress:  Yes.    Spills soup if on spoon:  Yes.    Tremor inducing meds:  Yes.   abilify, increased in November after being admitted to [facility]  Other Specific Symptoms:  Voice: no change Postural symptoms:  Yes.  x 2 weeks but "my balance is always terrible though" Bradykinesia symptoms: no bradykinesia noted Loss of smell:  No. Loss of taste:  No. Urinary Incontinence:  No. Difficulty Swallowing:  No. Handwriting, micrographia: No. Depression:  Yes.   and anxious Memory changes:  Yes.  , trouble with short and long term with multiple concussions (mom worries about that and wonders if therapy could help that) N/V:  No. Lightheaded:  Yes.    Syncope: Yes.   , last time 4 years ago at [facility] when out in heat.  Had one episode of syncope in shower. Diplopia:  "mostly blurry."    ALLERGIES:  No Known Allergies  CURRENT MEDICATIONS:  Current Outpatient Medications  Medication Instructions  . ARIPiprazole (ABILIFY) 5 mg, Oral, Daily  . FLUoxetine (PROZAC) 40 mg, Oral, Daily  . Levonorgestrel (KYLEENA) 19.5 MG IUD Kyleena 17.5 mcg/24 hrs (52yrs) 19.5mg  intrauterine device  Take 1 device by intrauterine route.  . traZODone (DESYREL) 50 mg, Oral, At bedtime PRN    Objective:   PHYSICAL EXAMINATION:    VITALS:   Vitals:   08/14/20 0839  BP: 98/65  Pulse: 85  SpO2: 98%  Weight: 173 lb (78.5 kg)  Height: 5\' 9"  (1.753 m)    GEN:  The patient appears stated age and is in NAD. HEENT:  Normocephalic, atraumatic.  The mucous membranes are moist. The superficial temporal arteries are without ropiness or tenderness. CV:  RRR Lungs:  CTAB Neck/HEME:  There are no carotid bruits bilaterally.  Neurological examination:  Orientation: The patient is alert and oriented x3.  Cranial nerves: There is good facial symmetry.  Extraocular muscles  are intact. The visual fields are full to confrontational testing. The speech is fluent and clear. Soft palate rises symmetrically and there is no tongue deviation. Hearing is intact to conversational tone. Sensation: Sensation is intact to light touch throughout (facial, trunk, extremities). Vibration is intact at the bilateral big toe. There is no extinction with double simultaneous stimulation.  Motor: Strength is 5/5 in the bilateral upper and lower extremities.   Shoulder shrug is equal and symmetric.  There is no pronator drift. Deep tendon reflexes: Deep tendon reflexes are 2/4 at the bilateral biceps, triceps, brachioradialis, patella and achilles. Plantar responses are downgoing bilaterally.  Movement examination: Tone: There is nl tone in the bilateral upper extremities.  The tone in the lower extremities is nl.  Abnormal movements: none.  There is mild postural tremor.  Able to draw Archimedes spirals without difficulty.  She is tremulous when pouring water from 1 glass to another but does not really spill it. Coordination:  There is no decremation with RAM's, with any form of RAMS, including alternating supination and pronation of the forearm, hand opening and closing, finger taps, heel taps and toe taps. Gait and Station: The patient has no difficulty arising out of a deep-seated chair without the use of the hands. The patient's stride length is good.  She ambulates in a tandem fashion.  She is able to stand in the Romberg position.  She has good arm swing.  She is able to heel toe walk. I have reviewed and interpreted the following labs independently   Chemistry      Component Value Date/Time   NA 137 08/05/2020 1355   K 4.0 08/05/2020 1355   CL 104 08/05/2020 1355   CO2 23 08/05/2020 1355   BUN 13 08/05/2020 1355   CREATININE 0.79 08/05/2020 1355      Component Value Date/Time   CALCIUM 9.3 08/05/2020 1355   ALKPHOS 57 08/05/2020 1355   AST 10 08/05/2020 1355   ALT 8  08/05/2020 1355   BILITOT 0.7 08/05/2020 1355      Lab Results  Component Value Date   TSH 1.52 08/05/2020   Lab Results  Component Value Date   WBC 9.9 08/05/2020   HGB 14.2 08/05/2020   HCT 41.4 08/05/2020   MCV 95.8 08/05/2020   PLT 253.0 08/05/2020      Total time spent on today's visit was 45 minutes, including both face-to-face time and nonface-to-face time.  Time included that spent on review of records (prior notes available to me/labs/imaging if pertinent), discussing treatment and goals, answering patient's questions and coordinating care.  Cc:  08/07/2020, MD

## 2020-08-14 ENCOUNTER — Other Ambulatory Visit: Payer: Self-pay

## 2020-08-14 ENCOUNTER — Encounter: Payer: Self-pay | Admitting: Neurology

## 2020-08-14 ENCOUNTER — Ambulatory Visit: Payer: BC Managed Care – PPO | Admitting: Neurology

## 2020-08-14 VITALS — BP 98/65 | HR 85 | Ht 69.0 in | Wt 173.0 lb

## 2020-08-14 DIAGNOSIS — F411 Generalized anxiety disorder: Secondary | ICD-10-CM

## 2020-08-14 DIAGNOSIS — R413 Other amnesia: Secondary | ICD-10-CM

## 2020-08-14 DIAGNOSIS — G251 Drug-induced tremor: Secondary | ICD-10-CM

## 2020-08-14 NOTE — Patient Instructions (Signed)
1.  I think that your tremor is from the abilify.  Talk with your psychiatrist about this medication but don't stop it without consent of your physician.  Remember that anxiety can make this worse as well  2.  If you decide to proceed with neurocognitive testing, here is what you need to know:  This is a test for for evaluation of memory and thinking abilities. Please bring someone with you to this appointment if possible, as it is helpful for the neuropsychologist to hear from both you and another adult who knows you well. Please bring eyeglasses and hearing aids if you wear them.    The evaluation will take approximately 3 hours and has two parts:   . The first part is a clinical interview with the neuropsychologist, Dr. Milbert Coulter or Dr. Roseanne Reno. During the interview, the neuropsychologist will speak with you and the individual you brought to the appointment.    . The second part of the evaluation is testing with the doctor's technician, aka psychometrician, Annabelle Harman or [employer]. During the testing, the technician will ask you to remember different types of material, solve problems, and answer some questionnaires. Your family member will not be present for this portion of the evaluation.   Please note: We have to reserve several hours of the neuropsychologist's time and the psychometrician's time for your evaluation appointment. As such, there is a No-Show fee of $100. If you are unable to attend any of your appointments, please contact our office as soon as possible to reschedule.

## 2020-08-21 ENCOUNTER — Other Ambulatory Visit: Payer: Self-pay

## 2020-08-21 ENCOUNTER — Telehealth (INDEPENDENT_AMBULATORY_CARE_PROVIDER_SITE_OTHER): Payer: BC Managed Care – PPO | Admitting: Psychiatry

## 2020-08-21 DIAGNOSIS — F411 Generalized anxiety disorder: Secondary | ICD-10-CM | POA: Diagnosis not present

## 2020-08-21 DIAGNOSIS — F1210 Cannabis abuse, uncomplicated: Secondary | ICD-10-CM

## 2020-08-21 DIAGNOSIS — F330 Major depressive disorder, recurrent, mild: Secondary | ICD-10-CM | POA: Diagnosis not present

## 2020-08-21 DIAGNOSIS — F1021 Alcohol dependence, in remission: Secondary | ICD-10-CM

## 2020-08-21 MED ORDER — FLUOXETINE HCL 20 MG PO CAPS: 60.0000 mg | ORAL_CAPSULE | Freq: Every day | ORAL | 2 refills | Status: DC

## 2020-08-21 MED ORDER — ARIPIPRAZOLE 5 MG PO TABS: 5.0000 mg | ORAL_TABLET | Freq: Every day | ORAL | 0 refills | Status: DC

## 2020-08-21 NOTE — Progress Notes (Signed)
BH MD/PA/NP OP Progress Note  08/21/2020 9:10 AM Tabitha Hawkins  MRN:  [id number] Interview was conducted by phone and I verified that I was speaking with the correct person using two identifiers. I discussed the limitations of evaluation and management by telemedicine and  the availability of in person appointments. Patient expressed understanding and agreed to proceed. Participants in the visit: patient (location - home); physician (location - home office).  Chief Complaint: Some depression, tremor.  HPI: 24yo single white female with hx of depression/anxietyand past dx of ADD. In July 2020 hermood declined to the point that she started to feel suicidal and cut her right wrist. She was admitted to [facility] on 12/21/18 and stayed for 5 days. She associates onset of depression with her having to leave college (was at [employer]) and end of her volleyball career due to recurrent concussions. She admits to having previous SI but never attempted suicide before. She was prescribed fluoxetine 20 mg by her PCP. Dose of fluoxetine was doubled during this hospitalization and her mood has quickly improved. In mid November her anxiety and depression increased (no clear trigger) and she started to drink alcohol excessively - up to 12 pack of beer daily. Her cannabis use also increased to several per day. She eventually came to [facility] as a walk in but was not admitted and instead referred to CD-IOP. She participated in 3 sessions then stopped. She now only drinks alcoholon occasion, none recently.Heruse of marijuana decreased toless than once a week. Tabitha Hawkins been under some financial stress lately, started to feel hopeless and eventually suicidal. She went to [facility] for help and was admitted on 11/3 and stayed there till 04/27/20. Interestingly enough, admission note states that she reported drinking 5 days a week and smoking cannabis daily - which she denies doing (see above). On admission her BAL was >10 while she  tested positive for THC. She was continued on Prozac 40mg  while aripiprazole dose was increased to 10 mg. Her SI resolved rapidly and she reported feeling less depressed. She lives with supportive boyfriend and works as a at [employer], site. She is not drinking alcohol and only sporadically smoking marijuana. Few weeks ago she started to experience tremor which waxes and weans. Most likely it is a side effect of aripiprazole. We have decreased dose to 5 mg and it subsided although did not go away completely. Her mood remains mildly depressed. Of note is that she did not experience tremor while on fluoxetine 40 mg alone (aripiprazole was added a year ago). Tabitha Hawkins also saw a neurologist Dr. Denny Peon and knows that tremor may persist for a few months after antipsychotic is discontinued (she so far decided not to stop aripiprazole).    Visit Diagnosis:    ICD-10-CM   1. Major depressive disorder, recurrent episode, mild (HCC)  F33.0   2. GAD (generalized anxiety disorder)  F41.1   3. Alcohol use disorder, moderate, in early remission (HCC)  F10.21   4. Cannabis use disorder, mild, abuse  F12.10     Past Psychiatric History: Please see intake H&P.  Past Medical History:  Past Medical History:  Diagnosis Date  . ADHD (attention deficit hyperactivity disorder)   . H/O multiple concussions    seen by neuro  . Headache    frequent  . Short-term memory loss    cause from multiple concussions?    Past Surgical History:  Procedure Laterality Date  . BUNIONECTOMY    . KNEE ARTHROSCOPY Left 2016  Family Psychiatric History: Reviewed.  Family History:  Family History  Adopted: Yes  Problem Relation Age of Onset  . Drug abuse Mother     Social History:  Social History   Socioeconomic History  . Marital status: Single    Spouse name: Not on file  . Number of children: 0  . Years of education: Not on file  . Highest education level: Not on file  Occupational History  . Occupation:  [employer]  Tobacco Use  . Smoking status: Never Smoker  . Smokeless tobacco: Never Used  Vaping Use  . Vaping Use: Every day  . Substances: Nicotine  Substance and Sexual Activity  . Alcohol use: Yes    Comment: occasionally  . Drug use: Yes    Types: Marijuana  . Sexual activity: Yes    Birth control/protection: Implant  Other Topics Concern  . Not on file  Social History Narrative   Household: pt and boyfriend   Stopped college [employer], [place] d/t a concussion, couldn't play volley-ball 05-2015       Social Determinants of Health   Financial Resource Strain: Not on file  Food Insecurity: Not on file  Transportation Needs: Not on file  Physical Activity: Not on file  Stress: Not on file  Social Connections: Not on file    Allergies: No Known Allergies  Metabolic Disorder Labs: Lab Results  Component Value Date   HGBA1C 4.6 (L) 04/24/2020   MPG 85.32 04/24/2020   MPG 76.71 12/22/2018   No results found for: PROLACTIN Lab Results  Component Value Date   CHOL 202 (H) 04/24/2020   TRIG 83 04/24/2020   HDL 98 04/24/2020   CHOLHDL 2.1 04/24/2020   VLDL 17 04/24/2020   LDLCALC 87 04/24/2020   LDLCALC 83 12/22/2018   Lab Results  Component Value Date   TSH 1.52 08/05/2020   TSH 2.978 04/24/2020    Therapeutic Level Labs: No results found for: LITHIUM No results found for: VALPROATE No components found for:  CBMZ  Current Medications: Current Outpatient Medications  Medication Sig Dispense Refill  . FLUoxetine (PROZAC) 20 MG capsule Take 3 capsules (60 mg total) by mouth daily. 90 capsule 2  . [START ON 09/05/2020] ARIPiprazole (ABILIFY) 5 MG tablet Take 1 tablet (5 mg total) by mouth daily. 30 tablet 0  . Levonorgestrel (KYLEENA) 19.5 MG IUD Kyleena 17.5 mcg/24 hrs (73yrs) 19.5mg  intrauterine device  Take 1 device by intrauterine route.    . traZODone (DESYREL) 50 MG tablet Take 1 tablet (50 mg total) by mouth at bedtime as needed for sleep. 30 tablet 2    No current facility-administered medications for this visit.      Psychiatric Specialty Exam: Review of Systems  Neurological: Positive for tremors.  Psychiatric/Behavioral: The patient is nervous/anxious.   All other systems reviewed and are negative.   There were no vitals taken for this visit.There is no height or weight on file to calculate BMI.  General Appearance: NA  Eye Contact:  NA  Speech:  Clear and Coherent and Normal Rate  Volume:  Normal  Mood:  Anxious and Depressed  Affect:  NA  Thought Process:  Goal Directed and Linear  Orientation:  Full (Time, Place, and Person)  Thought Content: Logical   Suicidal Thoughts:  No  Homicidal Thoughts:  No  Memory:  Immediate;   Fair Recent;   Fair Remote;   Fair  Judgement:  Good  Insight:  Good  Psychomotor Activity:  NA  Concentration:  Concentration: Fair  Recall:  FiservFair  Fund of Knowledge: Good  Language: Good  Akathisia:  Negative  Handed:  Right  AIMS (if indicated): not done  Assets:  Communication Skills Desire for Improvement Financial Resources/Insurance Housing Social Support Talents/Skills  ADL's:  Intact  Cognition: WNL  Sleep:  Fair   Screenings: AIMS   Flowsheet Row Ad[facility] (Discharged) [facility] (Discharged) [facility]  AIMS Total Score 0 0    AUDIT   Flowsheet Row Admission (Discharged) from [facility] Counselor from [facility] Ad[facility] (Discharged) [facility]  Alcohol Use Disorder Identification Test Final Score (AUDIT) 5 24 0    GAD-7   Flowsheet Row Counselor from 06/04/2019 [facility]  Total GAD-7 Score 6    PHQ2-9   Flowsheet Row [facility] Counselor from 06/04/2019 [facility]  PHQ-2 Total Score 4 2 2  0  PHQ-9 Total Score 20 10 9  0    Flowsheet Row Admission (Discharged) from [facility] Admission (Discharged) [facility]  C-SSRS RISK CATEGORY High Risk High Risk       Assessment and Plan: 24yo single white female with hx of depression/anxietyand past dx of ADD. In July 2020 hermood declined to the point that she started to feel suicidal and cut her right wrist. She was [facility] and stayed for 5 days. She associates onset of depression with her having to leave college (was at [employer]) and end of her volleyball career due to recurrent concussions. She admits to having previous SI but never attempted suicide before. She was prescribed fluoxetine 20 mg by her PCP. Dose of fluoxetine was doubled during this hospitalization and her mood has quickly improved. In mid November her anxiety and depression increased (no clear trigger) and she started to drink alcohol excessively - up to 12 pack of beer daily. Her cannabis use also increased to several per day. [facility] but was not admitted and instead referred to [facility]. She participated in 3 sessions then stopped. She now only drinks alcoholon occasion, none recently.Heruse of marijuana decreased toless than once a week. Tabitha NielsenShehas been under some financial stress lately, started to feel hopeless and eventually suicidal. [facility] and was [facility] on 11/3 and stayed there till 04/27/20. Interestingly enough, admission note states that she  reported drinking 5 days a week and smoking cannabis daily - which she denies doing (see above). On admission her BAL was >10 while she tested positive for THC. She was continued on Prozac 40mg  while aripiprazole dose was increased to 10 mg. Her SI resolved rapidly and she reported feeling less depressed. She lives with supportive boyfriend and works as a Engineer, [employer] at [employer]. She is not drinking alcohol and only sporadically smoking marijuana. Few weeks ago she started to experience tremor which waxes and weans. Most likely it is a side effect of  aripiprazole. We have decreased dose to 5 mg and it subsided although did not go away completely. Her mood remains mildly depressed. Of note is that she did not experience tremor while on fluoxetine 40 mg alone (aripiprazole was added a year ago). Merrissa also saw a neurologist Dr. Drue Novel and knows that tremor may persist for a few months after antipsychotic is discontinued (she so far decided not to stop aripiprazole).  Dx: GAD; MDD recurrent,mild; Alcohol use disorder in early remission; Cannabis use disorder, mild; ADHD by hx  Plan:We will try increasing fluoxetine to 60 mg and I suggested that she stops aripiprazole. Continuetrazodone 50 mgprn insomnia.Next visit with a new provider in 5-6 weeks.The plan was discussed with patient who had an opportunity to ask questions and these were all answered. I spend2minutes inphone consultationwith the patient.   Magdalene Patricia, MD 08/21/2020, 9:10 AM

## 2020-08-25 ENCOUNTER — Telehealth: Payer: Self-pay | Admitting: *Deleted

## 2020-08-25 DIAGNOSIS — J4521 Mild intermittent asthma with (acute) exacerbation: Secondary | ICD-10-CM | POA: Diagnosis not present

## 2020-08-25 NOTE — Telephone Encounter (Signed)
Spoke with patient and she went to urgent care this morning.  She has bronchitis and they gave her an antibiotic and inhaler.  Advised to call her [place] if she needs any follow up.

## 2020-08-25 NOTE — Telephone Encounter (Signed)
  Who Is Calling Patient / Member / Family / Caregiver Call Type Triage / Clinical Relationship To Patient Self Return Phone Number [phone] (Primary) Chief Complaint WHEEZING Reason for Call Request to Schedule Office Appointment Initial Comment Caller states she is wheezing , cough, nasal congestion GOTO Facility Not Listed [facility] Urgent care. Translation No Nurse Assessment Nurse: Suezanne Jacquet, RN, Riley Lam Date/Time (Eastern Time): 08/25/2020 7:45:52 AM Confirm and document reason for call. If symptomatic, describe symptoms. ---Caller states she is wheezing , cough, nasal congestion. Does the patient have any new or worsening symptoms? ---Yes Will a triage be completed? ---Yes Related visit to physician within the last 2 weeks? ---No Does the PT have any chronic conditions? (i.e. diabetes, asthma, this includes High risk factors for pregnancy, etc.) ---Yes List chronic conditions. ---asthma Is the patient pregnant or possibly pregnant? (Ask all females between the ages of 60-55) ---No Is this a behavioral health or substance abuse call? ---No Guidelines Guideline Title Affirmed Question Affirmed Notes Nurse Date/Time (Eastern Time) Asthma Attack [1] MODERATE asthma attack (e.g., SOB at rest, speaks in phrases, audible wheezes) AND [2] doesn't have neb or inhaler available Camillia Herter 08/25/2020 7:46:33 AM Disp. Time Lamount Cohen Time) Disposition Final User 08/25/2020 7:43:11 AM Send to Urgent Queue Pearletha Furl 08/25/2020 7:48:59 AM Go to ED Now Yes Suezanne Jacquet, RN, Riley Lam PLEASE NOTE: All timestamps contained within this report are represented as [place] Standard Time. CONFIDENTIALTY NOTICE: This fax transmission is intended only for the addressee. It contains information that is legally privileged, confidential or otherwise protected from use or disclosure. If you are not the intended recipient, you are strictly prohibited from reviewing, disclosing, copying using or  disseminating any of this information or taking any action in reliance on or regarding this information. If you have received this fax in error, please notify us immediately by telephone so that we can arrange for its return to [place]. Phone: [phone], Toll-Free: [phone], Fax: [phone] Page: 2 of 2 Call Id: [id number] Caller Disagree/Comply Comply Caller Understands Yes PreDisposition Did not know what to do Care Advice Given Per Guideline GO TO ED NOW: * Leave now. Drive carefully. * Another adult should drive. ANOTHER ADULT SHOULD DRIVE: CARE ADVICE given per Asthma Attack (Adult) guideline. Comments User: Cameron Proud, RN Date/Time Lamount Cohen Time): 08/25/2020 7:46:45 AM Symptoms began 3 days ago and no fever. User: Cameron Proud, RN Date/Time Lamount Cohen Time): 08/25/2020 7:49:47 AM Instead of ER caller states she plans to go to an Urgent care. [facility] Urgent care. Referrals GO TO FACILITY OTHER - SPECIFY

## 2020-09-05 ENCOUNTER — Telehealth (HOSPITAL_COMMUNITY): Payer: BC Managed Care – PPO | Admitting: Psychiatry

## 2020-09-10 ENCOUNTER — Telehealth (HOSPITAL_COMMUNITY): Payer: BC Managed Care – PPO | Admitting: Psychiatry

## 2020-09-15 ENCOUNTER — Encounter: Payer: Self-pay | Admitting: Internal Medicine

## 2020-09-30 DIAGNOSIS — T783XXD Angioneurotic edema, subsequent encounter: Secondary | ICD-10-CM | POA: Diagnosis not present

## 2020-09-30 DIAGNOSIS — J4520 Mild intermittent asthma, uncomplicated: Secondary | ICD-10-CM | POA: Diagnosis not present

## 2020-09-30 DIAGNOSIS — J301 Allergic rhinitis due to pollen: Secondary | ICD-10-CM | POA: Diagnosis not present

## 2020-09-30 DIAGNOSIS — L501 Idiopathic urticaria: Secondary | ICD-10-CM | POA: Diagnosis not present

## 2020-10-09 DIAGNOSIS — R0781 Pleurodynia: Secondary | ICD-10-CM | POA: Diagnosis not present

## 2020-10-09 DIAGNOSIS — S2020XA Contusion of thorax, unspecified, initial encounter: Secondary | ICD-10-CM | POA: Diagnosis not present

## 2020-10-13 DIAGNOSIS — R0781 Pleurodynia: Secondary | ICD-10-CM | POA: Diagnosis not present

## 2020-10-22 ENCOUNTER — Emergency Department (HOSPITAL_COMMUNITY): Payer: BC Managed Care – PPO

## 2020-10-22 ENCOUNTER — Emergency Department (HOSPITAL_COMMUNITY)
Admission: EM | Admit: 2020-10-22 | Discharge: 2020-10-23 | Disposition: A | Discharge status: Home / Self Care | Payer: BC Managed Care – PPO | Attending: Emergency Medicine | Admitting: Emergency Medicine

## 2020-10-22 ENCOUNTER — Other Ambulatory Visit: Payer: Self-pay

## 2020-10-22 DIAGNOSIS — R5383 Other fatigue: Secondary | ICD-10-CM | POA: Insufficient documentation

## 2020-10-22 DIAGNOSIS — R0602 Shortness of breath: Secondary | ICD-10-CM | POA: Diagnosis not present

## 2020-10-22 DIAGNOSIS — M7989 Other specified soft tissue disorders: Secondary | ICD-10-CM | POA: Insufficient documentation

## 2020-10-22 DIAGNOSIS — R251 Tremor, unspecified: Secondary | ICD-10-CM | POA: Diagnosis not present

## 2020-10-22 DIAGNOSIS — R410 Disorientation, unspecified: Secondary | ICD-10-CM

## 2020-10-22 DIAGNOSIS — R42 Dizziness and giddiness: Secondary | ICD-10-CM | POA: Diagnosis not present

## 2020-10-22 DIAGNOSIS — R000 Tachycardia, unspecified: Secondary | ICD-10-CM | POA: Diagnosis not present

## 2020-10-22 DIAGNOSIS — I959 Hypotension, unspecified: Secondary | ICD-10-CM

## 2020-10-22 DIAGNOSIS — R400 Somnolence: Secondary | ICD-10-CM | POA: Diagnosis not present

## 2020-10-22 DIAGNOSIS — R112 Nausea with vomiting, unspecified: Secondary | ICD-10-CM | POA: Diagnosis not present

## 2020-10-22 LAB — COMPREHENSIVE METABOLIC PANEL
ALT: 16 U/L (ref 0–44)
AST: 18 U/L (ref 15–41)
Albumin: 4.9 g/dL (ref 3.5–5.0)
Alkaline Phosphatase: 64 U/L (ref 38–126)
Anion gap: 11 (ref 5–15)
BUN: 9 mg/dL (ref 6–20)
CO2: 19 mmol/L — ABNORMAL LOW (ref 22–32)
Calcium: 9.7 mg/dL (ref 8.9–10.3)
Chloride: 106 mmol/L (ref 98–111)
Creatinine, Ser: 0.94 mg/dL (ref 0.44–1.00)
GFR, Estimated: >60 mL/min (ref >60)
Glucose, Bld: 99 mg/dL (ref 70–99)
Potassium: 3.7 mmol/L (ref 3.5–5.1)
Sodium: 136 mmol/L (ref 135–145)
Total Bilirubin: 0.8 mg/dL (ref 0.3–1.2)
Total Protein: 8.1 g/dL (ref 6.5–8.1)

## 2020-10-22 LAB — URINALYSIS, ROUTINE W REFLEX MICROSCOPIC
Bilirubin Urine: NEGATIVE
Glucose, UA: NEGATIVE mg/dL
Hgb urine dipstick: NEGATIVE
Ketones, ur: 20 mg/dL — AB
Leukocytes,Ua: NEGATIVE
Nitrite: NEGATIVE
Protein, ur: NEGATIVE mg/dL
Specific Gravity, Urine: 1.016 (ref 1.005–1.030)
pH: 5 (ref 5.0–8.0)

## 2020-10-22 LAB — CBC WITH DIFFERENTIAL/PLATELET
Abs Immature Granulocytes: 0.06 10*3/uL (ref 0.00–0.07)
Basophils Absolute: 0.1 10*3/uL (ref 0.0–0.1)
Basophils Relative: 0 %
Eosinophils Absolute: 0 10*3/uL (ref 0.0–0.5)
Eosinophils Relative: 0 %
HCT: 46.6 % — ABNORMAL HIGH (ref 36.0–46.0)
Hemoglobin: 16.1 g/dL — ABNORMAL HIGH (ref 12.0–15.0)
Immature Granulocytes: 0 %
Lymphocytes Relative: 12 %
Lymphs Abs: 1.7 10*3/uL (ref 0.7–4.0)
MCH: 32.9 pg (ref 26.0–34.0)
MCHC: 34.5 g/dL (ref 30.0–36.0)
MCV: 95.3 fL (ref 80.0–100.0)
Monocytes Absolute: 1 10*3/uL (ref 0.1–1.0)
Monocytes Relative: 7 %
Neutro Abs: 11.8 10*3/uL — ABNORMAL HIGH (ref 1.7–7.7)
Neutrophils Relative %: 81 %
Platelets: 377 10*3/uL (ref 150–400)
RBC: 4.89 MIL/uL (ref 3.87–5.11)
RDW: 12.1 % (ref 11.5–15.5)
WBC: 14.7 10*3/uL — ABNORMAL HIGH (ref 4.0–10.5)
nRBC: 0 % (ref 0.0–0.2)

## 2020-10-22 LAB — RAPID URINE DRUG SCREEN, HOSP PERFORMED
Amphetamines: NOT DETECTED
Barbiturates: NOT DETECTED
Benzodiazepines: POSITIVE — AB
Cocaine: NOT DETECTED
Opiates: NOT DETECTED
Tetrahydrocannabinol: POSITIVE — AB

## 2020-10-22 LAB — I-STAT BETA HCG BLOOD, ED (MC, WL, AP ONLY): I-stat hCG, quantitative: <5 m[IU]/mL (ref <5)

## 2020-10-22 LAB — LIPASE, BLOOD: Lipase: 26 U/L (ref 11–51)

## 2020-10-22 NOTE — ED Triage Notes (Signed)
Pt reports since Friday she's been shaking all over, confusion, and not walking straight. Sts it doesn't feel like she can take a full breath. Pt seems confused in triage, but states it is normal for her to have trouble remembering things because she's had eight concussions.

## 2020-10-22 NOTE — ED Provider Notes (Signed)
Emergency Medicine Provider Triage Evaluation Note  Tabitha Hawkins 25 y.o. female was evaluated in triage.  Pt complains of confusion, tremors, shortness of breath, abdominal pain, vomiting.  She reports that about 5 days ago, boyfriend started noticing that she was slightly confused.  She states that she felt like she was not walking straight.  She also reports she has some shortness of breath, generalized abdominal pain.  She had episode of vomiting this morning.  She does report has a history of trouble remembering things secondary to concussions.  She denies any chest pain, diarrhea, fevers.  Review of Systems  Positive: Confusion, SOB, abd pain, vomiting  Negative: Fever, CP   Physical Exam  BP 134/82   Pulse 70   Temp 98.2 F (36.8 C) (Oral)   Resp 18   Ht 5\' 4"  (1.626 m)   Wt 65.8 kg   SpO2 100%   BMI 24.89 kg/m  Gen:   Awake, no distress HEENT:  Atraumatic Resp:  Normal effort.  Able to speak in full sentences without difficulty.  No evidence of respiratory distress. Cardiac:  Normal rate  Abd:   Nondistended, nontender  MSK:   Moves extremities without difficulty Neuro:  Speech clear.  Slight tremors.  No facial droop.  Medical Decision Making  Medically screening exam initiated at 3:55 AM.  Appropriate orders placed.  Tabitha Hawkins was informed that the remainder of the evaluation will be completed by another provider, this initial triage assessment does not replace that evaluation, and the importance of remaining in the ED until their evaluation is complete.  Clinical Impression  Confusion, SOB, Abd pain   Portions of this note were generated with Dragon dictation software. Dictation errors may occur despite best attempts at proofreading.     Tabitha Life, PA-C 10/22/20 1602    12/22/20, MD 10/22/20 [phone]

## 2020-10-23 LAB — TROPONIN I (HIGH SENSITIVITY): Troponin I (High Sensitivity): <2 ng/L (ref <18)

## 2020-10-23 NOTE — Discharge Instructions (Addendum)
You have been seen and discharged from the emergency department.  Your blood work was normal, head CT showed no acute finding.  Your work-up was concerning for orthostatic hypotension.  This could be the cause for your syncope and dizziness when you tried to walk.  It was recommended that you get IV fluids and reassessment.  A lot of your symptoms including the low blood pressure may be due to to the medications you are on, specifically the Prozac and Desyrel.  Follow-up with your primary provider tomorrow for reevaluation, possible medication adjustment and further care. Take home medications as prescribed. If you have any worsening symptoms or further concerns for your health please return to an emergency department for further evaluation.  You have chosen to leave the emergency department AGAINST MEDICAL ADVICE.  I have explained to you your testing results and the need for further evaluation/admission.  You have verbalized understanding of these results and plan and are choosing to leave the hospital AGAINST MEDICAL ADVICE. You understand the risks associated with leaving without further evaluation/treatment.

## 2020-10-23 NOTE — ED Notes (Signed)
Pt tolerating PO challenge well

## 2020-10-23 NOTE — ED Provider Notes (Addendum)
[facility] EMERGENCY DEPARTMENT Provider Note   CSN: [id number] Arrival date & time: 10/22/20  1547     History No chief complaint on file.   Tabitha Hawkins is a 25 y.o. female.  HPI   25 year old female with past medical history of anxiety, depression, dyslexia, substance abuse, multiple concussions presents to the emergency department with multiple complaints.  Patient states for the past couple months she has been having intermittent tremor/shaking.  She been working with her psychiatrist and neurologist, they have been adjusting her psychiatric medication which has improved this.  Patient has confusion at baseline for multiple concussions but feels as if this has been more pronounced and worse over the past couple days.  She has felt fatigued, muscle aches, had 1 episode of nausea/vomiting.  She sometimes feels like she cannot get a full breath in but denies any acute shortness of breath or chest pain.  No headache/neck pain.  No recent fever or illness.  She endorses intermittent dizziness that usually occurs when she goes from sitting to standing/walking.  She has not been sleeping as well, denies any self-harm/SI/HI.  She is following with a neurologist for neurocognitive testing and neuropsychiatric evaluation.  States that she is currently compliant with her psychiatric medications.  Denies any illegal substance abuse.  Admits that she has a lot of life stressors at this time.  Past Medical History:  Diagnosis Date  . ADHD (attention deficit hyperactivity disorder)   . H/O multiple concussions    seen by neuro  . Headache    frequent  . Short-term memory loss    cause from multiple concussions?    Patient Active Problem List   Diagnosis Date Noted  . GAD (generalized anxiety disorder) 07/06/2019  . Cannabis use disorder, mild, abuse 07/06/2019  . Alcohol use disorder, moderate, in early remission (HCC) 07/06/2019  . Headache   . H/O multiple concussions    . Dyslexia, developmental   . Short-term memory loss   . Major depressive disorder, recurrent episode, mild (HCC) 12/21/2018  . Asthma 02/09/2017  . Anxiety and depression 07/30/2016  . PCP NOTES >>>>> 06/02/2015  . Annual physical exam 12/27/2011  .  ADD (attention deficit disorder) 12/27/2011    Past Surgical History:  Procedure Laterality Date  . BUNIONECTOMY    . KNEE ARTHROSCOPY Left 2016     OB History   No obstetric history on file.     Family History  Adopted: Yes  Problem Relation Age of Onset  . Drug abuse Mother     Social History   Tobacco Use  . Smoking status: Never Smoker  . Smokeless tobacco: Never Used  Vaping Use  . Vaping Use: Every day  . Substances: Nicotine  Substance Use Topics  . Alcohol use: Yes    Comment: occasionally  . Drug use: Yes    Types: Marijuana    Home Medications Prior to Admission medications   Medication Sig Start Date End Date Taking? Authorizing Provider  ARIPiprazole (ABILIFY) 5 MG tablet Take 1 tablet (5 mg total) by mouth daily. 09/05/20 10/05/20  Pucilowski, Roosvelt Maser, MD  FLUoxetine (PROZAC) 20 MG capsule Take 3 capsules (60 mg total) by mouth daily. 08/21/20 11/19/20  Pucilowski, Roosvelt Maser, MD  Levonorgestrel (KYLEENA) 19.5 MG IUD Kyleena 17.5 mcg/24 hrs (29yrs) 19.5mg  intrauterine device  Take 1 device by intrauterine route.    [provider]  traZODone (DESYREL) 50 MG tablet Take 1 tablet (50 mg total) by mouth  at bedtime as needed for sleep. 07/29/20 10/27/20  Pucilowski, Roosvelt Maser, MD    Allergies    Patient has no known allergies.  Review of Systems   Review of Systems  Constitutional: Positive for chills and fatigue. Negative for fever.  HENT: Negative for congestion.   Eyes: Negative for visual disturbance.  Respiratory: Positive for shortness of breath. Negative for chest tightness.   Cardiovascular: Negative for chest pain, palpitations and leg swelling.  Gastrointestinal: Positive for nausea and  vomiting. Negative for abdominal pain and diarrhea.  Genitourinary: Negative for dysuria.  Musculoskeletal: Positive for myalgias. Negative for neck pain and neck stiffness.  Skin: Negative for rash.  Neurological: Positive for tremors. Negative for headaches.  Psychiatric/Behavioral: Positive for confusion, decreased concentration and sleep disturbance. Negative for suicidal ideas. The patient is nervous/anxious.     Physical Exam Updated Vital Signs BP 126/86 (BP Location: Right Arm)   Pulse 92   Temp 98.5 F (36.9 C) (Oral)   Resp 20   SpO2 100%   Physical Exam Vitals and nursing note reviewed.  Constitutional:      General: She is not in acute distress.    Appearance: Normal appearance. She is not ill-appearing, toxic-appearing or diaphoretic.  HENT:     Head: Normocephalic.     Mouth/Throat:     Mouth: Mucous membranes are moist.  Eyes:     Extraocular Movements: Extraocular movements intact.  Cardiovascular:     Rate and Rhythm: Normal rate.  Pulmonary:     Effort: Pulmonary effort is normal. No respiratory distress.  Abdominal:     Palpations: Abdomen is soft.     Tenderness: There is no abdominal tenderness.  Musculoskeletal:        General: Swelling present.     Cervical back: No rigidity.  Skin:    General: Skin is warm.  Neurological:     General: No focal deficit present.     Mental Status: She is alert and oriented to person, place, and time. Mental status is at baseline.  Psychiatric:     Comments: Seems appropriate, conversational, cooperative     ED Results / Procedures / Treatments   Labs (all labs ordered are listed, but only abnormal results are displayed) Labs Reviewed  COMPREHENSIVE METABOLIC PANEL - Abnormal; Notable for the following components:      Result Value   CO2 19 (*)    All other components within normal limits  CBC WITH DIFFERENTIAL/PLATELET - Abnormal; Notable for the following components:   WBC 14.7 (*)    Hemoglobin 16.1  (*)    HCT 46.6 (*)    Neutro Abs 11.8 (*)    All other components within normal limits  RAPID URINE DRUG SCREEN, HOSP PERFORMED - Abnormal; Notable for the following components:   Benzodiazepines POSITIVE (*)    Tetrahydrocannabinol POSITIVE (*)    All other components within normal limits  URINALYSIS, ROUTINE W REFLEX MICROSCOPIC - Abnormal; Notable for the following components:   APPearance HAZY (*)    Ketones, ur 20 (*)    All other components within normal limits  LIPASE, BLOOD  I-STAT BETA HCG BLOOD, ED ([facility], [facility], [facility] ONLY)    EKG EKG Interpretation  Date/Time:  Wednesday Oct 22 2020 16:02:14 EDT Ventricular Rate:  117 PR Interval:  130 QRS Duration: 86 QT Interval:  344 QTC Calculation: 479 R Axis:   96 Text Interpretation: Sinus tachycardia Rightward axis Borderline ECG Sinus tachycardia, otherwise unchanged from previous Confirmed by Verle Wheeling,  [place] [id number]) on 10/23/2020 12:53:51 AM   Radiology DG Chest 2 View  Result Date: 10/22/2020 CLINICAL DATA:  Altered level of consciousness, dizziness, short of breath EXAM: CHEST - 2 VIEW COMPARISON:  07/28/2015 FINDINGS: The heart size and mediastinal contours are within normal limits. Both lungs are clear. The visualized skeletal structures are unremarkable. IMPRESSION: No active cardiopulmonary disease. Electronically Signed   By: Sharlet Salina M.D.   On: 10/22/2020 16:46   CT Head Wo Contrast  Result Date: 10/22/2020 CLINICAL DATA:  Confusion, tremors, altered level of consciousness EXAM: CT HEAD WITHOUT CONTRAST TECHNIQUE: Contiguous axial images were obtained from the base of the skull through the vertex without intravenous contrast. COMPARISON:  None. FINDINGS: Brain: No acute infarct or hemorrhage. Lateral ventricles and midline structures are unremarkable. No acute extra-axial fluid collections. No mass effect. Vascular: No hyperdense vessel or unexpected calcification. Skull: Normal. Negative for fracture or focal lesion.  Sinuses/Orbits: No acute finding. Other: None. IMPRESSION: 1. No acute intracranial process. Electronically Signed   By: Sharlet Salina M.D.   On: 10/22/2020 18:05    Procedures Procedures   Medications Ordered in ED Medications - No data to display  ED Course  I have reviewed the triage vital signs and the nursing notes.  Pertinent labs & imaging results that were available during my care of the patient were reviewed by me and considered in my medical decision making (see chart for details).    MDM Rules/Calculators/A&P                          25 year old female presents the emergency department with multiple complaints.  It is hard to tell if these complaints are related or separate in nature.  All of her complaints have a chronic component.  Patient was tachycardic in triage but this self resolved and on my evaluation she is normal sinus rhythm, normal vitals.  She responds to questions appropriately, follows commands.  Does not appear acutely ill.  No active shaking on exam, talks about episodes of confusions but does not display any disorientation or objective findings of confusion on my exam.  She is nonfocal on neuro exam.  She appears well, giggling and pleasant.  Patient did mention an episode of syncope.  EKG is sinus rhythm with normal intervals and no concerning changes when compared to her previous.  Blood work shows a leukocytosis of 14 but otherwise has no acute abnormalities. HCG negative. Urine drug screen is positive for benzodiazepines and THC.  Patient denied any illegal polysubstance abuse.  Head CT shows no abnormal finding, chest x-ray is unremarkable.  In regards to her complaint of not being able to take a deep breath and previous syncope I have lower suspicion for PE, she has no tachycardia on my evaluation.  Denies chest pain or shortness of breath.  EKG does not show any findings of PE.  Do not believe the tachycardia in triage was genuine as she is normal rate on  my exam without any intervention.  Without this tachycardia in triage she is PERC negative.  Patient was able to p.o., when I got her up to ambulate her she was noted to be orthostatic with her systolic blood pressure changing more than 20 mmHg.  I recommended that we place an IV and hydrate the patient.  She is declining.  I have reviewed her medications into her medications are listed to possibly cause orthostatic hypotension.  I discussed this with the  patient and her mom.  I encouraged her to stay on her current regimen but to have these medications reevaluated by her primary doctor.  I tried to convince the patient to stay for IV fluids and said that if on reevaluation she was unsteady on her feet and unable to walk I will plan to admit her.  She continues to decline further care.  I have recommended that the patient stays in the department for the completion of their workup however they decline.  I have expressed the importance of staying including the risks of leaving which include worsening condition, permanent disability and death.  Patient accepts these risks.  They are alert and oriented, they have capacity to make this decision.  I have not been able to convince the patient to stay and they understand the risks of leaving AGAINST MEDICAL ADVICE.   Final Clinical Impression(s) / ED Diagnoses Final diagnoses:  None    Rx / DC Orders ED Discharge Orders    None       Rozelle LoganHorton, Darsha Zumstein M, DO 10/23/20 0146    Rozelle LoganHorton, Tyreese Thain M, DO 10/23/20 [phone]

## 2020-10-23 NOTE — ED Notes (Addendum)
Pt lightheaded when rising from sitting to standing position, pt has visible shaking that can be seen and felt with ambulating. Pt reports dizziness as well. Horton, MD made aware. RN performing orthostatics at this time.

## 2020-10-27 ENCOUNTER — Encounter: Payer: Self-pay | Admitting: Family Medicine

## 2020-10-27 ENCOUNTER — Ambulatory Visit (INDEPENDENT_AMBULATORY_CARE_PROVIDER_SITE_OTHER): Payer: BC Managed Care – PPO | Admitting: Family Medicine

## 2020-10-27 ENCOUNTER — Other Ambulatory Visit: Payer: Self-pay

## 2020-10-27 VITALS — BP 110/70 | HR 72 | Temp 98.6°F | Ht 69.0 in | Wt 170.0 lb

## 2020-10-27 DIAGNOSIS — R42 Dizziness and giddiness: Secondary | ICD-10-CM | POA: Diagnosis not present

## 2020-10-27 DIAGNOSIS — R55 Syncope and collapse: Secondary | ICD-10-CM

## 2020-10-27 NOTE — Patient Instructions (Signed)
Consider taking a salt tab daily (up to 3).  Give [place] 2-3 business days to get the results of your labs back.   If things are normal, plan on a referral to the cardiology team.   Continue to stay hydrated.  Let us know if you need anything.

## 2020-10-27 NOTE — Progress Notes (Signed)
Chief Complaint  Patient presents with  . Hospitalization Follow-up    Confusion and blurred vision     Subjective: Patient is a 25 y.o. female here for low BP and ER f/u.  She is here with her father.  Patient went to the emergency department on 5/4 and 5/5 for tremors, confusion, nausea and lightheadedness.  Her psychiatrist decreased her Abilify and her tremors have improved.  She does not have a follow-up appoint with them at this time.  Confusion is at baseline, nausea and appetite have improved.  She has been drinking more fluids.  She started drinking around 5-6 bottles of Gatorade daily.  She continues to drink lots of water as well.  When she stands up or walks for period of time, she will get lightheadedness.  She has not passed out and denies spinning.  She denies recent illness.  Blood work showed leukocytosis with predominance of neutrophils.  She is not having any fevers.  No diarrhea, current shortness of breath.  She does have a history of seizures and follows with neurology.  They are wondering if lightheadedness could be related to that.  Past Medical History:  Diagnosis Date  . ADHD (attention deficit hyperactivity disorder)   . H/O multiple concussions    seen by neuro  . Headache    frequent  . Short-term memory loss    cause from multiple concussions?    Objective: BP 110/70 (BP Location: Left Arm) Comment: in the lab- after synciple accident  Pulse 72   Temp 98.6 F (37 C) (Oral)   Ht 5\' 9"  (1.753 m)   Wt 170 lb (77.1 kg)   SpO2 97%   BMI 25.10 kg/m  General: Awake, appears stated age HEENT: MMM, EOMi Heart: RRR, no murmurs, no bruits Lungs: CTAB, no rales, wheezes or rhonchi. No accessory muscle use Neuro: DTRs equal and symmetric throughout, no clonus, no cerebellar signs, gait is normal Psych: Age appropriate judgment and insight, normal affect and mood  Assessment and Plan: Lightheadedness - Plan: CBC, Comprehensive metabolic panel, TSH, T4,  free  Orthostatics are negative.  She did have a syncopal episode while in the lab today.  My partner, Dr. , evaluated the patient and thought to be vasovagal syncope.  The patient denies biting her tongue or losing control of bowel/bladder function.  She describes getting poked a couple times for blood work and then losing consciousness.  She was relatively lucid when she woke up.  No reports of convulsions. I would like her to follow-up with her psychiatry team to make sure pharmacotherapy is not playing a role.  This sounds like POTS.  I recommended she continue her high water intake and add salt tabs.  If labs are normal, we will refer to cardiology for their opinion.  This does not seem related to postconcussion syndrome.  Interestingly, I do not think her syncopal episode in the lab correlates with her symptoms of lightheadedness. The patient and her father voiced understanding and agreement to the plan.  Greater than 40 minutes were spent with the patient in addition to reviewing their chart information on the same day of the visit.   Dallas Schimke [place], DO 10/27/20  4:52 PM

## 2020-10-28 ENCOUNTER — Other Ambulatory Visit: Payer: Self-pay | Admitting: Family Medicine

## 2020-10-28 LAB — COMPREHENSIVE METABOLIC PANEL
ALT: 12 U/L (ref 0–35)
AST: 16 U/L (ref 0–37)
Albumin: 4.3 g/dL (ref 3.5–5.2)
Alkaline Phosphatase: 57 U/L (ref 39–117)
BUN: 10 mg/dL (ref 6–23)
CO2: 24 mEq/L (ref 19–32)
Calcium: 9 mg/dL (ref 8.4–10.5)
Chloride: 104 mEq/L (ref 96–112)
Creatinine, Ser: 0.69 mg/dL (ref 0.40–1.20)
GFR: 121.96 mL/min (ref >60.00)
Glucose, Bld: 83 mg/dL (ref 70–99)
Potassium: 3.9 mEq/L (ref 3.5–5.1)
Sodium: 137 mEq/L (ref 135–145)
Total Bilirubin: 0.4 mg/dL (ref 0.2–1.2)
Total Protein: 7.3 g/dL (ref 6.0–8.3)

## 2020-10-28 LAB — CBC
HCT: 43.8 % (ref 36.0–46.0)
Hemoglobin: 14.9 g/dL (ref 12.0–15.0)
MCHC: 34 g/dL (ref 30.0–36.0)
MCV: 96.7 fl (ref 78.0–100.0)
Platelets: 255 10*3/uL (ref 150.0–400.0)
RBC: 4.52 Mil/uL (ref 3.87–5.11)
RDW: 12.6 % (ref 11.5–15.5)
WBC: 8.5 10*3/uL (ref 4.0–10.5)

## 2020-10-28 LAB — TSH: TSH: 1.33 u[IU]/mL (ref 0.35–4.50)

## 2020-10-28 LAB — T4, FREE: Free T4: 0.52 ng/dL — ABNORMAL LOW (ref 0.60–1.60)

## 2020-10-28 MED ORDER — LEVOTHYROXINE SODIUM 25 MCG PO TABS: 25.0000 ug | ORAL_TABLET | Freq: Every day | ORAL | 3 refills | Status: DC

## 2020-11-02 ENCOUNTER — Other Ambulatory Visit: Payer: Self-pay

## 2020-11-02 ENCOUNTER — Emergency Department (HOSPITAL_COMMUNITY)
Admission: EM | Admit: 2020-11-02 | Discharge: 2020-11-02 | Disposition: A | Discharge status: Home / Self Care | Payer: BC Managed Care – PPO | Attending: Emergency Medicine | Admitting: Emergency Medicine

## 2020-11-02 ENCOUNTER — Encounter (HOSPITAL_COMMUNITY): Payer: Self-pay

## 2020-11-02 DIAGNOSIS — J45909 Unspecified asthma, uncomplicated: Secondary | ICD-10-CM | POA: Insufficient documentation

## 2020-11-02 DIAGNOSIS — E161 Other hypoglycemia: Secondary | ICD-10-CM | POA: Diagnosis not present

## 2020-11-02 DIAGNOSIS — Z79899 Other long term (current) drug therapy: Secondary | ICD-10-CM | POA: Diagnosis not present

## 2020-11-02 DIAGNOSIS — R569 Unspecified convulsions: Secondary | ICD-10-CM

## 2020-11-02 DIAGNOSIS — R0902 Hypoxemia: Secondary | ICD-10-CM | POA: Diagnosis not present

## 2020-11-02 DIAGNOSIS — E039 Hypothyroidism, unspecified: Secondary | ICD-10-CM | POA: Diagnosis not present

## 2020-11-02 DIAGNOSIS — E162 Hypoglycemia, unspecified: Secondary | ICD-10-CM | POA: Diagnosis not present

## 2020-11-02 HISTORY — DX: Hypothyroidism, unspecified: E03.9

## 2020-11-02 HISTORY — DX: Unspecified convulsions: R56.9

## 2020-11-02 LAB — CBC WITH DIFFERENTIAL/PLATELET
Abs Immature Granulocytes: 0.02 10*3/uL (ref 0.00–0.07)
Basophils Absolute: 0 10*3/uL (ref 0.0–0.1)
Basophils Relative: 1 %
Eosinophils Absolute: 0.3 10*3/uL (ref 0.0–0.5)
Eosinophils Relative: 3 %
HCT: 44.4 % (ref 36.0–46.0)
Hemoglobin: 14.8 g/dL (ref 12.0–15.0)
Immature Granulocytes: 0 %
Lymphocytes Relative: 27 %
Lymphs Abs: 2.2 10*3/uL (ref 0.7–4.0)
MCH: 32.5 pg (ref 26.0–34.0)
MCHC: 33.3 g/dL (ref 30.0–36.0)
MCV: 97.4 fL (ref 80.0–100.0)
Monocytes Absolute: 0.5 10*3/uL (ref 0.1–1.0)
Monocytes Relative: 7 %
Neutro Abs: 5 10*3/uL (ref 1.7–7.7)
Neutrophils Relative %: 62 %
Platelets: 291 10*3/uL (ref 150–400)
RBC: 4.56 MIL/uL (ref 3.87–5.11)
RDW: 12.2 % (ref 11.5–15.5)
WBC: 8 10*3/uL (ref 4.0–10.5)
nRBC: 0 % (ref 0.0–0.2)

## 2020-11-02 LAB — COMPREHENSIVE METABOLIC PANEL
ALT: 17 U/L (ref 0–44)
AST: 20 U/L (ref 15–41)
Albumin: 4.5 g/dL (ref 3.5–5.0)
Alkaline Phosphatase: 56 U/L (ref 38–126)
Anion gap: 9 (ref 5–15)
BUN: 9 mg/dL (ref 6–20)
CO2: 21 mmol/L — ABNORMAL LOW (ref 22–32)
Calcium: 8.9 mg/dL (ref 8.9–10.3)
Chloride: 107 mmol/L (ref 98–111)
Creatinine, Ser: 0.97 mg/dL (ref 0.44–1.00)
GFR, Estimated: >60 mL/min (ref >60)
Glucose, Bld: 79 mg/dL (ref 70–99)
Potassium: 3.7 mmol/L (ref 3.5–5.1)
Sodium: 137 mmol/L (ref 135–145)
Total Bilirubin: 0.6 mg/dL (ref 0.3–1.2)
Total Protein: 7.6 g/dL (ref 6.5–8.1)

## 2020-11-02 LAB — RAPID URINE DRUG SCREEN, HOSP PERFORMED
Amphetamines: NOT DETECTED
Barbiturates: NOT DETECTED
Benzodiazepines: NOT DETECTED
Cocaine: NOT DETECTED
Opiates: NOT DETECTED
Tetrahydrocannabinol: POSITIVE — AB

## 2020-11-02 LAB — I-STAT BETA HCG BLOOD, ED (MC, WL, AP ONLY): I-stat hCG, quantitative: <5 m[IU]/mL (ref <5)

## 2020-11-02 MED ORDER — SODIUM CHLORIDE 0.9 % IV SOLN
INTRAVENOUS | Status: DC
  Administered 2020-11-02: 10 mL via INTRAVENOUS

## 2020-11-02 NOTE — ED Triage Notes (Signed)
Coming from restaurant, had witnessed seizure like activity, started synthroid 3 days ago, seen [facility], 45 sec in duration

## 2020-11-02 NOTE — ED Provider Notes (Signed)
[facility]-EMERGENCY DEPT Provider Note   CSN: [id number] Arrival date & time: 11/02/20  1318     History Chief Complaint  Patient presents with  . Seizures    Tabitha Hawkins is a 25 y.o. female.  25 year old female presents after having a witnessed seizure by her mother.  Mother states patient was sitting down and stated that she did not feel well.  Patient did experience generalized tonic-clonic activity lasted for proxy 45 seconds.  She was postictal afterwards.  Is currently back to her baseline at this time with exception of some blurred vision.  Patient does currently consume large amounts of water and Gatorade daily.  Scheduled to see a neurologist for cognitive work-up in several weeks.  Denies any prior history of seizures.  Denies any recent illnesses.  She has not had any headaches.  Of note, patient had negative noncontrast CT 1 week ago.  Denies any focal weakness of arms or legs.  Denies any current use of alcohol or illicit drugs        Past Medical History:  Diagnosis Date  . ADHD (attention deficit hyperactivity disorder)   . H/O multiple concussions    seen by neuro  . Headache    frequent  . Hypothyroid   . Short-term memory loss    cause from multiple concussions?    Patient Active Problem List   Diagnosis Date Noted  . GAD (generalized anxiety disorder) 07/06/2019  . Cannabis use disorder, mild, abuse 07/06/2019  . Alcohol use disorder, moderate, in early remission (HCC) 07/06/2019  . Headache   . H/O multiple concussions   . Dyslexia, developmental   . Short-term memory loss   . Major depressive disorder, recurrent episode, mild (HCC) 12/21/2018  . Asthma 02/09/2017  . Anxiety and depression 07/30/2016  . PCP NOTES >>>>> 06/02/2015  . Annual physical exam 12/27/2011  .  ADD (attention deficit disorder) 12/27/2011    Past Surgical History:  Procedure Laterality Date  . BUNIONECTOMY    . KNEE ARTHROSCOPY Left 2016      OB History   No obstetric history on file.     Family History  Adopted: Yes  Problem Relation Age of Onset  . Drug abuse Mother     Social History   Tobacco Use  . Smoking status: Never Smoker  . Smokeless tobacco: Never Used  Vaping Use  . Vaping Use: Every day  . Substances: Nicotine  Substance Use Topics  . Alcohol use: Yes    Comment: occasionally  . Drug use: Yes    Types: Marijuana    Home Medications Prior to Admission medications   Medication Sig Start Date End Date Taking? Authorizing Provider  ARIPiprazole (ABILIFY) 5 MG tablet Take 1 tablet (5 mg total) by mouth daily. 09/05/20 10/05/20  Pucilowski, Roosvelt Maser, MD  FLUoxetine (PROZAC) 20 MG capsule Take 3 capsules (60 mg total) by mouth daily. 08/21/20 11/19/20  Pucilowski, Roosvelt Maser, MD  Levonorgestrel (KYLEENA) 19.5 MG IUD Kyleena 17.5 mcg/24 hrs (58yrs) 19.5mg  intrauterine device  Take 1 device by intrauterine route.    [provider]  levothyroxine (SYNTHROID) 25 MCG tablet Take 1 tablet (25 mcg total) by mouth daily. 10/28/20   Sharlene Dory, DO  traZODone (DESYREL) 50 MG tablet Take 1 tablet (50 mg total) by mouth at bedtime as needed for sleep. 07/29/20 10/27/20  Pucilowski, Roosvelt Maser, MD    Allergies    Patient has no known allergies.  Review of Systems  Review of Systems  All other systems reviewed and are negative.   Physical Exam Updated Vital Signs SpO2 98%   Physical Exam Vitals and nursing note reviewed.  Constitutional:      General: She is not in acute distress.    Appearance: Normal appearance. She is well-developed. She is not toxic-appearing.  HENT:     Head: Normocephalic and atraumatic.  Eyes:     General: Lids are normal.     Conjunctiva/sclera: Conjunctivae normal.     Pupils: Pupils are equal, round, and reactive to light.  Neck:     Thyroid: No thyroid mass.     Trachea: No tracheal deviation.  Cardiovascular:     Rate and Rhythm: Normal rate and  regular rhythm.     Heart sounds: Normal heart sounds. No murmur heard. No gallop.   Pulmonary:     Effort: Pulmonary effort is normal. No respiratory distress.     Breath sounds: Normal breath sounds. No stridor. No decreased breath sounds, wheezing, rhonchi or rales.  Abdominal:     General: Bowel sounds are normal. There is no distension.     Palpations: Abdomen is soft.     Tenderness: There is no abdominal tenderness. There is no rebound.  Musculoskeletal:        General: No tenderness. Normal range of motion.     Cervical back: Normal range of motion and neck supple.  Skin:    General: Skin is warm and dry.     Findings: No abrasion or rash.  Neurological:     General: No focal deficit present.     Mental Status: She is alert and oriented to person, place, and time.     GCS: GCS eye subscore is 4. GCS verbal subscore is 5. GCS motor subscore is 6.     Cranial Nerves: Cranial nerves are intact. No cranial nerve deficit.     Sensory: No sensory deficit.     Motor: Motor function is intact.     Coordination: Coordination is intact.  Psychiatric:        Speech: Speech normal.        Behavior: Behavior normal.     ED Results / Procedures / Treatments   Labs (all labs ordered are listed, but only abnormal results are displayed) Labs Reviewed  CBC WITH DIFFERENTIAL/PLATELET  COMPREHENSIVE METABOLIC PANEL  I-STAT BETA HCG BLOOD, ED ([facility], [facility], [facility] ONLY)    EKG None  Radiology No results found.  Procedures Procedures   Medications Ordered in ED Medications  0.9 %  sodium chloride infusion (has no administration in time range)    ED Course  I have reviewed the triage vital signs and the nursing notes.  Pertinent labs & imaging results that were available during my care of the patient were reviewed by me and considered in my medical decision making (see chart for details).    MDM Rules/Calculators/A&P                          Patient's electrolytes are reassuring  here.  UDS positive for THC but patient does admit to use of cannabis.  Patient seen by neurology and appreciate their input.  Patient to follow-up with her own neurologist.  Return precautions given.  Patient given seizure precautions with regard to driving as well Final Clinical Impression(s) / ED Diagnoses Final diagnoses:  None    Rx / DC Orders ED Discharge Orders    None  Lorre Nick, MD 11/02/20 [phone]

## 2020-11-02 NOTE — ED Notes (Signed)
Patient ambulated to and from bathroom, stable gait with assistance

## 2020-11-02 NOTE — Discharge Instructions (Addendum)
Call your neurologist, Dr. Arbutus Leas, on Monday to schedule a follow-up visit.  Go to [facility] if you have another seizure.  Do not drive a car until cleared by neurology

## 2020-11-03 ENCOUNTER — Telehealth: Payer: Self-pay | Admitting: Neurology

## 2020-11-03 NOTE — Telephone Encounter (Signed)
Patient's mom called to schedule follow up after the patient had a seizure at a restaurant yesterday for the first time. She was transported to the hospital but is home resting now. She'd like a call back.  Appointment with Dr. Arbutus Leas on 11/05/20.

## 2020-11-03 NOTE — Telephone Encounter (Signed)
Spoke with pt mother she stated that 2 weeks ago pt had the dizzy spells with had shaking and passed out she went to [facility] they did CT, EKG and blood work. She has followed up with her PCP they did orthostatics which was inconclusive and blood work found out thyroid was low and started on meds for that last week. She has been home from work past 2 weeks., yesterday 1st time out went to lunch with family started to eat only had a few bites and had a seizure lasted 45 seconds her lips turned blue EMS called pt went to [facility] pt every confused  After  Seizure she did eat dinner at home she is still very tired, Pt mother stated that the hospital told them to call neurology to get a follow up appointment to be seen and for neurology to order the EEG because the hospital could not do it yesterday. Pt mother stated as far as they know pt has never had a seizure. Pt stated the same thing,.

## 2020-11-04 ENCOUNTER — Telehealth (HOSPITAL_COMMUNITY): Payer: BC Managed Care – PPO | Admitting: Psychiatry

## 2020-11-04 NOTE — Progress Notes (Signed)
Assessment/Plan:   1.  Seizure  -concerned about substance abuse. Patient does have history of substance abuse (with xanax).  When she was in the emergency room the week before that event, drug screen was positive for benzodiazepines (not prescribed).  Discussed concept of xanax withdrawal seizure.  Pt is adament that she hasn't had benzo (she even asked what drugs were benzo's although admitted that she abused xanax in past).  Mother left room during the conversation.  Discussed with patient that it is important to be honest in medical eval.  She asked if her OTC meds could cause that and I told her it would not.  -we will do MRI brain   -we will do EEG and if neg, we will do 48 hour ambulatory EEG  -discussed [place] driving laws.  No driving x 6 months from 0/01   2.  Tremor  -abilify may play role but suspect anxiety playing a role  3.  Memory change  -Patient has upcoming appointment for neurocognitive testing.  My suspicion for neurodegenerative process is incredibly low.  I suspect that we will see GAD/depression affecting memory.  Patient has had many concussions in the past, but not so sure that is related to this primarily, especially since they think that this is worsening.  Subjective:   Tabitha Hawkins was seen today in follow up for new onset seizure.  My previous records as well as any outside records available were reviewed prior to todays visit.  Last saw the patient in February, but that was for tremor, likely due to Abilify.  Medical records are reviewed. Pt with mother who supplements hx.   Patient presented to the emergency room on May 5 with "multiple complaints."  Stated that patient had baseline confusion due to multiple concussions, but patient felt that it was worse over the preceding few days.  Patient already had neurocognitive testing scheduled through our office.  Patient denies any illicit drug use, but her urine drug screen was positive for benzodiazepines and THC (no  benzodiazepine currently prescribed to her even through PDMP).  Today, mother states that the reason that they were in the emergency room was that in addition to the confusion she had dizzy spells, syncope with associated shaking (this was not recorded in the emergency room).  Pt states that she thinks she had a "mini seizure" that day - states that she was walking up the stairs and "my whole body starting shaking."  States that she sat down because she was shaking and that was the end.  She states that she "blacked out" after she sat down.    Orthostatics were recorded as positive in the emergency room, but patient declined to stay for treatment per records.  Pt seen by PCP on 5/9 for lightheadedness and orthostatics were neg but apparently had syncopal episode in the lab after getting stuck several times for lab work.  No associated convulsions.  She was found to be hypothyroid and was just started on medication.  Pt returned back to the hospital on May 15 after having a witnessed seizure.  Pt states that she felt fine in the AM and was at lunch with parents and then started not feeling bad - she has trouble describing this feeling.  She said maybe she was lightheaded.  She ordered food for her boyfriend then.  Her mom states that she was sitting up and her head went back and arms and legs were stiff/straight.  Mom states that the entire  body was shaking but all limbs extended.  Mom called 911.  Mom thinks that she was extended limbs for 30 sec to max of 1 min.  Pt then groggy. Parents friend was the EMT and he asked her who he was and she said "it slipped my mind" but she states that she knew him.  Pt walked to the ambulance.  Patient's drug screen was negative for benzos at that point in time, but continued to be positive for THC.    CURRENT MEDICATIONS:  Outpatient Encounter Medications as of 11/05/2020  Medication Sig  . ARIPiprazole (ABILIFY) 5 MG tablet Take 1 tablet (5 mg total) by mouth daily.  Marland Kitchen  FLUoxetine (PROZAC) 20 MG capsule Take 3 capsules (60 mg total) by mouth daily.  . Levonorgestrel (KYLEENA) 19.5 MG IUD Kyleena 17.5 mcg/24 hrs (49yrs) 19.5mg  intrauterine device  Take 1 device by intrauterine route.  Marland Kitchen levothyroxine (SYNTHROID) 25 MCG tablet Take 1 tablet (25 mcg total) by mouth daily.  . traZODone (DESYREL) 50 MG tablet Take 1 tablet (50 mg total) by mouth at bedtime as needed for sleep.   No facility-administered encounter medications on file as of 11/05/2020.     Objective:   PHYSICAL EXAMINATION:    VITALS:   Vitals:   11/05/20 1104  BP: 114/72  Pulse: 88  SpO2: 98%  Weight: 171 lb (77.6 kg)  Height: 5\' 9"  (1.753 m)    GEN:  The patient appears stated age and is in NAD. HEENT:  Normocephalic, atraumatic.  The mucous membranes are moist. The superficial temporal arteries are without ropiness or tenderness. CV:  RRR Lungs:  CTAB Neck/HEME:  There are no carotid bruits bilaterally.  Neurological examination:  Orientation: The patient is alert and oriented x3. Cranial nerves: There is good facial symmetry.The speech is fluent and clear. Soft palate rises symmetrically and there is no tongue deviation. Hearing is intact to conversational tone. Sensation: Sensation is intact to light touch throughout Motor: Strength is at least antigravity x4.  Movement examination: Tone: There is normal tone in the UE/LE Abnormal movements:  no tremor.  No myoclonus.  No asterixis.   Coordination:  There is no decremation with RAM's. Gait and Station: The patient has no difficulty arising out of a deep-seated chair without the use of the hands. The patient's stride length is good.      Total time spent on today's visit was 45 minutes, including both face-to-face time and nonface-to-face time.  Time included that spent on review of records (prior notes available to me/labs/imaging if pertinent), discussing treatment and goals, answering patient's questions and coordinating  care.  Cc:  , MD

## 2020-11-05 ENCOUNTER — Ambulatory Visit: Payer: BC Managed Care – PPO | Admitting: Neurology

## 2020-11-05 ENCOUNTER — Other Ambulatory Visit: Payer: Self-pay

## 2020-11-05 ENCOUNTER — Encounter: Payer: Self-pay | Admitting: Neurology

## 2020-11-05 VITALS — BP 114/72 | HR 88 | Ht 69.0 in | Wt 171.0 lb

## 2020-11-05 DIAGNOSIS — R569 Unspecified convulsions: Secondary | ICD-10-CM | POA: Diagnosis not present

## 2020-11-06 ENCOUNTER — Telehealth (HOSPITAL_COMMUNITY): Payer: BC Managed Care – PPO | Admitting: Psychiatry

## 2020-11-06 ENCOUNTER — Ambulatory Visit: Payer: BC Managed Care – PPO | Admitting: Neurology

## 2020-11-06 DIAGNOSIS — R569 Unspecified convulsions: Secondary | ICD-10-CM | POA: Diagnosis not present

## 2020-11-06 NOTE — Progress Notes (Unsigned)
Patient no show on video call and did not respond to phone call. To be rescheduled.    Maryclare Labrador, MD

## 2020-11-06 NOTE — Procedures (Signed)
TECHNICAL SUMMARY:  A multichannel referential and bipolar montage EEG using the standard international 10-20 system was performed on the patient described as awake and drowsy.  The EEG opens up with a disorganized 5 to 7 Hz theta, with intermixed 8 to 9 Hz activity.  As the EEG proceeds, when she is awake, 8 to 9 Hz activity is noted in the posterior head region.    Low voltage fast (beta) activity is distributed symmetrically and maximally over the anterior head regions.  ACTIVATION:  Stepwise photic stimulation at 4-20 flashes per second was performed and did not elicit any abnormal waveforms.  Hyperventilation was not performed.  EPILEPTIFORM ACTIVITY:  There were no spikes, sharp waves or paroxysmal activity.  SLEEP: Stage I and stage II sleep were noted.    IMPRESSION:  This EEG demonstrated no focal, hemispheric, or lateralizing features.  There was some slowing early on in the recording with a disorganized background.  This could represent physiologic drowsiness with transition to sleep.  Medication effect also cannot be ruled out.  Recommend ambulatory EEG for further clarification.

## 2020-11-10 DIAGNOSIS — J301 Allergic rhinitis due to pollen: Secondary | ICD-10-CM | POA: Diagnosis not present

## 2020-11-10 DIAGNOSIS — J3081 Allergic rhinitis due to animal (cat) (dog) hair and dander: Secondary | ICD-10-CM | POA: Diagnosis not present

## 2020-11-11 DIAGNOSIS — J3089 Other allergic rhinitis: Secondary | ICD-10-CM | POA: Diagnosis not present

## 2020-11-13 ENCOUNTER — Other Ambulatory Visit: Payer: Self-pay

## 2020-11-13 ENCOUNTER — Ambulatory Visit: Payer: BC Managed Care – PPO | Admitting: Psychology

## 2020-11-13 ENCOUNTER — Ambulatory Visit (INDEPENDENT_AMBULATORY_CARE_PROVIDER_SITE_OTHER): Payer: BC Managed Care – PPO | Admitting: Psychology

## 2020-11-13 ENCOUNTER — Encounter: Payer: Self-pay | Admitting: Psychology

## 2020-11-13 DIAGNOSIS — R41840 Attention and concentration deficit: Secondary | ICD-10-CM

## 2020-11-13 DIAGNOSIS — F1021 Alcohol dependence, in remission: Secondary | ICD-10-CM

## 2020-11-13 DIAGNOSIS — F1311 Sedative, hypnotic or anxiolytic abuse, in remission: Secondary | ICD-10-CM

## 2020-11-13 DIAGNOSIS — L508 Other urticaria: Secondary | ICD-10-CM | POA: Insufficient documentation

## 2020-11-13 DIAGNOSIS — F1210 Cannabis abuse, uncomplicated: Secondary | ICD-10-CM | POA: Diagnosis not present

## 2020-11-13 DIAGNOSIS — E039 Hypothyroidism, unspecified: Secondary | ICD-10-CM | POA: Insufficient documentation

## 2020-11-13 DIAGNOSIS — R4189 Other symptoms and signs involving cognitive functions and awareness: Secondary | ICD-10-CM

## 2020-11-13 DIAGNOSIS — F411 Generalized anxiety disorder: Secondary | ICD-10-CM

## 2020-11-13 DIAGNOSIS — F331 Major depressive disorder, recurrent, moderate: Secondary | ICD-10-CM

## 2020-11-13 DIAGNOSIS — L299 Pruritus, unspecified: Secondary | ICD-10-CM | POA: Insufficient documentation

## 2020-11-13 NOTE — Progress Notes (Signed)
   Psychometrician Note   Cognitive testing was administered to [employer] by Wallace Keller, B.S. (psychometrist) under the supervision of Dr. Newman Nickels, Ph.D., licensed psychologist on 11/13/20. Tabitha Hawkins did not appear overtly distressed by the testing session per behavioral observation or responses across self-report questionnaires. Rest breaks were offered.    The battery of tests administered was selected by Dr. Newman Nickels, Ph.D. with consideration to Tabitha Hawkins's current level of functioning, the nature of her symptoms, emotional and behavioral responses during interview, level of literacy, observed level of motivation/effort, and the nature of the referral question. This battery was communicated to the psychometrist. Communication between Dr. Newman Nickels, Ph.D. and the psychometrist was ongoing throughout the evaluation and Dr. Newman Nickels, Ph.D. was immediately accessible at all times. Dr. Newman Nickels, Ph.D. provided supervision to the psychometrist on the date of this service to the extent necessary to assure the quality of all services provided.    Tabitha Hawkins will return within approximately 1-2 weeks for an interactive feedback session with Dr. Milbert Coulter at which time her test performances, clinical impressions, and treatment recommendations will be reviewed in detail. Tabitha Hawkins understands she can contact our office should she require our assistance before this time.  A total of 185 minutes of billable time were spent face-to-face with Tabitha Hawkins by the psychometrist. This includes both test administration and scoring time. Billing for these services is reflected in the clinical report generated by Dr. Newman Nickels, Ph.D.  This note reflects time spent with the psychometrician and does not include test scores or any clinical interpretations made by Dr. Milbert Coulter. The full report will follow in a separate note.

## 2020-11-13 NOTE — Progress Notes (Signed)
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 engagement and persistence. Scores across stand-alone and embedded performance validity measures were within expectation. As such, the results of the current evaluation are believed to be a valid representation of Ms. Haslip's current cognitive functioning.  Test Results: Tabitha Hawkins was generally oriented at the time of the current evaluation. Points were lost for her stating the incorrect date.   Intellectual abilities based upon educational and vocational attainment were estimated to be in the below average to average range. Across IQ testing, her full scale IQ fell in the well below average range (FSIQ: 79, 8th percentile). However, there was a noted discrepancy in verbal comprehension tasks, relative to all other indices, which generally scored in the below average to average normative ranges.    Processing speed was below average to average. Basic attention was average. More complex attention (e.g., working memory) was below average to average. Executive functioning was generally below average to average. She did exhibit some variability across a response inhibition task.  While not directly assessed, receptive language abilities were  believed to be intact as Tabitha Hawkins did not exhibit any difficulties comprehending task instructions and answered all questions asked of her appropriately. Assessed expressive language (e.g., verbal fluency and confrontation naming) was variable. Verbal fluency was exceptionally low, except across a more challenging switching task where performances were below average to average. Confrontation naming was below average to average.     Assessed visuospatial/visuoconstructional abilities were average to above average.    Learning (i.e., encoding) of novel verbal information was well below average across a list learning task but above average across a story task. Spontaneous delayed recall (i.e., retrieval) of previously learned information was below average to above average. Retention rates were 100% across a story learning task, 86% across a list learning task, and 85% across a figure drawing task. Performance across recognition tasks was below average to average, suggesting evidence for information consolidation.  Fine motor coordination and speed was exceptionally low when using her dominant (left) hand and well below average when using her right hand.    Results of emotional screening instruments suggested that recent symptoms of generalized anxiety were in the mild range, while symptoms of depression were within normal limits. However, across a more comprehensive personality assessment, Ms. Aydelotte elevated numerous clinical subscales surrounding somatic complaints, depression, schizophrenia, borderline and antisocial features, drug problems, and suicidal ideation. A screening instrument assessing recent sleep quality suggested the presence of minimal sleep dysfunction.  Tables of Scores:   Note: This summary of test scores accompanies the interpretive report and should not be considered in isolation without reference to the appropriate sections in the text. Descriptors are based on appropriate  normative data and may be adjusted based on clinical judgment. The terms "impaired" and "within normal limits (WNL)" are used when a more specific level of functioning cannot be determined.       Validity Testing:   DESCRIPTOR       Test of Memory Malingering (TOMM): --- --- Within Expectation    Trial 1 --- --- Within Expectation    Trial 2 --- --- Within Expectation    Retention --- --- Within Expectation  ACS Word Choice: --- --- Within Expectation  Dot Counting Test: --- --- Within Expectation  RBANS Effort Index: --- --- Within Expectation  WAIS-IV Reliable Digit Span: --- --- Within Expectation       Orientation:      Raw Score Percentile  NAB Orientation, Form 1 27/29 --- ---       RBANS, Form A: Standard Score/ Scaled Score Percentile   Total Score 85 16 Below Average  Immediate Memory 90 25 Average    List Learning 5 5 Well Below Average    Story Memory 12 75 Above Average  Visuospatial/Constructional 102 55 Average    Figure Copy 12 75 Above Average    Line Orientation 17/20 26-50 Average  Language 78 7 Well Below Average    Picture Naming 9/10 17-25 Below Average to Average    Semantic Fluency 2 <1 Exceptionally Low  Attention 79 8 Well Below Average    Digit Span 8 25 Average    Coding 5 5 Well Below Average  Delayed Memory 99 47 Average    List Recall 6/10 26-50 Average    List Recognition 19/20 10-16 Below Average    Story Recall 13 84 Above Average    Story Recognition 12/12 69+ Average    Figure Recall 11 63 Average    Figure Recognition 8/8 70+ Average       Intellectual Functioning:          Wechsler Adult Intelligence Scale (WAIS-IV): Standard Score/ Scaled Score Percentile   Full Scale IQ  79 8 Well Below Average  GAI 78 7 Well Below Average  Verbal Comprehension Index: 72 3 Well Below Average    Similarities  5 5 Well Below Average    Vocabulary 5 5 Well Below Average    Information  5 5 Well Below Average  Perceptual Reasoning Index:  88 21  Below Average    Block Design  7 16 Below Average    Matrix Reasoning  9 37 Average    Visual Puzzles 8 25 Average  Working Memory Index: 86 18 Below Average    Digit Span 9 37 Average    Arithmetic  6 9 Below Average  Processing Speed Index: 86 18 Below Average    Symbol Search  9 37 Average    Coding 6 9 Below Average       Attention/Executive Function:          Trail Making Test (TMT): Raw Score (T Score) Percentile     Part A 33 secs.,  1 error (40) 16 Below Average    Part B 86 secs.,  0 errors (38) 12 Below Average         Scaled Score Percentile   WAIS-IV Digit Span: 9 37 Average    Forward 9 37 Average    Backward 10 50 Average    Sequencing 7 16 Below Average       D-KEFS Color-Word Interference Test: Raw Score (Scaled Score) Percentile     Color Naming 35 secs. (7) 16 Below Average    Word Reading 21 secs. (11) 63 Average    Inhibition 76 secs. (4) 2 Well Below Average      Total Errors 0 errors (12) 75 Above Average    Inhibition/Switching 56 secs. (10) 50 Average      Total Errors 3 errors (9) 37 Average       D-KEFS Verbal Fluency Test: Raw Score (Scaled Score) Percentile     Letter Total Correct 15 (3) 1 Exceptionally Low    Category Total Correct 22 (3) 1 Exceptionally Low    Category Switching Total Correct 11 (7) 16 Below Average    Category Switching Accuracy 10 (8) 25 Average      Total Set Loss Errors  1 (11) 63 Average      Total Repetition Errors 0 (12) 75 Above Average       Language:          Verbal Fluency Test: Raw Score (T Score) Percentile     Phonemic Fluency (FAS) 15 (23) <1 Exceptionally Low    Animal Fluency 12 (24) <1 Exceptionally Low        Sensory-Motor:          [place] Grooved Pegboard Test: Raw Score Percentile     Dominant Hand 85 secs., 0 drops  1 Exceptionally Low    Non-Dominant Hand 78 secs., 1 drop 7 Well Below Average       Mood and Personality:      Raw Score Percentile   Beck Depression Inventory - II: 5 ---  Within Normal Limits  PROMIS Anxiety Questionnaire: 18 --- Mild       Personality Assessment Inventory: T Score Percentile     Inconsistency 61 --- Moderate    Infrequency 44 --- Within Normal Limits    Negative Impression 70 --- Within Normal Limits    Positive Impression 38 --- Within Normal Limits    Somatic Complaints 77 --- Elevated    Anxiety 62 --- Within Normal Limits    Anxiety-Related Disorders 48 --- Within Normal Limits    Depression 73 --- Elevated    Mania 51 --- Within Normal Limits    Paranoia 51 --- Within Normal Limits    Schizophrenia 74 --- Elevated    Borderline Features 71 --- Elevated    Antisocial Features 77 --- Elevated    Alcohol Problems 65 --- Within Normal Limits    Drug Problems 80 --- Elevated    Aggression 67 --- Within Normal Limits    Suicidal Ideation 86 --- Elevated    Stress 62 --- Within Normal Limits    Non Support 45 --- Within Normal Limits    Treatment Rejection 33 --- Within Normal Limits    Dominance 51 --- Within Normal Limits    Warmth 31 --- Within Normal Limits       Additional Questionnaires:      Raw Score Percentile   PROMIS Sleep Disturbance Questionnaire: 19 --- None to Slight   Informed Consent and Coding/Compliance:   The current evaluation represents a clinical evaluation for the purposes previously outlined by the referral source and is in no way reflective of a forensic evaluation.   Ms. Pavlock was provided with a verbal description of the nature and purpose of the present neuropsychological evaluation. Also reviewed were the foreseeable risks and/or discomforts and benefits of the procedure, limits of confidentiality, and mandatory reporting requirements of this provider. The patient was given the opportunity to ask questions and receive answers about the evaluation. Oral consent to participate was provided by the patient.   This evaluation was conducted by Newman Nickels, Ph.D., licensed clinical neuropsychologist.  Ms. Florence completed a clinical interview with Dr. Milbert Coulter, billed as one unit [phone], and 185 minutes of cognitive testing and scoring, billed as one unit [phone] and five additional units [id number]. Psychometrist Wallace Keller, B.S., assisted Dr. Milbert Coulter with test administration and scoring procedures. As a separate and discrete service, Dr. Milbert Coulter spent a total of 180 minutes in interpretation and report writing billed as one unit [phone] and two units [id number].

## 2020-11-20 ENCOUNTER — Telehealth (HOSPITAL_COMMUNITY): Payer: Self-pay | Admitting: *Deleted

## 2020-11-20 NOTE — Telephone Encounter (Signed)
Former pt of Dr. Lu Duffel requesting a refill of the Prozac and Trazodone. Please review and advise.

## 2020-11-21 MED ORDER — TRAZODONE HCL 50 MG PO TABS: 50.0000 mg | ORAL_TABLET | Freq: Every evening | ORAL | 0 refills | Status: AC | PRN

## 2020-11-21 MED ORDER — FLUOXETINE HCL 20 MG PO CAPS: 60.0000 mg | ORAL_CAPSULE | Freq: Every day | ORAL | 0 refills | Status: DC

## 2020-11-21 NOTE — Telephone Encounter (Signed)
A thirty days bridge supply of Prozac and Trazone was given by Covering MD. Patient must established care with new provider.

## 2020-11-22 ENCOUNTER — Other Ambulatory Visit: Payer: Self-pay

## 2020-11-22 ENCOUNTER — Ambulatory Visit
Admission: RE | Admit: 2020-11-22 | Discharge: 2020-11-22 | Disposition: A | Discharge status: Home / Self Care | Payer: BC Managed Care – PPO | Source: Ambulatory Visit | Attending: Neurology | Admitting: Neurology

## 2020-11-22 DIAGNOSIS — J341 Cyst and mucocele of nose and nasal sinus: Secondary | ICD-10-CM | POA: Diagnosis not present

## 2020-11-22 DIAGNOSIS — R569 Unspecified convulsions: Secondary | ICD-10-CM | POA: Diagnosis not present

## 2020-11-22 DIAGNOSIS — J352 Hypertrophy of adenoids: Secondary | ICD-10-CM | POA: Diagnosis not present

## 2020-11-22 DIAGNOSIS — G930 Cerebral cysts: Secondary | ICD-10-CM | POA: Diagnosis not present

## 2020-11-22 MED ORDER — GADOBENATE DIMEGLUMINE 529 MG/ML IV SOLN
20.0000 mL | Freq: Once | INTRAVENOUS | Status: AC | PRN
  Administered 2020-11-22: 20 mL via INTRAVENOUS

## 2020-11-24 ENCOUNTER — Other Ambulatory Visit: Payer: Self-pay

## 2020-11-24 ENCOUNTER — Telehealth: Payer: Self-pay | Admitting: Neurology

## 2020-11-24 ENCOUNTER — Ambulatory Visit (INDEPENDENT_AMBULATORY_CARE_PROVIDER_SITE_OTHER): Payer: BC Managed Care – PPO | Admitting: Psychology

## 2020-11-24 ENCOUNTER — Ambulatory Visit (INDEPENDENT_AMBULATORY_CARE_PROVIDER_SITE_OTHER): Payer: BC Managed Care – PPO | Admitting: Neurology

## 2020-11-24 DIAGNOSIS — F1311 Sedative, hypnotic or anxiolytic abuse, in remission: Secondary | ICD-10-CM | POA: Diagnosis not present

## 2020-11-24 DIAGNOSIS — F1210 Cannabis abuse, uncomplicated: Secondary | ICD-10-CM | POA: Diagnosis not present

## 2020-11-24 DIAGNOSIS — F411 Generalized anxiety disorder: Secondary | ICD-10-CM | POA: Diagnosis not present

## 2020-11-24 DIAGNOSIS — R569 Unspecified convulsions: Secondary | ICD-10-CM

## 2020-11-24 DIAGNOSIS — R41840 Attention and concentration deficit: Secondary | ICD-10-CM

## 2020-11-24 DIAGNOSIS — F331 Major depressive disorder, recurrent, moderate: Secondary | ICD-10-CM

## 2020-11-24 NOTE — Telephone Encounter (Signed)
Called patient and informed her of results. Patient had no further questions or concerns.

## 2020-11-24 NOTE — Telephone Encounter (Signed)
Let pt know that MRI looks okay.  I see that she saw the results on my chart.  She does have an arachnoid cyst.  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  This is not source of seizure.

## 2020-11-24 NOTE — Progress Notes (Signed)
   Neuropsychology Feedback Session Tabitha Hawkins. [facility] [place] Department of Neurology  Reason for Referral:   Tabitha Hawkins a 25 y.o. left-handed Caucasian female referred by Kerin Salen, D.O.,to characterize hercurrent cognitive functioning and assist with diagnostic clarity and treatment planning in the context of subjective cognitive decline, numerous psychiatric comorbidities, and a history of several concussions.   Feedback:   Tabitha Hawkins completed a comprehensive neuropsychological evaluation on 11/13/2020. Please refer to that encounter for the full report and recommendations. Briefly, cognitive deficits were exhibited across verbal-based intellectual tasks, verbal fluency, and fine motor coordination bilaterally. Performance was variable across response inhibition; however, all other aspects of executive functioning were appropriate. There was also some variability encoding (i.e., learning) novel verbal information; however, delayed recall and recognition memory scores were appropriate. Performance was also appropriate across processing speed, attention/concentration, receptive language, confrontation naming, and visuospatial abilities. 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 It is unlikely that her prior concussive history is playing an active role in her presentation and there is nothing across testing to suggest permanent brain damage relating to a prior head injury. She is also far too young to strongly consider any form of neurodegenerative condition.  Tabitha Hawkins was accompanied by her mother during the current feedback session. Content of the current session focused on the results of her neuropsychological evaluation. Tabitha Hawkins and her mother were given the opportunity to  ask questions and their questions were answered. They were encouraged to reach out should additional questions arise. A copy of her report was provided at the conclusion of the visit.      25 minutes were spent conducting the current feedback session with Tabitha Hawkins, billed as one unit [phone].

## 2020-12-01 DIAGNOSIS — F332 Major depressive disorder, recurrent severe without psychotic features: Secondary | ICD-10-CM | POA: Diagnosis not present

## 2020-12-03 DIAGNOSIS — F339 Major depressive disorder, recurrent, unspecified: Secondary | ICD-10-CM | POA: Diagnosis not present

## 2020-12-03 DIAGNOSIS — F410 Panic disorder [episodic paroxysmal anxiety] without agoraphobia: Secondary | ICD-10-CM | POA: Diagnosis not present

## 2020-12-03 DIAGNOSIS — F902 Attention-deficit hyperactivity disorder, combined type: Secondary | ICD-10-CM | POA: Diagnosis not present

## 2020-12-03 DIAGNOSIS — F4010 Social phobia, unspecified: Secondary | ICD-10-CM | POA: Diagnosis not present

## 2020-12-05 NOTE — Procedures (Signed)
ELECTROENCEPHALOGRAM REPORT  Dates of Recording: 11/24/2020 7:51am to 11/26/2020 7:57AM  Patient's Name: Tabitha Hawkins MRN: [id number] Date of Birth: 01-Nov-1995  Referring Provider: Dr. Lurena Joiner Tat  Procedure: 48-hour ambulatory video EEG  History: This is a 25 year old woman with confusion, dizzy spells, loss of consciousness with shaking. EEG for classification.  Medications:  ABILIFY 5 MG tablet PROZAC 20 MG capsule KYLEENA 19.5 MG IUD SYNTHROID 25 MCG tablet DESYREL 50 MG tablet  Technical Summary: This is a 48-hour multichannel digital video EEG recording measured by the international 10-20 system with electrodes applied with paste and impedances below 5000 ohms performed as portable with EKG monitoring.  The digital EEG was referentially recorded, reformatted, and digitally filtered in a variety of bipolar and referential montages for optimal display.    DESCRIPTION OF RECORDING: During maximal wakefulness, the background activity consisted of a symmetric 10 Hz posterior dominant rhythm which was reactive to eye opening.  There were no epileptiform discharges or focal slowing seen in wakefulness.  During the recording, the patient progresses through wakefulness, drowsiness, and Stage 2 sleep.  Again, there were no epileptiform discharges seen.  Events: On 6/6 1 0900 hours, she reports she forgot a word. She did not push button. No video at this time. Electrographically, there were no EEG or EKG changes seen.   There were no electrographic seizures seen.  EKG lead was unremarkable.  IMPRESSION: This 48-hour ambulatory video EEG study is normal.    CLINICAL CORRELATION: A normal EEG does not exclude a clinical diagnosis of epilepsy.  If further clinical questions remain, inpatient video EEG monitoring may be helpful.   Patrcia Dolly, M.D.

## 2020-12-09 ENCOUNTER — Ambulatory Visit (INDEPENDENT_AMBULATORY_CARE_PROVIDER_SITE_OTHER): Payer: BC Managed Care – PPO | Admitting: Internal Medicine

## 2020-12-09 ENCOUNTER — Encounter: Payer: Self-pay | Admitting: Internal Medicine

## 2020-12-09 ENCOUNTER — Other Ambulatory Visit: Payer: Self-pay

## 2020-12-09 VITALS — BP 116/72 | HR 92 | Temp 98.2°F | Resp 16 | Ht 69.0 in | Wt 183.2 lb

## 2020-12-09 DIAGNOSIS — R946 Abnormal results of thyroid function studies: Secondary | ICD-10-CM

## 2020-12-09 DIAGNOSIS — F32A Depression, unspecified: Secondary | ICD-10-CM | POA: Diagnosis not present

## 2020-12-09 DIAGNOSIS — F419 Anxiety disorder, unspecified: Secondary | ICD-10-CM | POA: Diagnosis not present

## 2020-12-09 DIAGNOSIS — Z1159 Encounter for screening for other viral diseases: Secondary | ICD-10-CM | POA: Diagnosis not present

## 2020-12-09 MED ORDER — OMEPRAZOLE MAGNESIUM 20 MG PO TBEC: 20.0000 mg | DELAYED_RELEASE_TABLET | Freq: Every day | ORAL | 1 refills | Status: AC

## 2020-12-09 NOTE — Progress Notes (Signed)
Subjective:    Patient ID: Tabitha Hawkins, female    DOB: Jun 24, 1995, 24 y.o.   MRN: [id number]  DOS:  12/09/2020 Type of visit - description: Follow-up.  Here with her father.   Last month, TSH was ok but T4 was slightly low, started taking Synthroid but ran out a week ago.  Since the last visit, was seen by neurology, went to the ER, had a seizure, extensive chart review.  At this point, emotionally feels better. She did have another episode of "passing out" last week, for 5 seconds.  She is not driving.  Review of Systems See above   Past Medical History:  Diagnosis Date   Acute urticaria    ADHD (attention deficit hyperactivity disorder), inattentive type 12/27/2011   Alcohol use disorder, in remission 07/06/2019   Asthma 02/09/2017   Benzodiazepine abuse, in remission    Xanax   Cannabis use disorder 07/06/2019   Dyshidrosis (pompholyx) 08/06/2009   Dyslexia, developmental    GAD (generalized anxiety disorder) 07/06/2019   Headache    every other day; manageable   History of multiple concussions    Hypothyroid    Major depressive disorder 07/30/2016   Pruritus, unspecified    Single seizure episode 11/02/2020    Past Surgical History:  Procedure Laterality Date   BUNIONECTOMY     KNEE ARTHROSCOPY Left 2016    Allergies as of 12/09/2020   No Known Allergies      Medication List        Accurate as of December 09, 2020 10:47 AM. If you have any questions, ask your nurse or doctor.          ARIPiprazole 5 MG tablet Commonly known as: ABILIFY Take 1 tablet (5 mg total) by mouth daily.   [employer] SAL[employer] EX Apply topically.   FLUoxetine 20 MG capsule Commonly known as: PROZAC Take 3 capsules (60 mg total) by mouth daily.   Kyleena 19.5 MG IUD Generic drug: levonorgestrel Kyleena 17.5 mcg/24 hrs (40yrs) 19.5mg  intrauterine device  Take 1 device by intrauterine route.   levothyroxine 25 MCG tablet Commonly known as: SYNTHROID Take 1 tablet (25 mcg  total) by mouth daily.   omeprazole 20 MG tablet Commonly known as: PRILOSEC OTC Take 20 mg by mouth daily.   traZODone 50 MG tablet Commonly known as: DESYREL Take 1 tablet (50 mg total) by mouth at bedtime as needed for sleep.           Objective:   Physical Exam BP 116/72 (BP Location: Left Arm, Patient Position: Sitting, Cuff Size: Small)   Pulse 92   Temp 98.2 F (36.8 C) (Oral)   Resp 16   Ht 5\' 9"  (1.753 m)   Wt 183 lb 4 oz (83.1 kg)   BMI 27.06 kg/m  General:   Well developed, NAD, BMI noted. HEENT:  Normocephalic . Face symmetric, atraumatic. Neck: No thyromegaly. Lungs:  CTA B Normal respiratory effort, no intercostal retractions, no accessory muscle use. Heart: RRR,  no murmur.  Lower extremities: no pretibial edema bilaterally  Skin: Not pale. Not jaundice Neurologic:  alert & oriented X3.  Speech normal, gait appropriate for age and unassisted Psych--  Cognition and judgment appear intact.  Cooperative with normal attention span and concentration.  Behavior appropriate. No anxious or depressed appearing.      Assessment     Assessment Asthma PSYCH/NEURO --ADD- Saw a psychologist 2015, DX ADHD, inattentive type. Rx adderall  08-2013 --anxiety Depression: Admitted with  s/i 12-2018 --Postconcussion syndrome: DX 05-2015, saw neuro- d/c college sports  --Neuropsychological evaluation 08/13/2015 referred by neurology, report reviewed, dx  ADD with hyperactivity; + sx of  postconcussion including headaches.    PLAN: Last OV w/ me 08/05/2020, chart is reviewed: - was referred to neurology for tremors, she agreed with psychiatry that sxs were likely related to aripiprazole. -Also was seen at the ER 11/02/2020, was quickly followed by neurology, she had a seizure. They were concerned about unprescribed use of benzodiazepines. They pursue MRI and EEG. Over the last several months, there has been concerns about memory, she has seen the neurology PhD, had a  comprehensive neuropsychological eval. They did find some cognitive deficits but other aspect of the executive function were appropriate.  Please see full report Abnormal TFTs: TSH was normal, T4 slightly low, was Rx Synthroid, to be for 30 days then ran out. Plan: Stop Synthroid, recheck TFTs in 2 months. ADD, depression, anxiety, postconcussion syndrome: see above,  next appointment with psychiatry 12/18/2020, was recommended to pursue ADD treatment, recommend to discuss with psychiatry. On her PHQ-9 today she mentioned occasional suicidal thoughts, overall however she reports she feels better, she actually looks better and is  here with her father.  Reports good med compliance.  Has started seeing a therapist. Seizure disorder: New since last visit, see above, continue working with neurology RTC labs 2 months RTC CPX 6 months    This visit occurred during the SARS-CoV-2 public health emergency.  Safety protocols were in place, including screening questions prior to the visit, additional usage of staff PPE, and extensive cleaning of exam room while observing appropriate contact time as indicated for disinfecting solutions.

## 2020-12-09 NOTE — Patient Instructions (Addendum)
    GO TO THE FRONT DESK, PLEASE SCHEDULE YOUR APPOINTMENTS Come back for  l;abs only in 2 months    Come back for  a physical in 4 to 6 months

## 2020-12-10 NOTE — Assessment & Plan Note (Signed)
Last OV w/ me 08/05/2020, chart is reviewed: - was referred to neurology for tremors, she agreed with psychiatry that sxs were likely related to aripiprazole. -Also was seen at the ER 11/02/2020, was quickly followed by neurology, she had a seizure. They were concerned about unprescribed use of benzodiazepines. They pursue MRI and EEG. Over the last several months, there has been concerns about memory, she has seen the neurology PhD, had a comprehensive neuropsychological eval. They did find some cognitive deficits but other aspect of the executive function were appropriate.  Please see full report Abnormal TFTs: TSH was normal, T4 slightly low, was Rx Synthroid, to be for 30 days then ran out. Plan: Stop Synthroid, recheck TFTs in 2 months. ADD, depression, anxiety, postconcussion syndrome: see above,  next appointment with psychiatry 12/18/2020, was recommended to pursue ADD treatment, recommend to discuss with psychiatry. On her PHQ-9 today she mentioned occasional suicidal thoughts, overall however she reports she feels better, she actually looks better and is  here with her father.  Reports good med compliance.  Has started seeing a therapist. Seizure disorder: New since last visit, see above, continue working with neurology RTC labs 2 months RTC CPX 6 months 

## 2020-12-10 NOTE — Assessment & Plan Note (Signed)
Last OV w/ me 08/05/2020, chart is reviewed: - was referred to neurology for tremors, she agreed with psychiatry that sxs were likely related to aripiprazole. -Also was seen at the ER 11/02/2020, was quickly followed by neurology, she had a seizure. They were concerned about unprescribed use of benzodiazepines. They pursue MRI and EEG. Over the last several months, there has been concerns about memory, she has seen the neurology PhD, had a comprehensive neuropsychological eval. They did find some cognitive deficits but other aspect of the executive function were appropriate.  Please see full report Abnormal TFTs: TSH was normal, T4 slightly low, was Rx Synthroid, to be for 30 days then ran out. Plan: Stop Synthroid, recheck TFTs in 2 months. ADD, depression, anxiety, postconcussion syndrome: see above,  next appointment with psychiatry 12/18/2020, was recommended to pursue ADD treatment, recommend to discuss with psychiatry. On her PHQ-9 today she mentioned occasional suicidal thoughts, overall however she reports she feels better, she actually looks better and is  here with her father.  Reports good med compliance.  Has started seeing a therapist. Seizure disorder: New since last visit, see above, continue working with neurology RTC labs 2 months RTC CPX 6 months

## 2020-12-10 NOTE — Progress Notes (Signed)
Patient advised of EEG  

## 2020-12-12 ENCOUNTER — Ambulatory Visit (INDEPENDENT_AMBULATORY_CARE_PROVIDER_SITE_OTHER): Payer: BC Managed Care – PPO | Admitting: Internal Medicine

## 2020-12-12 ENCOUNTER — Ambulatory Visit (HOSPITAL_BASED_OUTPATIENT_CLINIC_OR_DEPARTMENT_OTHER)
Admission: RE | Admit: 2020-12-12 | Discharge: 2020-12-12 | Disposition: A | Discharge status: Home / Self Care | Payer: BC Managed Care – PPO | Source: Ambulatory Visit | Attending: Internal Medicine | Admitting: Internal Medicine

## 2020-12-12 ENCOUNTER — Encounter: Payer: Self-pay | Admitting: Internal Medicine

## 2020-12-12 ENCOUNTER — Other Ambulatory Visit: Payer: Self-pay

## 2020-12-12 VITALS — BP 126/70 | HR 70 | Temp 98.3°F | Resp 16 | Ht 69.0 in | Wt 183.2 lb

## 2020-12-12 DIAGNOSIS — Z09 Encounter for follow-up examination after completed treatment for conditions other than malignant neoplasm: Secondary | ICD-10-CM

## 2020-12-12 DIAGNOSIS — R519 Headache, unspecified: Secondary | ICD-10-CM

## 2020-12-12 MED ORDER — ONDANSETRON HCL 8 MG PO TABS: 8.0000 mg | ORAL_TABLET | Freq: Three times a day (TID) | ORAL | 0 refills | Status: DC | PRN

## 2020-12-12 MED ORDER — INDOMETHACIN 50 MG PO CAPS: 50.0000 mg | ORAL_CAPSULE | Freq: Three times a day (TID) | ORAL | 0 refills | Status: DC | PRN

## 2020-12-12 NOTE — Progress Notes (Signed)
Subjective:    Patient ID: Tabitha Hawkins, female    DOB: [birth year], 25 y.o.   MRN: [id number]  DOS:  12/12/2020 Type of visit - description: acute here with the brother of her boyfriend.  She was doing fine until 12/09/2020, in a matter of 10 minutes developed a severe headache, mostly behind the eyes, as intense as 10/10. This is a new issue, previous headaches were associated with head injuries.  Denies any recent fall. Pain is associated with some noise and light sensitivity. + Nausea no vomiting. When asked admits to some neck pain when she move her head to the sides but no neck stiffness. Intensity varies from 6/10-10/10. Has taken Excedrin OTC with minimal relief.   Review of Systems Denies fever chills No sinus pain Minimal dizziness. No diplopia or slurred speech. No seizure activity  Past Medical History:  Diagnosis Date   Acute urticaria    ADHD (attention deficit hyperactivity disorder), inattentive type 12/27/2011   Alcohol use disorder, in remission 07/06/2019   Asthma 02/09/2017   Benzodiazepine abuse, in remission    Xanax   Cannabis use disorder 07/06/2019   Dyshidrosis (pompholyx) 08/06/2009   Dyslexia, developmental    GAD (generalized anxiety disorder) 07/06/2019   Headache    every other day; manageable   History of multiple concussions    Hypothyroid    Major depressive disorder 07/30/2016   Pruritus, unspecified    Single seizure episode 11/02/2020    Past Surgical History:  Procedure Laterality Date   BUNIONECTOMY     KNEE ARTHROSCOPY Left 2016    Allergies as of 12/12/2020   No Known Allergies      Medication List        Accurate as of December 12, 2020 11:59 PM. If you have any questions, ask your nurse or doctor.          ARIPiprazole 5 MG tablet Commonly known as: ABILIFY Take 1 tablet (5 mg total) by mouth daily.   [employer] MIN[employer]AL SALTS EX Apply topically.   FLUoxetine 20 MG capsule Commonly known as: PROZAC Take 3 capsules  (60 mg total) by mouth daily.   indomethacin 50 MG capsule Commonly known as: INDOCIN Take 1 capsule (50 mg total) by mouth 3 (three) times daily as needed. Started by: Willow Ora, MD   Kyleena 19.5 MG IUD Generic drug: levonorgestrel Kyleena 17.5 mcg/24 hrs (74yrs) 19.5mg  intrauterine device  Take 1 device by intrauterine route.   omeprazole 20 MG tablet Commonly known as: PRILOSEC OTC Take 1 tablet (20 mg total) by mouth daily.   ondansetron 8 MG tablet Commonly known as: Zofran Take 1 tablet (8 mg total) by mouth every 8 (eight) hours as needed for nausea or vomiting. Started by: Willow Ora, MD   traZODone 50 MG tablet Commonly known as: DESYREL Take 1 tablet (50 mg total) by mouth at bedtime as needed for sleep.           Objective:   Physical Exam BP 126/70 (BP Location: Left Arm, Patient Position: Sitting, Cuff Size: Small)   Pulse 70   Temp 98.3 F (36.8 C) (Oral)   Resp 16   Ht 5\' 9"  (1.753 m)   Wt 183 lb 4 oz (83.1 kg)   SpO2 99%   BMI 27.06 kg/m  General:   Well developed, NAD, BMI noted. HEENT:  Normocephalic . Face symmetric, atraumatic Neck: No TTP of the cervical spine.  Supple.  Range of motion: Normal, some pain elicited  when she turns left or right. Lungs:  CTA B Normal respiratory effort, no intercostal retractions, no accessory muscle use. Heart: RRR,  no murmur.  Lower extremities: no pretibial edema bilaterally  Skin: Not pale. Not jaundice Neurologic:  alert & oriented X3.  Speech normal, gait appropriate for age and unassisted. EOMI, pupils equal and reactive.  DTR symmetric. [place] absent Psych--  Cognition and judgment appear intact.  Cooperative with normal attention span and concentration.  Behavior appropriate. No anxious or depressed appearing.      Assessment     Assessment Asthma PSYCH/NEURO --ADD- Saw a psychologist 2015, DX ADHD, inattentive type. Rx adderall  08-2013 --anxiety Depression: Admitted with s/i  12-2018 --Postconcussion syndrome: DX 05-2015, saw neuro- d/c college sports  --Neuropsychological evaluation 08/13/2015 referred by neurology, report reviewed, dx  ADD with hyperactivity; + sx of  postconcussion including headaches.    PLAN: Headache: New onset of headache, previous cephalalgias related to head concussions.  Neurological exam is symmetric, no fever chills.  Given the association with nausea, light and sound intolerance suspect migraine. She recently had a CT and brain MRI that were normal but at this point repeated CT is indicated.  Rule out bleeding. If negative and symptoms persist or increase patient is instructed to go to the ER. Birth control, IUD Plan: CT, rest, fluids, Zofran, Indocin, stop OTCs.  Call if not gradually better.  See AVS.  This visit occurred during the SARS-CoV-2 public health emergency.  Safety protocols were in place, including screening questions prior to the visit, additional usage of staff PPE, and extensive cleaning of exam room while observing appropriate contact time as indicated for disinfecting solutions.

## 2020-12-12 NOTE — Patient Instructions (Signed)
Go to the first floor and get a CAT scan of the head.  I think you have a migraine headache  The treatment is:  Rest Drink plenty fluids Zofran as needed for nausea Ibuprofen every 8 hours as needed for headache.  If you get worse, even if your  CAT scan is reported normal, go to the ER.  If you are not gradually better let me know.

## 2020-12-13 NOTE — Assessment & Plan Note (Signed)
Headache: New onset of headache, previous cephalalgias related to head concussions.  Neurological exam is symmetric, no fever chills.  Given the association with nausea, light and sound intolerance suspect migraine. She recently had a CT and brain MRI that were normal but at this point repeated CT is indicated.  Rule out bleeding. If negative and symptoms persist or increase patient is instructed to go to the ER. Birth control, IUD Plan: CT, rest, fluids, Zofran, Indocin, stop OTCs.  Call if not gradually better.  See AVS.

## 2020-12-16 ENCOUNTER — Telehealth: Payer: Self-pay

## 2020-12-16 NOTE — Telephone Encounter (Signed)
LMOM informing Pt to let us know if headaches aren't better or are worse.

## 2020-12-24 ENCOUNTER — Telehealth (HOSPITAL_COMMUNITY): Payer: Self-pay | Admitting: *Deleted

## 2020-12-24 NOTE — Telephone Encounter (Signed)
Former pt of Dr. Hinton Dyer called requesting refill of Abilify 5 mg last written on 09/05/20. Pt has received certified letter however pt no showed twice for Dr. Daleen Bo on 11/04/20 and 11/06/20. She last saw Dr. Demetrius Charity on 08/21/20 after cx appointments for 09/05/20 and then on 09/10/20. Please review and advise.

## 2020-12-24 NOTE — Telephone Encounter (Signed)
She need to establish with new provider.

## 2020-12-26 DIAGNOSIS — F332 Major depressive disorder, recurrent severe without psychotic features: Secondary | ICD-10-CM | POA: Diagnosis not present

## 2020-12-30 ENCOUNTER — Telehealth: Payer: Self-pay

## 2020-12-30 ENCOUNTER — Telehealth (HOSPITAL_COMMUNITY): Payer: Self-pay

## 2020-12-30 NOTE — Telephone Encounter (Signed)
Please advise 

## 2020-12-30 NOTE — Telephone Encounter (Signed)
Patient called stating her psychiatrist she was seeing retired and and that practice was still filling her abilify script for a while. They have no stopped filling it and dropped her as a pt without warning and she had to find another psychiatrist.  Pt will be seeing her new psychiatrist on 01/29/21, but she is needing a script for abilify now.  Pt is going out of town on Thursday.  The prior psychiatrist's office suggested she contact her PCP's office for a refill in the interim.  [facility] is [facility].

## 2020-12-30 NOTE — Telephone Encounter (Signed)
Patient called requesting refill on her medication due to her going out of town. Writer spoke with pt and stated that her 30 day bridge supply on her medications was sent in and a certified letter sent out to her to find new provider. I suggested she call her PCP to see if they can provide her medication until she finds a new provider. She then stated that she has an appointment with a new provider so I again suggested she call her PCP to see if they can do a bridge supply until her appointment with the new provider

## 2020-12-31 MED ORDER — ARIPIPRAZOLE 5 MG PO TABS: 5.0000 mg | ORAL_TABLET | Freq: Every day | ORAL | 1 refills | Status: DC

## 2020-12-31 NOTE — Telephone Encounter (Signed)
Patient is checking the status of medication 

## 2020-12-31 NOTE — Telephone Encounter (Signed)
Rx sent 

## 2020-12-31 NOTE — Telephone Encounter (Signed)
Okay to prescribe 1 month supply and 1 refill

## 2021-01-12 DIAGNOSIS — J069 Acute upper respiratory infection, unspecified: Secondary | ICD-10-CM | POA: Diagnosis not present

## 2021-01-16 ENCOUNTER — Encounter: Payer: Self-pay | Admitting: Internal Medicine

## 2021-01-16 ENCOUNTER — Ambulatory Visit (INDEPENDENT_AMBULATORY_CARE_PROVIDER_SITE_OTHER): Payer: BC Managed Care – PPO | Admitting: Internal Medicine

## 2021-01-16 ENCOUNTER — Other Ambulatory Visit: Payer: Self-pay

## 2021-01-16 VITALS — BP 116/78 | HR 69 | Temp 97.9°F | Resp 18 | Ht 69.0 in | Wt 184.4 lb

## 2021-01-16 DIAGNOSIS — J4531 Mild persistent asthma with (acute) exacerbation: Secondary | ICD-10-CM

## 2021-01-16 DIAGNOSIS — J40 Bronchitis, not specified as acute or chronic: Secondary | ICD-10-CM

## 2021-01-16 MED ORDER — PREDNISONE 10 MG PO TABS: ORAL_TABLET | ORAL | 0 refills | Status: DC

## 2021-01-16 MED ORDER — AZITHROMYCIN 250 MG PO TABS: ORAL_TABLET | ORAL | 0 refills | Status: DC

## 2021-01-16 NOTE — Progress Notes (Signed)
Subjective:    Patient ID: Tabitha Hawkins, female    DOB: 07/11/1995, 25 y.o.   MRN: [id number]  DOS:  01/16/2021 Type of visit - description: Acute  Symptoms a started 01/10/2021. Nose congestion, wheezing, cough. 2 days later she tested negative for COVID. At this point she is feeling about the same.  No fever chills. Mild sore throat No nausea, vomiting or diarrhea. She is coughing up clear sputum production, some wheezing. No myalgias No headaches  Review of Systems See above   Past Medical History:  Diagnosis Date   Acute urticaria    ADHD (attention deficit hyperactivity disorder), inattentive type 12/27/2011   Alcohol use disorder, in remission 07/06/2019   Asthma 02/09/2017   Benzodiazepine abuse, in remission    Xanax   Cannabis use disorder 07/06/2019   Dyshidrosis (pompholyx) 08/06/2009   Dyslexia, developmental    GAD (generalized anxiety disorder) 07/06/2019   Headache    every other day; manageable   History of multiple concussions    Hypothyroid    Major depressive disorder 07/30/2016   Pruritus, unspecified    Single seizure episode 11/02/2020    Past Surgical History:  Procedure Laterality Date   BUNIONECTOMY     KNEE ARTHROSCOPY Left 2016    Allergies as of 01/16/2021   No Known Allergies      Medication List        Accurate as of January 16, 2021  1:00 PM. If you have any questions, ask your nurse or doctor.          ARIPiprazole 5 MG tablet Commonly known as: ABILIFY Take 1 tablet (5 mg total) by mouth daily.   [employer] SAL[employer] EX Apply topically.   FLUoxetine 20 MG capsule Commonly known as: PROZAC Take 3 capsules (60 mg total) by mouth daily.   indomethacin 50 MG capsule Commonly known as: INDOCIN Take 1 capsule (50 mg total) by mouth 3 (three) times daily as needed.   Kyleena 19.5 MG IUD Generic drug: levonorgestrel Kyleena 17.5 mcg/24 hrs (77yrs) 19.5mg  intrauterine device  Take 1 device by intrauterine route.    omeprazole 20 MG tablet Commonly known as: PRILOSEC OTC Take 1 tablet (20 mg total) by mouth daily.   ondansetron 8 MG tablet Commonly known as: Zofran Take 1 tablet (8 mg total) by mouth every 8 (eight) hours as needed for nausea or vomiting.   traZODone 50 MG tablet Commonly known as: DESYREL Take 1 tablet (50 mg total) by mouth at bedtime as needed for sleep.           Objective:   Physical Exam BP 116/78 (BP Location: Left Arm, Patient Position: Sitting, Cuff Size: Small)   Pulse 69   Temp 97.9 F (36.6 C) (Oral)   Resp 18   Ht 5\' 9"  (1.753 m)   Wt 184 lb 6 oz (83.6 kg)   SpO2 98%   BMI 27.23 kg/m  General:   Well developed, NAD, BMI noted. HEENT:  Normocephalic . Face symmetric, atraumatic.  TMs slightly bulged, not red.  Throat symmetric, not red. Lungs:  Slightly increased expiratory time, no wheezing.  Some rhonchi with cough only Normal respiratory effort, no intercostal retractions, no accessory muscle use. Heart: RRR,  no murmur.  Lower extremities: no pretibial edema bilaterally  Skin: Not pale. Not jaundice Neurologic:  alert & oriented X3.  Speech normal, gait appropriate for age and unassisted Psych--  Cognition and judgment appear intact.  Cooperative with normal attention span  and concentration.  Behavior appropriate. No anxious or depressed appearing.      Assessment     Assessment Asthma PSYCH/NEURO --ADD- Saw a psychologist 2015, DX ADHD, inattentive type. Rx adderall  08-2013 --anxiety Depression: Admitted with s/i 12-2018 --Postconcussion syndrome: DX 05-2015, saw neuro- d/c college sports  --Neuropsychological evaluation 08/13/2015 referred by neurology, report reviewed, dx  ADD with hyperactivity; + sx of  postconcussion including headaches.    PLAN: Bronchitis, asthma exacerbation: Symptoms as described above, suspect bronchitis and mild asthma exacerbation.  Vital signs are stable. Plan: Prednisone, Zithromax, Mucinex, continue  albuterol.  Definitely call if not gradually better. Also recommend to recheck for COVID, if she is +, she will not qualify for treatment based on the day of onset however she will have to be isolated for 10 days. Headaches: See last visit, resolved. Anxiety, depression: Stable, to see her new psychiatrist 01/29/2021    This visit occurred during the SARS-CoV-2 public health emergency.  Safety protocols were in place, including screening questions prior to the visit, additional usage of staff PPE, and extensive cleaning of exam room while observing appropriate contact time as indicated for disinfecting solutions.

## 2021-01-16 NOTE — Patient Instructions (Signed)
For bronchitis: Mucinex DM over-the-counter as needed Prednisone for 5 days Zithromax for 5 days Continue using your inhaler If you are not gradually better let us know.  Also recommend to check another time for COVID.  If it is positive let me know.  You will not qualify for treatment but you should use a mask and try to avoid staying in the public for 5 additional days

## 2021-01-17 NOTE — Assessment & Plan Note (Signed)
Bronchitis, asthma exacerbation: Symptoms as described above, suspect bronchitis and mild asthma exacerbation.  Vital signs are stable. Plan: Prednisone, Zithromax, Mucinex, continue albuterol.  Definitely call if not gradually better. Also recommend to recheck for COVID, if she is +, she will not qualify for treatment based on the day of onset however she will have to be isolated for 10 days. Headaches: See last visit, resolved. Anxiety, depression: Stable, to see her new psychiatrist 01/29/2021

## 2021-01-20 DIAGNOSIS — F411 Generalized anxiety disorder: Secondary | ICD-10-CM | POA: Diagnosis not present

## 2021-02-03 DIAGNOSIS — F411 Generalized anxiety disorder: Secondary | ICD-10-CM | POA: Diagnosis not present

## 2021-02-03 DIAGNOSIS — F902 Attention-deficit hyperactivity disorder, combined type: Secondary | ICD-10-CM | POA: Diagnosis not present

## 2021-02-03 DIAGNOSIS — F4010 Social phobia, unspecified: Secondary | ICD-10-CM | POA: Diagnosis not present

## 2021-02-03 DIAGNOSIS — F410 Panic disorder [episodic paroxysmal anxiety] without agoraphobia: Secondary | ICD-10-CM | POA: Diagnosis not present

## 2021-02-03 DIAGNOSIS — F339 Major depressive disorder, recurrent, unspecified: Secondary | ICD-10-CM | POA: Diagnosis not present

## 2021-02-11 ENCOUNTER — Other Ambulatory Visit: Payer: Self-pay

## 2021-02-11 ENCOUNTER — Other Ambulatory Visit (INDEPENDENT_AMBULATORY_CARE_PROVIDER_SITE_OTHER): Payer: BC Managed Care – PPO

## 2021-02-11 DIAGNOSIS — Z1159 Encounter for screening for other viral diseases: Secondary | ICD-10-CM | POA: Diagnosis not present

## 2021-02-11 DIAGNOSIS — R946 Abnormal results of thyroid function studies: Secondary | ICD-10-CM | POA: Diagnosis not present

## 2021-02-11 LAB — T4, FREE: Free T4: 0.77 ng/dL (ref 0.60–1.60)

## 2021-02-11 LAB — TSH: TSH: 0.39 u[IU]/mL (ref 0.35–5.50)

## 2021-02-11 LAB — T3, FREE: T3, Free: 3 pg/mL (ref 2.3–4.2)

## 2021-02-12 LAB — HEPATITIS C ANTIBODY
Hepatitis C Ab: NONREACTIVE
SIGNAL TO CUT-OFF: 0.02 (ref <1.00)

## 2021-02-19 DIAGNOSIS — F411 Generalized anxiety disorder: Secondary | ICD-10-CM | POA: Diagnosis not present

## 2021-02-24 ENCOUNTER — Telehealth: Payer: Self-pay | Admitting: Internal Medicine

## 2021-02-24 NOTE — Telephone Encounter (Signed)
Please advise 

## 2021-02-24 NOTE — Telephone Encounter (Signed)
Pt. Is requesting a note stating that she deals with issues with reoccurring seizures. She attempted to reach out to her neurologist but she hasn't been able to get in contact with them. She is worried about loosing her position and her employer requested documentation stating she has an issue.

## 2021-02-25 ENCOUNTER — Telehealth: Payer: Self-pay | Admitting: Neurology

## 2021-02-25 NOTE — Telephone Encounter (Signed)
Chart reviewed, I cannot make the statement that she has recurrent seizures.

## 2021-02-25 NOTE — Telephone Encounter (Signed)
Called patient back and expressed to her that she needed to reach out to the provider that had prescribed her the medication Bupropion and let them know how she was feeling. I let he know that the EEGs have been normal through our office and only known / presumed source of seizure was xanax withdrawal. I also let he know that she should go to the ER should she feel that she may have a seizure or doesn't feel right

## 2021-02-25 NOTE — Telephone Encounter (Signed)
Spoke w/ Pt- informed that PCP stated he can't make that statement- informed I'd send this message to neurology to see if they can help. Pt verbalized understanding.

## 2021-02-25 NOTE — Telephone Encounter (Signed)
Patient was sent home from work yesterday due to shaking and she is still not feeling right today.  She is requesting a note to excuse her from work as she just started a new job and doesn't want to jeopardize that.  Patient is afraid she is going to have another seizure so her mom is coming to pick her up so she is not alone.  Patient stated her psychiatrist took her off her Abilify recently and she'd left a note with that office. Patient not sure if this change is a factor or not.

## 2021-02-25 NOTE — Telephone Encounter (Signed)
Called patient and let her know Dr. Arbutus Leas would not be able to give her an excuse for work today. Patient was not happy with this response. I did let her know to reach out to her PCP or her psychiatrist for a work excuse

## 2021-02-25 NOTE — Telephone Encounter (Signed)
Per previous call, pt called and asked for a work note because felt shaky and declined.  She called the access nurse back with different information than given to [place].  Stated that she was started on wellbutrin and felt like going to have seizure.  Please call patient and tell her that she should call the provider that put her on this medication.  I haven't seen her since February but in all of our testing/EEG's, it was normal (good news) and only known/presumed source of seizure was xanax withdrawal.  Wellbutrin does lower seizure threshold and she should let prescribing provider know how she feels.  She should also go to the ER should she feel that she may have a seizure or doesn't feel right.

## 2021-02-25 NOTE — Telephone Encounter (Signed)
Patient called back to check on the status of the note, states she needs it today. She was advised that the telephone note was in and to wait until she got a call back about it.

## 2021-02-25 NOTE — Telephone Encounter (Signed)
Caller transferred to office by Access Nurse.  Explained to patient's mom with patient on the line that Dr. Arbutus Leas recommends, per last phone note, patient to follow up with her psychiatrist about this problem.  Understanding expressed.

## 2021-03-05 DIAGNOSIS — F902 Attention-deficit hyperactivity disorder, combined type: Secondary | ICD-10-CM | POA: Diagnosis not present

## 2021-03-05 DIAGNOSIS — F4010 Social phobia, unspecified: Secondary | ICD-10-CM | POA: Diagnosis not present

## 2021-03-05 DIAGNOSIS — F339 Major depressive disorder, recurrent, unspecified: Secondary | ICD-10-CM | POA: Diagnosis not present

## 2021-03-05 DIAGNOSIS — F410 Panic disorder [episodic paroxysmal anxiety] without agoraphobia: Secondary | ICD-10-CM | POA: Diagnosis not present

## 2021-03-16 DIAGNOSIS — F332 Major depressive disorder, recurrent severe without psychotic features: Secondary | ICD-10-CM | POA: Diagnosis not present

## 2021-03-23 DIAGNOSIS — J3089 Other allergic rhinitis: Secondary | ICD-10-CM | POA: Diagnosis not present

## 2021-03-23 DIAGNOSIS — J301 Allergic rhinitis due to pollen: Secondary | ICD-10-CM | POA: Diagnosis not present

## 2021-03-23 DIAGNOSIS — J3081 Allergic rhinitis due to animal (cat) (dog) hair and dander: Secondary | ICD-10-CM | POA: Diagnosis not present

## 2021-03-25 DIAGNOSIS — J3089 Other allergic rhinitis: Secondary | ICD-10-CM | POA: Diagnosis not present

## 2021-03-25 DIAGNOSIS — J3081 Allergic rhinitis due to animal (cat) (dog) hair and dander: Secondary | ICD-10-CM | POA: Diagnosis not present

## 2021-03-25 DIAGNOSIS — J301 Allergic rhinitis due to pollen: Secondary | ICD-10-CM | POA: Diagnosis not present

## 2021-03-30 DIAGNOSIS — J3089 Other allergic rhinitis: Secondary | ICD-10-CM | POA: Diagnosis not present

## 2021-03-30 DIAGNOSIS — J3081 Allergic rhinitis due to animal (cat) (dog) hair and dander: Secondary | ICD-10-CM | POA: Diagnosis not present

## 2021-03-30 DIAGNOSIS — J301 Allergic rhinitis due to pollen: Secondary | ICD-10-CM | POA: Diagnosis not present

## 2021-04-01 DIAGNOSIS — Z23 Encounter for immunization: Secondary | ICD-10-CM | POA: Diagnosis not present

## 2021-04-01 DIAGNOSIS — J301 Allergic rhinitis due to pollen: Secondary | ICD-10-CM | POA: Diagnosis not present

## 2021-04-01 DIAGNOSIS — J3089 Other allergic rhinitis: Secondary | ICD-10-CM | POA: Diagnosis not present

## 2021-04-01 DIAGNOSIS — J3081 Allergic rhinitis due to animal (cat) (dog) hair and dander: Secondary | ICD-10-CM | POA: Diagnosis not present

## 2021-04-02 DIAGNOSIS — F339 Major depressive disorder, recurrent, unspecified: Secondary | ICD-10-CM | POA: Diagnosis not present

## 2021-04-02 DIAGNOSIS — F4010 Social phobia, unspecified: Secondary | ICD-10-CM | POA: Diagnosis not present

## 2021-04-02 DIAGNOSIS — F410 Panic disorder [episodic paroxysmal anxiety] without agoraphobia: Secondary | ICD-10-CM | POA: Diagnosis not present

## 2021-04-02 DIAGNOSIS — F902 Attention-deficit hyperactivity disorder, combined type: Secondary | ICD-10-CM | POA: Diagnosis not present

## 2021-04-03 DIAGNOSIS — J301 Allergic rhinitis due to pollen: Secondary | ICD-10-CM | POA: Diagnosis not present

## 2021-04-03 DIAGNOSIS — J3081 Allergic rhinitis due to animal (cat) (dog) hair and dander: Secondary | ICD-10-CM | POA: Diagnosis not present

## 2021-04-03 DIAGNOSIS — J3089 Other allergic rhinitis: Secondary | ICD-10-CM | POA: Diagnosis not present

## 2021-04-06 DIAGNOSIS — J3089 Other allergic rhinitis: Secondary | ICD-10-CM | POA: Diagnosis not present

## 2021-04-06 DIAGNOSIS — J301 Allergic rhinitis due to pollen: Secondary | ICD-10-CM | POA: Diagnosis not present

## 2021-04-06 DIAGNOSIS — J3081 Allergic rhinitis due to animal (cat) (dog) hair and dander: Secondary | ICD-10-CM | POA: Diagnosis not present

## 2021-04-08 DIAGNOSIS — J3081 Allergic rhinitis due to animal (cat) (dog) hair and dander: Secondary | ICD-10-CM | POA: Diagnosis not present

## 2021-04-08 DIAGNOSIS — J3089 Other allergic rhinitis: Secondary | ICD-10-CM | POA: Diagnosis not present

## 2021-04-08 DIAGNOSIS — J301 Allergic rhinitis due to pollen: Secondary | ICD-10-CM | POA: Diagnosis not present

## 2021-04-10 ENCOUNTER — Other Ambulatory Visit: Payer: Self-pay

## 2021-04-10 ENCOUNTER — Ambulatory Visit
Admission: EM | Admit: 2021-04-10 | Discharge: 2021-04-10 | Disposition: A | Discharge status: Home / Self Care | Payer: BC Managed Care – PPO

## 2021-04-10 DIAGNOSIS — L509 Urticaria, unspecified: Secondary | ICD-10-CM | POA: Diagnosis not present

## 2021-04-10 MED ORDER — METHYLPREDNISOLONE 4 MG PO TBPK: ORAL_TABLET | ORAL | 0 refills | Status: DC

## 2021-04-10 MED ORDER — MONTELUKAST SODIUM 10 MG PO TABS: 10.0000 mg | ORAL_TABLET | Freq: Every day | ORAL | 0 refills | Status: DC

## 2021-04-10 MED ORDER — METHYLPREDNISOLONE SODIUM SUCC 125 MG IJ SOLR: 125.0000 mg | Freq: Once | INTRAMUSCULAR | Status: DC

## 2021-04-10 NOTE — ED Provider Notes (Signed)
[facility]    CSN: [id number] Arrival date & time: 04/10/21  1503      History   Chief Complaint Chief Complaint  Patient presents with   Rash    HPI Tabitha Hawkins is a 25 y.o. female.   Patient reports a history of allergies to "everything".  Patient states she is currently going to see a allergist for immunotherapy.  Patient states she is getting injections 3 times a week.  He states he woke up this morning with diffuse itching and swelling of her scalp, states she has had this before and usually has treatment with steroids.  Patient states she called her allergist about this but they were unable to work her in today.  States they told her to come to urgent care for treatment.  The history is provided by the patient.   Past Medical History:  Diagnosis Date   Acute urticaria    ADHD (attention deficit hyperactivity disorder), inattentive type 12/27/2011   Alcohol use disorder, in remission 07/06/2019   Asthma 02/09/2017   Benzodiazepine abuse, in remission    Xanax   Cannabis use disorder 07/06/2019   Dyshidrosis (pompholyx) 08/06/2009   Dyslexia, developmental    GAD (generalized anxiety disorder) 07/06/2019   Headache    every other day; manageable   History of multiple concussions    Hypothyroid    Major depressive disorder 07/30/2016   Pruritus, unspecified    Single seizure episode 11/02/2020    Patient Active Problem List   Diagnosis Date Noted   Acute urticaria    Pruritus, unspecified    Hypothyroid    Benzodiazepine abuse in remission    Single seizure episode 11/02/2020   GAD (generalized anxiety disorder) 07/06/2019   Cannabis use disorder 07/06/2019   Alcohol use disorder, in remission 07/06/2019   Headache    History of multiple concussions    Dyslexia, developmental    Asthma 02/09/2017   Major depressive disorder 07/30/2016   PCP NOTES >>>>> 06/02/2015   ADHD (attention deficit hyperactivity disorder), inattentive type 12/27/2011    Dyshidrosis (pompholyx) 08/06/2009    Past Surgical History:  Procedure Laterality Date   BUNIONECTOMY     KNEE ARTHROSCOPY Left 2016    OB History   No obstetric history on file.      Home Medications    Prior to Admission medications   Medication Sig Start Date End Date Taking? Authorizing Provider  levocetirizine (XYZAL ALLERGY 24HR) 5 MG tablet 2 tablets 01/11/20  Yes [provider]  methylPREDNISolone (MEDROL DOSEPAK) 4 MG TBPK tablet Take 24 mg on day 1, 20 mg on day 2, 16 mg on day 3, 12 mg on day 4, 8 mg on day 5, 4 mg on day 6. 04/10/21  Yes Theadora Rama Scales, PA-C  montelukast (SINGULAIR) 10 MG tablet Take 1 tablet (10 mg total) by mouth at bedtime. 04/10/21 07/09/21 Yes Theadora Rama Scales, PA-C  albuterol (VENTOLIN HFA) 108 (90 Base) MCG/ACT inhaler Inhale 2 puffs into the lungs every 6 (six) hours as needed for wheezing or shortness of breath.    [provider]  ARIPiprazole (ABILIFY) 5 MG tablet Take 1 tablet (5 mg total) by mouth daily. 12/31/20   Wanda Plump, MD  azithromycin (ZITHROMAX Z-PAK) 250 MG tablet 2 tabs a day the first day, then 1 tab a day x 4 days 01/16/21   Wanda Plump, MD  [employer] ([facility] SALTS EX) Apply topically.    [provider]  FLUoxetine (PROZAC) 20 MG capsule Take 3 capsules (60 mg total) by mouth daily. 11/21/20 02/19/21  Arfeen, Phillips Grout, MD  Fluoxetine HCl, PMDD, 20 MG TABS Take by mouth.    [provider]  indomethacin (INDOCIN) 50 MG capsule Take 1 capsule (50 mg total) by mouth 3 (three) times daily as needed. Patient not taking: Reported on 01/16/2021 12/12/20   Wanda Plump, MD  lamoTRIgine (LAMICTAL) 100 MG tablet Take 100 mg by mouth daily. 04/02/21   [provider]  Levonorgestrel (KYLEENA) 19.5 MG IUD Kyleena 17.5 mcg/24 hrs (4yrs) 19.5mg  intrauterine device  Take 1 device by intrauterine route.    [provider]  omeprazole (PRILOSEC OTC) 20 MG tablet Take 1 tablet  (20 mg total) by mouth daily. 12/09/20   Wanda Plump, MD  ondansetron (ZOFRAN) 8 MG tablet Take 1 tablet (8 mg total) by mouth every 8 (eight) hours as needed for nausea or vomiting. Patient not taking: Reported on 01/16/2021 12/12/20   Wanda Plump, MD  predniSONE (DELTASONE) 10 MG tablet 2 tabs a day x 5 days 01/16/21   Wanda Plump, MD  traZODone (DESYREL) 50 MG tablet Take 1 tablet (50 mg total) by mouth at bedtime as needed for sleep. 11/21/20 02/19/21  Arfeen, Phillips Grout, MD    Family History Family History  Adopted: Yes  Problem Relation Age of Onset   Drug abuse Mother        Pt's mother noted that when adopted as a baby, she was living in a "crack house"    Social History Social History   Tobacco Use   Smoking status: Some Days    Types: E-cigarettes   Smokeless tobacco: Never  Vaping Use   Vaping Use: Every day   Substances: Nicotine  Substance Use Topics   Alcohol use: Yes    Comment: infrequently; every other weekend   Drug use: Yes    Types: Marijuana    Comment: daily MJ; recreationally     Allergies   Patient has no known allergies.   Review of Systems Review of Systems Pertinent findings noted in history of present illness.    Physical Exam Triage Vital Signs ED Triage Vitals  Enc Vitals Group     BP 04/10/21 1554 118/80     Pulse Rate 04/10/21 1554 61     Resp 04/10/21 1554 20     Temp 04/10/21 1554 98.8 F (37.1 C)     Temp Source 04/10/21 1554 Oral     SpO2 04/10/21 1554 98 %     Weight --      Height --      Head Circumference --      Peak Flow --      Pain Score 04/10/21 1551 0     Pain Loc --      Pain Edu? --      Excl. in GC? --    No data found.  Updated Vital Signs BP 118/80 (BP Location: Right Arm)   Pulse 61   Temp 98.8 F (37.1 C) (Oral)   Resp 20   SpO2 98%   Visual Acuity Right Eye Distance:   Left Eye Distance:   Bilateral Distance:    Right Eye Near:   Left Eye Near:    Bilateral Near:     Physical Exam Vitals and  nursing note reviewed.  Constitutional:      Appearance: Normal appearance.  HENT:  Head: Normocephalic and atraumatic.     Right Ear: Tympanic membrane, ear canal and external ear normal.     Left Ear: Tympanic membrane, ear canal and external ear normal.     Nose: Nose normal.     Mouth/Throat:     Mouth: Mucous membranes are dry.  Eyes:     Conjunctiva/sclera: Conjunctivae normal.  Cardiovascular:     Rate and Rhythm: Normal rate and regular rhythm.     Pulses: Normal pulses.     Heart sounds: Normal heart sounds.  Pulmonary:     Effort: Pulmonary effort is normal.     Breath sounds: Normal breath sounds.  Abdominal:     General: Abdomen is flat. Bowel sounds are normal.     Palpations: Abdomen is soft.  Musculoskeletal:        General: Normal range of motion.     Cervical back: Normal range of motion and neck supple.  Skin:    General: Skin is warm and dry.     Comments: Diffuse urticarial rash on scalp that radiates to forehead and back of neck.  Neurological:     General: No focal deficit present.     Mental Status: She is alert and oriented to person, place, and time. Mental status is at baseline.  Psychiatric:        Mood and Affect: Mood normal.        Behavior: Behavior normal.     UC Treatments / Results  Labs (all labs ordered are listed, but only abnormal results are displayed) Labs Reviewed - No data to display  EKG   Radiology No results found.  Procedures Procedures (including critical care time)  Medications Ordered in UC Medications - No data to display  Initial Impression / Assessment and Plan / UC Course  I have reviewed the triage vital signs and the nursing notes.  Pertinent labs & imaging results that were available during my care of the patient were reviewed by me and considered in my medical decision making (see chart for details).     Patient was provided with a steroid injection in the clinic today.  Patient was advised to  contact her allergist to find out when they would like for her to come back for injection.  Patient verbalized understanding and agreement of plan as discussed.  All questions were addressed during visit.  Please see discharge instructions below for further details of plan.  Final Clinical Impressions(s) / UC Diagnoses   Final diagnoses:  Urticaria, unspecified     Discharge Instructions      Provide you received an injection of methylprednisolone 125 mg in the office today, this should provide you with some short-term relief of your symptoms for the next several hours.  Please pick up your prescription for Medrol Dosepak at the pharmacy, and take the first row of tablets tomorrow morning.  Take each row tablets once daily in the morning until gone.  Please contact your allergy shot provider to let them know that you were seen at urgent care at their recommendation and that you were prescribed a 6-day course of steroids in addition to the steroid injection, asked them when they would like for you to return to have your next allergy injection.  If you do not have relief of your symptoms over the weekend or if your symptoms become worse, it is important that you go to the emergency room for more acute evaluation as well as intervention.     ED  Prescriptions     Medication Sig Dispense Auth. Provider   montelukast (SINGULAIR) 10 MG tablet Take 1 tablet (10 mg total) by mouth at bedtime. 90 tablet Theadora Rama Scales, PA-C   methylPREDNISolone (MEDROL DOSEPAK) 4 MG TBPK tablet Take 24 mg on day 1, 20 mg on day 2, 16 mg on day 3, 12 mg on day 4, 8 mg on day 5, 4 mg on day 6. 21 tablet Theadora Rama Scales, PA-C      PDMP not reviewed this encounter.   Theadora Rama Scales, PA-C 04/11/21 1204

## 2021-04-10 NOTE — ED Triage Notes (Signed)
Pt reports going to an allergist over a year and now has an allergic reaction (rash) to face. Pt is verbal and airway is patent.

## 2021-04-10 NOTE — Discharge Instructions (Signed)
Provide you received an injection of methylprednisolone 125 mg in the office today, this should provide you with some short-term relief of your symptoms for the next several hours.  Please pick up your prescription for Medrol Dosepak at the pharmacy, and take the first row of tablets tomorrow morning.  Take each row tablets once daily in the morning until gone.  Please contact your allergy shot provider to let them know that you were seen at urgent care at their recommendation and that you were prescribed a 6-day course of steroids in addition to the steroid injection, asked them when they would like for you to return to have your next allergy injection.  If you do not have relief of your symptoms over the weekend or if your symptoms become worse, it is important that you go to the emergency room for more acute evaluation as well as intervention.

## 2021-04-17 DIAGNOSIS — J301 Allergic rhinitis due to pollen: Secondary | ICD-10-CM | POA: Diagnosis not present

## 2021-04-17 DIAGNOSIS — J3089 Other allergic rhinitis: Secondary | ICD-10-CM | POA: Diagnosis not present

## 2021-04-17 DIAGNOSIS — J3081 Allergic rhinitis due to animal (cat) (dog) hair and dander: Secondary | ICD-10-CM | POA: Diagnosis not present

## 2021-04-23 DIAGNOSIS — J3089 Other allergic rhinitis: Secondary | ICD-10-CM | POA: Diagnosis not present

## 2021-04-23 DIAGNOSIS — J3081 Allergic rhinitis due to animal (cat) (dog) hair and dander: Secondary | ICD-10-CM | POA: Diagnosis not present

## 2021-04-23 DIAGNOSIS — J301 Allergic rhinitis due to pollen: Secondary | ICD-10-CM | POA: Diagnosis not present

## 2021-04-27 ENCOUNTER — Telehealth: Payer: Self-pay | Admitting: Neurology

## 2021-04-27 NOTE — Telephone Encounter (Signed)
Pt said she needs to know the exact date she can drive this month

## 2021-04-27 NOTE — Telephone Encounter (Signed)
I asked patient when her last seizure was and she did say the 12th of May 2022. Same as your notes. I told her it was 6 months from that date.  Patient asked for a letter from dr. Arbutus Hawkins stating she could drive again Asked patient if it had been reported to [facility] because she will need signed documentation before she could drive again if it has been reported  to the [facility].

## 2021-05-01 ENCOUNTER — Other Ambulatory Visit (HOSPITAL_COMMUNITY): Payer: Self-pay | Admitting: Psychiatry

## 2021-05-01 DIAGNOSIS — J3089 Other allergic rhinitis: Secondary | ICD-10-CM | POA: Diagnosis not present

## 2021-05-01 DIAGNOSIS — J301 Allergic rhinitis due to pollen: Secondary | ICD-10-CM | POA: Diagnosis not present

## 2021-05-01 DIAGNOSIS — J3081 Allergic rhinitis due to animal (cat) (dog) hair and dander: Secondary | ICD-10-CM | POA: Diagnosis not present

## 2021-05-04 DIAGNOSIS — J3089 Other allergic rhinitis: Secondary | ICD-10-CM | POA: Diagnosis not present

## 2021-05-04 DIAGNOSIS — J3081 Allergic rhinitis due to animal (cat) (dog) hair and dander: Secondary | ICD-10-CM | POA: Diagnosis not present

## 2021-05-04 DIAGNOSIS — J301 Allergic rhinitis due to pollen: Secondary | ICD-10-CM | POA: Diagnosis not present

## 2021-05-07 DIAGNOSIS — J301 Allergic rhinitis due to pollen: Secondary | ICD-10-CM | POA: Diagnosis not present

## 2021-05-07 DIAGNOSIS — J3081 Allergic rhinitis due to animal (cat) (dog) hair and dander: Secondary | ICD-10-CM | POA: Diagnosis not present

## 2021-05-07 DIAGNOSIS — J3089 Other allergic rhinitis: Secondary | ICD-10-CM | POA: Diagnosis not present

## 2021-05-13 ENCOUNTER — Other Ambulatory Visit: Payer: Self-pay

## 2021-05-13 ENCOUNTER — Ambulatory Visit (INDEPENDENT_AMBULATORY_CARE_PROVIDER_SITE_OTHER): Payer: BC Managed Care – PPO | Admitting: Internal Medicine

## 2021-05-13 ENCOUNTER — Encounter: Payer: Self-pay | Admitting: Internal Medicine

## 2021-05-13 VITALS — BP 116/70 | HR 84 | Temp 98.1°F | Resp 16 | Ht 69.0 in | Wt 189.1 lb

## 2021-05-13 DIAGNOSIS — Z23 Encounter for immunization: Secondary | ICD-10-CM

## 2021-05-13 DIAGNOSIS — Z0000 Encounter for general adult medical examination without abnormal findings: Secondary | ICD-10-CM

## 2021-05-13 MED ORDER — MONTELUKAST SODIUM 10 MG PO TABS: 10.0000 mg | ORAL_TABLET | Freq: Every day | ORAL | 3 refills | Status: DC

## 2021-05-13 NOTE — Patient Instructions (Signed)
Proceed with your COVID-vaccine at your convenience    GO TO THE FRONT DESK, PLEASE SCHEDULE YOUR APPOINTMENTS Come back for a physical exam in 1 year, sooner if needed   Safe Sex Practicing safe sex means taking steps before and during sex to reduce your risk of: Getting an STI (sexually transmitted infection). Giving your partner an STI. Unwanted or unplanned pregnancy. How to practice safe sex Ways you can practice safe sex  Limit your sexual partners to only one partner who is having sex with only you. Avoid using alcohol and drugs before having sex. Alcohol and drugs can affect your judgment. Before having sex with a new partner: Talk to your partner about past partners, past STIs, and drug use. Get screened for STIs and discuss the results with your partner. Ask your partner to get screened too. Check your body regularly for sores, blisters, rashes, or unusual discharge. If you notice any of these problems, visit your health care provider. Avoid sexual contact if you have symptoms of an infection or you are being treated for an STI. While having sex, use a condom. Make sure to: Use a condom every time you have vaginal, oral, or anal sex. Both females and males should wear condoms during oral sex. Keep condoms in place from the beginning to the end of sexual activity. Use a latex condom, if possible. Latex condoms offer the best protection. Use only water-based lubricants with a condom. Using petroleum-based lubricants or oils will weaken the condom and increase the chance that it will break. Ways your health care provider can help you practice safe sex  See your health care provider for regular screenings, exams, and tests for STIs. Talk with your health care provider about what kind of birth control (contraception) is best for you. Get vaccinated against hepatitis B and human papillomavirus (HPV). If you are at risk of being infected with HIV (human immunodeficiency virus),  talk with your health care provider about taking a prescription medicine to prevent HIV infection. You are at risk for HIV if you: Are a man who has sex with other men. Are sexually active with more than one partner. Take drugs by injection. Have a sex partner who has HIV. Have unprotected sex. Have sex with someone who has sex with both men and women. Have had an STI. Follow these instructions at home: Take over-the-counter and prescription medicines only as told by your health care provider. Keep all follow-up visits. This is important. Where to find more information Centers for Disease Control and Prevention: FootballExhibition.com.br Planned Parenthood: www.plannedparenthood.org Office on [employer] Health: http://hoffman.com/ Summary Practicing safe sex means taking steps before and during sex to reduce your risk getting an STI, giving your partner an STI, and having an unwanted or unplanned pregnancy. Before having sex with a new partner, talk to your partner about past partners, past STIs, and drug use. Use a condom every time you have vaginal, oral, or anal sex. Both females and males should wear condoms during oral sex. Check your body regularly for sores, blisters, rashes, or unusual discharge. If you notice any of these problems, visit your health care provider. See your health care provider for regular screenings, exams, and tests for STIs. This information is not intended to replace advice given to you by your health care provider. Make sure you discuss any questions you have with your health care provider. Document Revised: 11/12/2019 Document Reviewed: 11/12/2019 Elsevier Patient Education  2022 [employer].

## 2021-05-13 NOTE — Progress Notes (Signed)
Subjective:    Patient ID: Tabitha Hawkins, female    DOB: [birth year], 25 y.o.   MRN: [id number]  DOS:  05/13/2021 Type of visit - description: CPX  Seems to be doing well. We discussed her chronic medical problems  Review of Systems   A 14 point review of systems is negative     Past Medical History:  Diagnosis Date   Acute urticaria    ADHD (attention deficit hyperactivity disorder), inattentive type 12/27/2011   Alcohol use disorder, in remission 07/06/2019   Asthma 02/09/2017   Benzodiazepine abuse, in remission    Xanax   Cannabis use disorder 07/06/2019   Dyshidrosis (pompholyx) 08/06/2009   Dyslexia, developmental    GAD (generalized anxiety disorder) 07/06/2019   Headache    every other day; manageable   History of multiple concussions    Major depressive disorder 07/30/2016   Pruritus, unspecified    Single seizure episode 11/02/2020    Past Surgical History:  Procedure Laterality Date   BUNIONECTOMY     KNEE ARTHROSCOPY Left 2016   Social History   Socioeconomic History   Marital status: Single    Spouse name: Not on file   Number of children: 0   Years of education: 12   Highest education level: High school graduate  Occupational History   Occupation: [employer]    Comment: Prior at [employer]  Tobacco Use   Smoking status: Some Days    Types: E-cigarettes   Smokeless tobacco: Never  Vaping Use   Vaping Use: Every day   Substances: Nicotine  Substance and Sexual Activity   Alcohol use: Yes    Comment: no etoh as off 04-2021   Drug use: Yes    Types: Marijuana    Comment: daily MJ; recreationally   Sexual activity: Not on file  Other Topics Concern   Not on file  Social History Narrative   Household: pt and boyfriend   Stopped college [employer], [place] d/t a concussion, couldn't play volley-ball 05-2015       Social Determinants of Health   Financial Resource Strain: Not on file  Food Insecurity: Not on file  Transportation  Needs: Not on file  Physical Activity: Not on file  Stress: Not on file  Social Connections: Not on file  Intimate Partner Violence: Not on file    Allergies as of 05/13/2021   No Known Allergies      Medication List        Accurate as of May 13, 2021 11:59 PM. If you have any questions, ask your nurse or doctor.          STOP taking these medications    ARIPiprazole 5 MG tablet Commonly known as: ABILIFY Stopped by: Willow Ora, MD   azithromycin 250 MG tablet Commonly known as: Zithromax Z-Pak Stopped by: Willow Ora, MD   Fluoxetine HCl (PMDD) 20 MG Tabs Stopped by: Willow Ora, MD   indomethacin 50 MG capsule Commonly known as: INDOCIN Stopped by: Willow Ora, MD   ondansetron 8 MG tablet Commonly known as: Zofran Stopped by: Willow Ora, MD   predniSONE 10 MG tablet Commonly known as: DELTASONE Stopped by: Willow Ora, MD       TAKE these medications    albuterol 108 (90 Base) MCG/ACT inhaler Commonly known as: VENTOLIN HFA Inhale 2 puffs into the lungs every 6 (six) hours as needed for wheezing or shortness of breath.   [facility] SALTS EX Apply topically.  EPINEPHrine 0.3 mg/0.3 mL Soaj injection Commonly known as: EPI-PEN Auvi-Q 0.3 mg/0.3 mL injection, auto-injector  INJECT AS NEEDED FOR SEVERE ALLERGIC REACTION INCLUDING ANAPHYLAXIS AS DIRECTED IN OUTER THIGH   FLUoxetine 20 MG capsule Commonly known as: PROZAC Take 3 capsules (60 mg total) by mouth daily.   Kyleena 19.5 MG IUD Generic drug: levonorgestrel Kyleena 17.5 mcg/24 hrs (29yrs) 19.5mg  intrauterine device  Take 1 device by intrauterine route.   lamoTRIgine 100 MG tablet Commonly known as: LAMICTAL Take 100 mg by mouth daily.   levocetirizine 5 MG tablet Commonly known as: XYZAL   methylPREDNISolone 4 MG Tbpk tablet Commonly known as: MEDROL DOSEPAK Take 24 mg on day 1, 20 mg on day 2, 16 mg on day 3, 12 mg on day 4, 8 mg on day 5, 4 mg on day 6.   montelukast 10 MG  tablet Commonly known as: Singulair Take 1 tablet (10 mg total) by mouth at bedtime.   omeprazole 20 MG tablet Commonly known as: PRILOSEC OTC Take 1 tablet (20 mg total) by mouth daily.   traZODone 50 MG tablet Commonly known as: DESYREL Take 1 tablet (50 mg total) by mouth at bedtime as needed for sleep.           Objective:   Physical Exam BP 116/70 (BP Location: Left Arm, Patient Position: Sitting, Cuff Size: Small)   Pulse 84   Temp 98.1 F (36.7 C) (Oral)   Resp 16   Ht 5\' 9"  (1.753 m)   Wt 189 lb 2 oz (85.8 kg)   SpO2 97%   BMI 27.93 kg/m  General: Well developed, NAD, BMI noted Neck: No  thyromegaly  HEENT:  Normocephalic . Face symmetric, atraumatic Lungs:  CTA B Normal respiratory effort, no intercostal retractions, no accessory muscle use. Heart: RRR,  no murmur.  Abdomen:  Not distended, soft, non-tender. No rebound or rigidity.   Lower extremities: no pretibial edema bilaterally  Skin: Exposed areas without rash. Not pale. Not jaundice Neurologic:  alert & oriented X3.  Speech normal, gait appropriate for age and unassisted Strength symmetric and appropriate for age.  Psych: Cognition and judgment appear intact.  Cooperative with normal attention span and concentration.  Behavior appropriate. No anxious or depressed appearing.     Assessment    Assessment Asthma PSYCH/NEURO --ADD- Saw a psychologist 2015, DX ADHD, inattentive type. Rx adderall  08-2013 --anxiety Depression --Postconcussion syndrome: DX 05-2015, saw neuro- d/c college sports  --Neuropsychological evaluation 08/13/2015 referred by neurology, report reviewed, dx  ADD wit hyperactivity; + sx of  postconcussion including headaches. --Admitted with s/i 12-2018 Urticaria onset ~ 2020  PLAN: Here for CPX Asthma: On inhalers as needed ADD, anxiety, depression: Now under the care of new psychiatrist.  Medication reconciliation performed.  She seems to be doing well. Urticaria:  Today she reports that develop urticaria approximately 3 years ago, already under the care of an allergist and  getting allergy shots. Request singular refills which will do, recent had a exacerbation, went to a UC , rec to d/w allergist further steps.  She does have a EpiPen. RTC 1 year    This visit occurred during the SARS-CoV-2 public health emergency.  Safety protocols were in place, including screening questions prior to the visit, additional usage of staff PPE, and extensive cleaning of exam room while observing appropriate contact time as indicated for disinfecting solutions.

## 2021-05-15 ENCOUNTER — Encounter: Payer: Self-pay | Admitting: Internal Medicine

## 2021-05-15 NOTE — Assessment & Plan Note (Signed)
Assessment Asthma PSYCH/NEURO --ADD- Saw a psychologist 2015, DX ADHD, inattentive type. Rx adderall  08-2013 --anxiety Depression --Postconcussion syndrome: DX 05-2015, saw neuro- d/c college sports  --Neuropsychological evaluation 08/13/2015 referred by neurology, report reviewed, dx  ADD wit hyperactivity; + sx of  postconcussion including headaches. --Admitted with s/i 12-2018 Urticaria onset ~ 2020  PLAN: Here for CPX Asthma: On inhalers as needed ADD, anxiety, depression: Now under the care of new psychiatrist.  Medication reconciliation performed.  She seems to be doing well. Urticaria: Today she reports that develop urticaria approximately 3 years ago, already under the care of an allergist and  getting allergy shots. Request singular refills which will do, recent had a exacerbation, went to a UC , rec to d/w allergist further steps.  She does have a EpiPen. RTC 1 year

## 2021-05-15 NOTE — Assessment & Plan Note (Signed)
Tdap 2019 HPV x3 Meningitis vaccine x2 Bexsero No. 1, second dose today 04-2021 COVID vaccines: Recommend booster Had a flu shot Female care: sees gyn  Recent labs reviewed: All okay. Patient education: Safe driving, safe sex, encouraged to quit vaping & daily marijuana. RTC 1 year

## 2021-05-21 DIAGNOSIS — G243 Spasmodic torticollis: Secondary | ICD-10-CM | POA: Diagnosis not present

## 2021-05-25 DIAGNOSIS — J301 Allergic rhinitis due to pollen: Secondary | ICD-10-CM | POA: Diagnosis not present

## 2021-05-25 DIAGNOSIS — J3081 Allergic rhinitis due to animal (cat) (dog) hair and dander: Secondary | ICD-10-CM | POA: Diagnosis not present

## 2021-05-25 DIAGNOSIS — J3089 Other allergic rhinitis: Secondary | ICD-10-CM | POA: Diagnosis not present

## 2021-05-25 DIAGNOSIS — F339 Major depressive disorder, recurrent, unspecified: Secondary | ICD-10-CM | POA: Diagnosis not present

## 2021-05-25 DIAGNOSIS — F410 Panic disorder [episodic paroxysmal anxiety] without agoraphobia: Secondary | ICD-10-CM | POA: Diagnosis not present

## 2021-05-25 DIAGNOSIS — M25511 Pain in right shoulder: Secondary | ICD-10-CM | POA: Diagnosis not present

## 2021-05-25 DIAGNOSIS — F902 Attention-deficit hyperactivity disorder, combined type: Secondary | ICD-10-CM | POA: Diagnosis not present

## 2021-05-25 DIAGNOSIS — M542 Cervicalgia: Secondary | ICD-10-CM | POA: Diagnosis not present

## 2021-05-25 DIAGNOSIS — F4010 Social phobia, unspecified: Secondary | ICD-10-CM | POA: Diagnosis not present

## 2021-05-29 DIAGNOSIS — J3081 Allergic rhinitis due to animal (cat) (dog) hair and dander: Secondary | ICD-10-CM | POA: Diagnosis not present

## 2021-05-29 DIAGNOSIS — J301 Allergic rhinitis due to pollen: Secondary | ICD-10-CM | POA: Diagnosis not present

## 2021-05-29 DIAGNOSIS — J3089 Other allergic rhinitis: Secondary | ICD-10-CM | POA: Diagnosis not present

## 2021-06-03 DIAGNOSIS — J3089 Other allergic rhinitis: Secondary | ICD-10-CM | POA: Diagnosis not present

## 2021-06-03 DIAGNOSIS — J301 Allergic rhinitis due to pollen: Secondary | ICD-10-CM | POA: Diagnosis not present

## 2021-06-03 DIAGNOSIS — J3081 Allergic rhinitis due to animal (cat) (dog) hair and dander: Secondary | ICD-10-CM | POA: Diagnosis not present

## 2021-06-05 DIAGNOSIS — J3081 Allergic rhinitis due to animal (cat) (dog) hair and dander: Secondary | ICD-10-CM | POA: Diagnosis not present

## 2021-06-05 DIAGNOSIS — J3089 Other allergic rhinitis: Secondary | ICD-10-CM | POA: Diagnosis not present

## 2021-06-05 DIAGNOSIS — J301 Allergic rhinitis due to pollen: Secondary | ICD-10-CM | POA: Diagnosis not present

## 2021-06-23 DIAGNOSIS — F410 Panic disorder [episodic paroxysmal anxiety] without agoraphobia: Secondary | ICD-10-CM | POA: Diagnosis not present

## 2021-06-23 DIAGNOSIS — F902 Attention-deficit hyperactivity disorder, combined type: Secondary | ICD-10-CM | POA: Diagnosis not present

## 2021-06-23 DIAGNOSIS — F339 Major depressive disorder, recurrent, unspecified: Secondary | ICD-10-CM | POA: Diagnosis not present

## 2021-06-23 DIAGNOSIS — F4010 Social phobia, unspecified: Secondary | ICD-10-CM | POA: Diagnosis not present

## 2021-06-26 DIAGNOSIS — J3089 Other allergic rhinitis: Secondary | ICD-10-CM | POA: Diagnosis not present

## 2021-06-26 DIAGNOSIS — J3081 Allergic rhinitis due to animal (cat) (dog) hair and dander: Secondary | ICD-10-CM | POA: Diagnosis not present

## 2021-06-26 DIAGNOSIS — J301 Allergic rhinitis due to pollen: Secondary | ICD-10-CM | POA: Diagnosis not present

## 2021-07-01 DIAGNOSIS — T783XXD Angioneurotic edema, subsequent encounter: Secondary | ICD-10-CM | POA: Diagnosis not present

## 2021-07-01 DIAGNOSIS — J3081 Allergic rhinitis due to animal (cat) (dog) hair and dander: Secondary | ICD-10-CM | POA: Diagnosis not present

## 2021-07-01 DIAGNOSIS — J3089 Other allergic rhinitis: Secondary | ICD-10-CM | POA: Diagnosis not present

## 2021-07-01 DIAGNOSIS — L501 Idiopathic urticaria: Secondary | ICD-10-CM | POA: Diagnosis not present

## 2021-07-01 DIAGNOSIS — J301 Allergic rhinitis due to pollen: Secondary | ICD-10-CM | POA: Diagnosis not present

## 2021-07-01 DIAGNOSIS — J4520 Mild intermittent asthma, uncomplicated: Secondary | ICD-10-CM | POA: Diagnosis not present

## 2021-07-07 DIAGNOSIS — J301 Allergic rhinitis due to pollen: Secondary | ICD-10-CM | POA: Diagnosis not present

## 2021-07-07 DIAGNOSIS — J3081 Allergic rhinitis due to animal (cat) (dog) hair and dander: Secondary | ICD-10-CM | POA: Diagnosis not present

## 2021-07-07 DIAGNOSIS — J3089 Other allergic rhinitis: Secondary | ICD-10-CM | POA: Diagnosis not present

## 2021-07-08 DIAGNOSIS — F410 Panic disorder [episodic paroxysmal anxiety] without agoraphobia: Secondary | ICD-10-CM | POA: Diagnosis not present

## 2021-07-08 DIAGNOSIS — F902 Attention-deficit hyperactivity disorder, combined type: Secondary | ICD-10-CM | POA: Diagnosis not present

## 2021-07-08 DIAGNOSIS — F4010 Social phobia, unspecified: Secondary | ICD-10-CM | POA: Diagnosis not present

## 2021-07-08 DIAGNOSIS — F339 Major depressive disorder, recurrent, unspecified: Secondary | ICD-10-CM | POA: Diagnosis not present

## 2021-07-23 DIAGNOSIS — J3089 Other allergic rhinitis: Secondary | ICD-10-CM | POA: Diagnosis not present

## 2021-07-23 DIAGNOSIS — J301 Allergic rhinitis due to pollen: Secondary | ICD-10-CM | POA: Diagnosis not present

## 2021-07-23 DIAGNOSIS — J3081 Allergic rhinitis due to animal (cat) (dog) hair and dander: Secondary | ICD-10-CM | POA: Diagnosis not present

## 2021-07-28 DIAGNOSIS — Z30430 Encounter for insertion of intrauterine contraceptive device: Secondary | ICD-10-CM | POA: Diagnosis not present

## 2021-08-05 DIAGNOSIS — J301 Allergic rhinitis due to pollen: Secondary | ICD-10-CM | POA: Diagnosis not present

## 2021-08-05 DIAGNOSIS — J3089 Other allergic rhinitis: Secondary | ICD-10-CM | POA: Diagnosis not present

## 2021-08-05 DIAGNOSIS — R102 Pelvic and perineal pain: Secondary | ICD-10-CM | POA: Diagnosis not present

## 2021-08-05 DIAGNOSIS — Z30431 Encounter for routine checking of intrauterine contraceptive device: Secondary | ICD-10-CM | POA: Diagnosis not present

## 2021-08-05 DIAGNOSIS — J3081 Allergic rhinitis due to animal (cat) (dog) hair and dander: Secondary | ICD-10-CM | POA: Diagnosis not present

## 2021-09-01 DIAGNOSIS — J301 Allergic rhinitis due to pollen: Secondary | ICD-10-CM | POA: Diagnosis not present

## 2021-09-01 DIAGNOSIS — J3089 Other allergic rhinitis: Secondary | ICD-10-CM | POA: Diagnosis not present

## 2021-09-01 DIAGNOSIS — J3081 Allergic rhinitis due to animal (cat) (dog) hair and dander: Secondary | ICD-10-CM | POA: Diagnosis not present

## 2021-09-07 DIAGNOSIS — J3081 Allergic rhinitis due to animal (cat) (dog) hair and dander: Secondary | ICD-10-CM | POA: Diagnosis not present

## 2021-09-07 DIAGNOSIS — Z130 Encounter for screening for diseases of the blood and blood-forming organs and certain disorders involving the immune mechanism: Secondary | ICD-10-CM | POA: Diagnosis not present

## 2021-09-07 DIAGNOSIS — Z01419 Encounter for gynecological examination (general) (routine) without abnormal findings: Secondary | ICD-10-CM | POA: Diagnosis not present

## 2021-09-07 DIAGNOSIS — Z1389 Encounter for screening for other disorder: Secondary | ICD-10-CM | POA: Diagnosis not present

## 2021-09-07 DIAGNOSIS — J309 Allergic rhinitis, unspecified: Secondary | ICD-10-CM | POA: Insufficient documentation

## 2021-09-07 DIAGNOSIS — T783XXA Angioneurotic edema, initial encounter: Secondary | ICD-10-CM | POA: Insufficient documentation

## 2021-09-07 DIAGNOSIS — Z124 Encounter for screening for malignant neoplasm of cervix: Secondary | ICD-10-CM | POA: Diagnosis not present

## 2021-09-07 DIAGNOSIS — J301 Allergic rhinitis due to pollen: Secondary | ICD-10-CM | POA: Diagnosis not present

## 2021-09-07 DIAGNOSIS — J3089 Other allergic rhinitis: Secondary | ICD-10-CM | POA: Diagnosis not present

## 2021-09-07 LAB — HM PAP SMEAR: HM Pap smear: NEGATIVE

## 2021-09-14 DIAGNOSIS — J3081 Allergic rhinitis due to animal (cat) (dog) hair and dander: Secondary | ICD-10-CM | POA: Diagnosis not present

## 2021-09-14 DIAGNOSIS — J3089 Other allergic rhinitis: Secondary | ICD-10-CM | POA: Diagnosis not present

## 2021-09-14 DIAGNOSIS — J301 Allergic rhinitis due to pollen: Secondary | ICD-10-CM | POA: Diagnosis not present

## 2021-09-23 DIAGNOSIS — J3081 Allergic rhinitis due to animal (cat) (dog) hair and dander: Secondary | ICD-10-CM | POA: Diagnosis not present

## 2021-09-23 DIAGNOSIS — J3089 Other allergic rhinitis: Secondary | ICD-10-CM | POA: Diagnosis not present

## 2021-09-23 DIAGNOSIS — J301 Allergic rhinitis due to pollen: Secondary | ICD-10-CM | POA: Diagnosis not present

## 2021-09-28 ENCOUNTER — Encounter: Payer: Self-pay | Admitting: Internal Medicine

## 2021-09-28 DIAGNOSIS — J301 Allergic rhinitis due to pollen: Secondary | ICD-10-CM | POA: Diagnosis not present

## 2021-09-28 DIAGNOSIS — J3089 Other allergic rhinitis: Secondary | ICD-10-CM | POA: Diagnosis not present

## 2021-09-28 DIAGNOSIS — J3081 Allergic rhinitis due to animal (cat) (dog) hair and dander: Secondary | ICD-10-CM | POA: Diagnosis not present

## 2021-10-12 DIAGNOSIS — J3089 Other allergic rhinitis: Secondary | ICD-10-CM | POA: Diagnosis not present

## 2021-10-12 DIAGNOSIS — J301 Allergic rhinitis due to pollen: Secondary | ICD-10-CM | POA: Diagnosis not present

## 2021-10-12 DIAGNOSIS — J3081 Allergic rhinitis due to animal (cat) (dog) hair and dander: Secondary | ICD-10-CM | POA: Diagnosis not present

## 2021-11-23 DIAGNOSIS — F4322 Adjustment disorder with anxiety: Secondary | ICD-10-CM | POA: Diagnosis not present

## 2021-12-02 ENCOUNTER — Telehealth: Payer: Self-pay | Admitting: Internal Medicine

## 2021-12-02 NOTE — Telephone Encounter (Signed)
Pt stated she has recently moved to [facility]. She had tried to go ahead and see her therapist one last time so she could get medications for the next couple months while she was getting settled. She states her dr no longer works at [employer] and she was not notified of this so they refused to give her medication. She is asking if Dr.Paz would be willing to send in at least a month's supply of medications while she finds a new therapist. Please advise.   FLUoxetine (PROZAC) 20 MG capsule  lamoTRIgine (LAMICTAL) 100 MG tablet  [facility] [address], [place] [address]

## 2021-12-03 MED ORDER — LAMOTRIGINE 150 MG PO TABS: 150.0000 mg | ORAL_TABLET | Freq: Every day | ORAL | 0 refills | Status: AC

## 2021-12-03 MED ORDER — FLUOXETINE HCL 20 MG PO CAPS: 60.0000 mg | ORAL_CAPSULE | Freq: Every day | ORAL | 0 refills | Status: AC

## 2021-12-03 NOTE — Telephone Encounter (Signed)
Spoke w/ Pt- she is doing well on both Rx's- Rx's sent.

## 2021-12-03 NOTE — Telephone Encounter (Signed)
Please advise 

## 2021-12-03 NOTE — Telephone Encounter (Signed)
Ask if she is doing well. If she is, ok to send 1 month supply of each and 1 RF

## 2021-12-16 DIAGNOSIS — F411 Generalized anxiety disorder: Secondary | ICD-10-CM | POA: Diagnosis not present

## 2021-12-16 DIAGNOSIS — F330 Major depressive disorder, recurrent, mild: Secondary | ICD-10-CM | POA: Diagnosis not present

## 2021-12-17 DIAGNOSIS — F411 Generalized anxiety disorder: Secondary | ICD-10-CM | POA: Diagnosis not present

## 2021-12-23 ENCOUNTER — Telehealth: Payer: Self-pay | Admitting: Internal Medicine

## 2021-12-23 NOTE — Telephone Encounter (Signed)
[facility] - Client TELEPHONE ADVICE RECORD AccessNurse Patient Name:[facility] Client [facility] - Client Client Site [facility] Day Provider Willow Ora - MD Contact Type Call Who Is Calling Patient / Member / Family / Caregiver Call Type Triage / Clinical Relationship To Patient Self Return Phone Number [phone] (Primary) Chief Complaint BREATHING - shortness of breath or sounds breathless Reason for Call Symptomatic / Request for Health Information Initial Comment Caller states she has difficulty breathing, dizzy, nausea and fatigue. Translation No Nurse Assessment Nurse: Scarlette Ar, RN, Heather Date/Time (Eastern Time): 12/23/2021 9:05:15 AM Confirm and document reason for call. If symptomatic, describe symptoms. ---Caller states she has difficulty breathing, dizzy, nausea and fatigue. This started about 3 weeks ago and it is getting worse. Does the patient have any new or worsening symptoms? ---Yes Will a triage be completed? ---Yes Related visit to physician within the last 2 weeks? ---No Does the PT have any chronic conditions? (i.e. diabetes, asthma, this includes High risk factors for pregnancy, etc.) ---No Is the patient pregnant or possibly pregnant? (Ask all females between the ages of 29-55) ---No Is this a behavioral health or substance abuse call? ---No Guidelines Guideline Title Affirmed Question Affirmed Notes Nurse Date/Time (Eastern Time) Weakness (Generalized) and Fatigue [1] MODERATE weakness (i.e., interferes with work, school, normal activities) AND [2] cause unknown (Exceptions: Weakness from acute minor illness or poor fluid intake; Standifer, RN, Heather 12/23/2021 9:06:44 AM PLEASE NOTE: All timestamps contained within this report are represented as [place] Standard Time. CONFIDENTIALTY NOTICE: This fax transmission is intended only for the addressee. It  contains information that is legally privileged, confidential or otherwise protected from use or disclosure. If you are not the intended recipient, you are strictly prohibited from reviewing, disclosing, copying using or disseminating any of this information or taking any action in reliance on or regarding this information. If you have received this fax in error, please notify us immediately by telephone so that we can arrange for its return to [place]. Phone: [phone], Toll-Free: [phone], Fax: [phone] Page: 2 of 2 Call Id: [id number] Guidelines Guideline Title Affirmed Question Affirmed Notes Nurse Date/Time Lamount Cohen Time) weakness is chronic and not worse.) Disp. Time Lamount Cohen Time) Disposition Final User 12/23/2021 9:03:14 AM Send to Urgent Sanda Klein 12/23/2021 9:09:10 AM See HCP within 4 Hours (or PCP triage) Yes Standifer, RN, Herbert Seta Final Disposition 12/23/2021 9:09:10 AM See HCP within 4 Hours (or PCP triage) Yes Standifer, RN, Herbert Seta Caller Disagree/Comply Comply Caller Understands Yes PreDisposition Call Doctor Care Advice Given Per Guideline SEE HCP (OR PCP TRIAGE) WITHIN 4 HOURS: CALL BACK IF: * You become worse CARE ADVICE given per Weakness and Fatigue (Adult) guideline. Comments User: Tyrone Apple, RN Date/Time Lamount Cohen Time): 12/23/2021 9:10:10 AM Backline number listed, [phone], cannot be completed as dialed. Referrals REFERRED TO PCP OFFICE

## 2021-12-23 NOTE — Telephone Encounter (Signed)
If no appointments available for today, recommend urgent care

## 2021-12-23 NOTE — Telephone Encounter (Signed)
Patient made an appointment but it is Friday.  She is out of town.

## 2021-12-23 NOTE — Telephone Encounter (Signed)
Pt called to make an appt for dizziness and loss of balance. Scheduled her Friday as she is out of town, and transferred to triage.

## 2021-12-23 NOTE — Telephone Encounter (Signed)
Advised patient of note below.  She would like to wait til Friday.  She will not be back in town until tomorrow at 6pm.

## 2021-12-25 ENCOUNTER — Other Ambulatory Visit: Payer: Self-pay

## 2021-12-25 ENCOUNTER — Emergency Department (HOSPITAL_COMMUNITY): Payer: BC Managed Care – PPO

## 2021-12-25 ENCOUNTER — Encounter: Payer: Self-pay | Admitting: Internal Medicine

## 2021-12-25 ENCOUNTER — Emergency Department (HOSPITAL_COMMUNITY)
Admission: EM | Admit: 2021-12-25 | Discharge: 2021-12-25 | Disposition: A | Discharge status: Home / Self Care | Payer: BC Managed Care – PPO | Attending: Emergency Medicine | Admitting: Emergency Medicine

## 2021-12-25 ENCOUNTER — Encounter (HOSPITAL_COMMUNITY): Payer: Self-pay

## 2021-12-25 ENCOUNTER — Ambulatory Visit (INDEPENDENT_AMBULATORY_CARE_PROVIDER_SITE_OTHER): Payer: BC Managed Care – PPO | Admitting: Internal Medicine

## 2021-12-25 VITALS — BP 122/72 | HR 69 | Temp 97.8°F | Resp 12 | Ht 69.0 in | Wt 184.8 lb

## 2021-12-25 DIAGNOSIS — Z7951 Long term (current) use of inhaled steroids: Secondary | ICD-10-CM | POA: Insufficient documentation

## 2021-12-25 DIAGNOSIS — R42 Dizziness and giddiness: Secondary | ICD-10-CM | POA: Diagnosis not present

## 2021-12-25 DIAGNOSIS — J45909 Unspecified asthma, uncomplicated: Secondary | ICD-10-CM | POA: Insufficient documentation

## 2021-12-25 DIAGNOSIS — R2990 Unspecified symptoms and signs involving the nervous system: Secondary | ICD-10-CM

## 2021-12-25 DIAGNOSIS — R112 Nausea with vomiting, unspecified: Secondary | ICD-10-CM | POA: Insufficient documentation

## 2021-12-25 LAB — CBC
HCT: 42.8 % (ref 36.0–46.0)
Hemoglobin: 14.4 g/dL (ref 12.0–15.0)
MCH: 31.9 pg (ref 26.0–34.0)
MCHC: 33.6 g/dL (ref 30.0–36.0)
MCV: 94.7 fL (ref 80.0–100.0)
Platelets: 288 10*3/uL (ref 150–400)
RBC: 4.52 MIL/uL (ref 3.87–5.11)
RDW: 12.4 % (ref 11.5–15.5)
WBC: 9.7 10*3/uL (ref 4.0–10.5)
nRBC: 0 % (ref 0.0–0.2)

## 2021-12-25 LAB — COMPREHENSIVE METABOLIC PANEL
ALT: 13 U/L (ref 0–44)
AST: 16 U/L (ref 15–41)
Albumin: 4 g/dL (ref 3.5–5.0)
Alkaline Phosphatase: 59 U/L (ref 38–126)
Anion gap: 14 (ref 5–15)
BUN: 7 mg/dL (ref 6–20)
CO2: 24 mmol/L (ref 22–32)
Calcium: 9.4 mg/dL (ref 8.9–10.3)
Chloride: 101 mmol/L (ref 98–111)
Creatinine, Ser: 0.77 mg/dL (ref 0.44–1.00)
GFR, Estimated: >60 mL/min (ref >60)
Glucose, Bld: 94 mg/dL (ref 70–99)
Potassium: 4 mmol/L (ref 3.5–5.1)
Sodium: 139 mmol/L (ref 135–145)
Total Bilirubin: 0.8 mg/dL (ref 0.3–1.2)
Total Protein: 7.4 g/dL (ref 6.5–8.1)

## 2021-12-25 LAB — DIFFERENTIAL
Abs Immature Granulocytes: 0.04 10*3/uL (ref 0.00–0.07)
Basophils Absolute: 0.1 10*3/uL (ref 0.0–0.1)
Basophils Relative: 1 %
Eosinophils Absolute: 0.2 10*3/uL (ref 0.0–0.5)
Eosinophils Relative: 2 %
Immature Granulocytes: 0 %
Lymphocytes Relative: 14 %
Lymphs Abs: 1.4 10*3/uL (ref 0.7–4.0)
Monocytes Absolute: 0.5 10*3/uL (ref 0.1–1.0)
Monocytes Relative: 5 %
Neutro Abs: 7.6 10*3/uL (ref 1.7–7.7)
Neutrophils Relative %: 78 %

## 2021-12-25 LAB — RAPID URINE DRUG SCREEN, HOSP PERFORMED
Amphetamines: NOT DETECTED
Barbiturates: NOT DETECTED
Benzodiazepines: NOT DETECTED
Cocaine: NOT DETECTED
Opiates: NOT DETECTED
Tetrahydrocannabinol: POSITIVE — AB

## 2021-12-25 LAB — URINALYSIS, ROUTINE W REFLEX MICROSCOPIC
Bilirubin Urine: NEGATIVE
Glucose, UA: NEGATIVE mg/dL
Hgb urine dipstick: NEGATIVE
Ketones, ur: NEGATIVE mg/dL
Leukocytes,Ua: NEGATIVE
Nitrite: NEGATIVE
Protein, ur: NEGATIVE mg/dL
Specific Gravity, Urine: 1.005 (ref 1.005–1.030)
pH: 8 (ref 5.0–8.0)

## 2021-12-25 LAB — I-STAT BETA HCG BLOOD, ED (MC, WL, AP ONLY): I-stat hCG, quantitative: <5 m[IU]/mL (ref <5)

## 2021-12-25 MED ORDER — MECLIZINE HCL 25 MG PO TABS: 25.0000 mg | ORAL_TABLET | Freq: Three times a day (TID) | ORAL | 0 refills | Status: DC | PRN

## 2021-12-25 MED ORDER — MECLIZINE HCL 25 MG PO TABS
25.0000 mg | ORAL_TABLET | Freq: Once | ORAL | Status: AC
  Administered 2021-12-25: 25 mg via ORAL
  Filled 2021-12-25: qty 1

## 2021-12-25 NOTE — Discharge Instructions (Addendum)
You were seen in the ER for evaluation of your dizziness. Your MRI was unchanged from previous. Your labs were unremarkable. This is likely vertigo. I would like for you to follow up with your neurologist outpatient ASAP. I have included additional information on vertigo to the discharge paperwork. Please read. I have attached a prescription for Meclizine for you to take as needed, up to three times daily, for dizziness. If you have any concern, new or worsening symptoms, please return to the ER for re-evaluation.   Contact a doctor if: Your medicine does not help your vertigo. Your problems get worse or you have new symptoms. You have a fever. You feel like you may vomit (nauseous), or this feeling gets worse. You start to vomit. Your family or friends see changes in how you act. You lose feeling (have numbness) in part of your body. You feel prickling and tingling in a part of your body. Get help right away if: You are always dizzy. You faint. You get very bad headaches. You get a stiff neck. Bright light starts to bother you. You have trouble moving or talking. You feel weak in your hands, arms, or legs. You have changes in your hearing or in how you see (vision). These symptoms may be an emergency. Get help right away. Call your local emergency services (911 in the [place] Do not wait to see if the symptoms will go away. Do not drive yourself to the hospital.

## 2021-12-25 NOTE — ED Triage Notes (Signed)
Pt arrived POV from home c/o dizziness, emesis and fatigue x2 weeks. Pt has not tried taking anything for it but did see her PCP today.

## 2021-12-25 NOTE — Progress Notes (Unsigned)
Subjective:    Patient ID: Tabitha Hawkins, female    DOB: 04-19-1996, 26 y.o.   MRN: [id number]  DOS:  12/25/2021 Type of visit - description: Acute, here with her mother  The patient moved to [facility] 11/19/2021. Apparently she forgot her multiple medications and stopped taking them. By about June 10 2 you on the 14th started with the following symptoms. Poor balance, dizziness, worse by standing up. Some nausea, some fatigue. Admits to occasional headache which is not new for her. No diplopia, slurred speech or motor deficits.  She went back to on her medications around 12/02/2021 however symptoms have not improved.   She denies fever chills No chest pain.  No palpitations. No lower extremity edema or calf pain. No nausea vomiting.  No blood in the stools. Last menses: Just finished it, has an IUD and a negative home UPT if few days ago. Denies any recent neck manipulation  Review of Systems See above   Past Medical History:  Diagnosis Date   Acute urticaria    ADHD (attention deficit hyperactivity disorder), inattentive type 12/27/2011   Alcohol use disorder, in remission 07/06/2019   Asthma 02/09/2017   Benzodiazepine abuse, in remission    Xanax   Cannabis use disorder 07/06/2019   Dyshidrosis (pompholyx) 08/06/2009   Dyslexia, developmental    GAD (generalized anxiety disorder) 07/06/2019   Headache    every other day; manageable   History of multiple concussions    Major depressive disorder 07/30/2016   Pruritus, unspecified    Single seizure episode 11/02/2020    Past Surgical History:  Procedure Laterality Date   BUNIONECTOMY     KNEE ARTHROSCOPY Left 2016    Current Outpatient Medications  Medication Instructions   [employer] ([employer]) Apply externally   FLUoxetine (PROZAC) 60 mg, Oral, Daily   lamoTRIgine (LAMICTAL) 150 mg, Oral, Daily   levocetirizine (XYZAL) 5 MG tablet    Levonorgestrel (KYLEENA) 19.5 MG IUD Kyleena 17.5 mcg/24  hrs (63yrs) 19.5mg  intrauterine device  Take 1 device by intrauterine route.   omeprazole (PRILOSEC OTC) 20 mg, Oral, Daily   traZODone (DESYREL) 50 mg, Oral, At bedtime PRN       Objective:   Physical Exam BP 122/72   Pulse 69   Temp 97.8 F (36.6 C) (Oral)   Resp 12   Ht 5\' 9"  (1.753 m)   Wt 184 lb 12.8 oz (83.8 kg)   SpO2 98%   BMI 27.29 kg/m  General:   Well developed, NAD, BMI noted.  HEENT:  Normocephalic . Face symmetric, atraumatic Lungs:  CTA B Normal respiratory effort, no intercostal retractions, no accessory muscle use. Heart: RRR,  no murmur.  Abdomen:  Not distended, soft, non-tender. No rebound or rigidity.   Skin: Not pale. Not jaundice Lower extremities: no pretibial edema bilaterally  Neurologic:  alert & oriented X3.  Speech normal, gait appropriate for age and unassisted EOMI, pupils equal and reactive. Romberg present Unable to walk in tandem. Coordination seems normal. Psych--  Cognition and judgment appear intact.  Cooperative with normal attention span and concentration.  Behavior appropriate. No anxious or depressed appearing.     Assessment     Assessment Asthma PSYCH/NEURO --ADD- Saw a psychologist 2015, DX ADHD, inattentive type. Rx adderall  08-2013 --anxiety Depression --Postconcussion syndrome: DX 05-2015, saw neuro- d/c college sports  --Neuropsychological evaluation 08/13/2015 referred by neurology, report reviewed, dx  ADD wit hyperactivity; + sx of  postconcussion including headaches. --Admitted  with s/i 12-2018 Urticaria onset ~ 2020   PLAN: Dizziness, abnormal neurological exam Pt is  26 y/o , she moved to [facility] around 11/19/2021 and for about 2 weeks did not take her regular medications including Prozac, Lamictal. She developed a number of symptoms including lack of balance, dizziness. She went back to take her regular medications however symptoms persist. Neurological exam today show present Romberg, she is unable to  walk in tandem. Further evaluation is needed, I do not believe that CAT scan will be enough to rule out posterior fossa stroke in this young female with an abnormal neurological exam.  MRI would be the test of choice I spoke with the emergency room doctor at [facility] who agreed to see the patient. This was explained in detail to the patient and her mother.

## 2021-12-25 NOTE — Patient Instructions (Signed)
Please go to the ER at [facility] now. I spoke with the doctor on call and he is aware you are not going If your symptoms persist  you will need to see  neurology

## 2021-12-25 NOTE — ED Provider Triage Note (Signed)
Emergency Medicine Provider Triage Evaluation Note  Tabitha Hawkins , a 26 y.o. female  was evaluated in triage.  Pt complains of abnormal balance, room spinning dizziness worse with standing, nausea for the past 2 weeks.  Seen at a wellness visit earlier and told that she needed an MRI of the head.  No history of vertigo in the past..  Review of Systems  Positive: Vertigo Negative: Active vomiting  Physical Exam  BP (!) 116/94 (BP Location: Right Arm)   Pulse 70   Temp 98.6 F (37 C) (Oral)   Resp 18   SpO2 98%  Gen:   Awake, no distress   Resp:  Normal effort  MSK:   Moves extremities without difficulty  Other:  Nystagmus when she moves her leg eyes to the left, dizziness increased, better when looking to the right  Medical Decision Making  Medically screening exam initiated at 12:36 PM.  Appropriate orders placed.  Tabitha Hawkins was informed that the remainder of the evaluation will be completed by another provider, this initial triage assessment does not replace that evaluation, and the importance of remaining in the ED until their evaluation is complete.  Suspect peripheral vertigo, meclizine ordered, work-up initiated.   Arthor Captain, PA-C 12/25/21 1237

## 2021-12-25 NOTE — ED Provider Notes (Signed)
[facility] EMERGENCY DEPARTMENT Provider Note   CSN: [id number] Arrival date & time: 12/25/21  1143     History Chief Complaint  Patient presents with   Dizziness   Emesis    DAESIA ZYLKA is a 26 y.o. female with history of anxiety, depression, single seizure episode, asthma presents to the emergency department for evaluation of dizziness/room spinning sensation for the past 2 weeks.  Patient reports that she has been having some dizziness for the past 2 weeks.  Denies any head injury or recent fall or trauma to the head.  She reports that the dizziness is constant however does worsen with movement of her head.  She "feels like her eyes do not touch up with her head".  She denies any chest pain, shortness of breath, or palpitations.  She does however mention that she has been having some nausea over the past 2 weeks.  What prompted her to come in today was that her nausea worsened and she had 2 episodes of nonbloody, nonbilious vomiting.  Mom is at bedside and reports that she has been unsteady on her feet and has had trouble walking.  The patient was seen by her PCP earlier today and was instructed to come here for an MRI. The patient reports that she recently moved and was out of her medications for almost two weeks and recently started back on them around 3 weeks ago.    Dizziness Associated symptoms: nausea and vomiting   Associated symptoms: no chest pain, no headaches and no shortness of breath   Emesis Associated symptoms: no abdominal pain, no chills, no fever and no headaches        Home Medications Prior to Admission medications   Medication Sig Start Date End Date Taking? Authorizing Provider  albuterol (VENTOLIN HFA) 108 (90 Base) MCG/ACT inhaler Inhale 2 puffs into the lungs every 6 (six) hours as needed. 01/12/21   [provider]  [employer] ([employer]) Apply topically.    [provider]  FLUoxetine (PROZAC) 20 MG  capsule Take 3 capsules (60 mg total) by mouth daily. 12/03/21   Wanda Plump, MD  lamoTRIgine (LAMICTAL) 150 MG tablet Take 1 tablet (150 mg total) by mouth daily. 12/03/21   Wanda Plump, MD  levocetirizine Elita Boone) 5 MG tablet  01/11/20   [provider]  Levonorgestrel (KYLEENA) 19.5 MG IUD Kyleena 17.5 mcg/24 hrs (47yrs) 19.5mg  intrauterine device  Take 1 device by intrauterine route.    [provider]  omeprazole (PRILOSEC OTC) 20 MG tablet Take 1 tablet (20 mg total) by mouth daily. 12/09/20   Wanda Plump, MD  traZODone (DESYREL) 50 MG tablet Take 1 tablet (50 mg total) by mouth at bedtime as needed for sleep. 11/21/20   Arfeen, Phillips Grout, MD      Allergies    Patient has no known allergies.    Review of Systems   Review of Systems  Constitutional:  Negative for chills and fever.  Respiratory:  Negative for shortness of breath.   Cardiovascular:  Negative for chest pain.  Gastrointestinal:  Positive for nausea and vomiting. Negative for abdominal pain.  Musculoskeletal:  Positive for gait problem.  Neurological:  Positive for dizziness. Negative for seizures, syncope, light-headedness and headaches.    Physical Exam Updated Vital Signs BP 116/77   Pulse 71   Temp 98.6 F (37 C) (Oral)   Resp 16   Ht 5\' 9"  (1.753 m)  Wt 83.9 kg   SpO2 99%   BMI 27.32 kg/m  Physical Exam Vitals and nursing note reviewed.  Constitutional:      General: She is not in acute distress.    Appearance: Normal appearance. She is not ill-appearing or toxic-appearing.  HENT:     Head: Normocephalic and atraumatic.  Eyes:     General: No scleral icterus.    Extraocular Movements: Extraocular movements intact.     Pupils: Pupils are equal, round, and reactive to light.  Cardiovascular:     Rate and Rhythm: Normal rate and regular rhythm.  Pulmonary:     Effort: Pulmonary effort is normal.     Breath sounds: Normal breath sounds.  Abdominal:     General: Abdomen is flat. Bowel sounds  are normal.     Palpations: Abdomen is soft.  Musculoskeletal:        General: No deformity.     Cervical back: Normal range of motion.  Skin:    General: Skin is warm and dry.  Neurological:     General: No focal deficit present.     Mental Status: She is alert and oriented to person, place, and time. Mental status is at baseline.     GCS: GCS eye subscore is 4. GCS verbal subscore is 5. GCS motor subscore is 6.     Cranial Nerves: No cranial nerve deficit, dysarthria or facial asymmetry.     Sensory: No sensory deficit.     Motor: No weakness or pronator drift.     Coordination: Romberg sign negative. Finger-Nose-Finger Test normal.     Comments: Patient alert.  GCS 15.  Cranial nerves II through XII intact.  Patient is answering questions appropriate with appropriate speech.  No facial asymmetry seen.  Sensation intact throughout.  Patient has 5 out of 5 strength in her upper and lower bilateral extremities.  No pronator drift.  Normal finger-nose. Normal gait. No nystagmus seen.      ED Results / Procedures / Treatments   Labs (all labs ordered are listed, but only abnormal results are displayed) Labs Reviewed  CBC  DIFFERENTIAL  COMPREHENSIVE METABOLIC PANEL  ETHANOL  URINALYSIS, ROUTINE W REFLEX MICROSCOPIC  RAPID URINE DRUG SCREEN, HOSP PERFORMED  I-STAT BETA HCG BLOOD, ED ([facility], [facility], [facility] ONLY)    EKG EKG Interpretation  Date/Time:  Friday December 25 2021 12:13:06 EDT Ventricular Rate:  72 PR Interval:  162 QRS Duration: 84 QT Interval:  388 QTC Calculation: 424 R Axis:   63 Text Interpretation: Normal sinus rhythm Normal ECG When compared with ECG of 22-Oct-2020 16:02, PREVIOUS ECG IS PRESENT Since last tracing rate slower Confirmed by Jacalyn Lefevre [phone]) on 12/25/2021 5:54:02 PM  Radiology MR BRAIN WO CONTRAST  Result Date: 12/25/2021 CLINICAL DATA:  Dizziness, non-specific EXAM: MRI HEAD WITHOUT CONTRAST TECHNIQUE: Multiplanar, multiecho pulse sequences of the brain  and surrounding structures were obtained without intravenous contrast. COMPARISON:  CT head December 12, 2020.  MRI head November 22, 2020. FINDINGS: Brain: No acute infarction, hemorrhage, hydrocephalus, or intraparenchymal mass lesion. Unchanged supra vermian arachnoid cyst. Vascular: Major arterial flow voids are maintained skull base. Skull and upper cervical spine: Normal marrow signal. Sinuses/Orbits: Negative. Other: No mastoid effusions. IMPRESSION: 1. No evidence of acute intracranial abnormality. 2. Unchanged supra vermian arachnoid cyst. Electronically Signed   By: Feliberto Harts M.D.   On: 12/25/2021 15:25    Procedures Procedures  Medications Ordered in ED Medications  meclizine (ANTIVERT) tablet 25 mg (has no  administration in time range)    ED Course/ Medical Decision Making/ A&P                           Medical Decision Making Amount and/or Complexity of Data Reviewed Labs: ordered.    26 year old female presents the Emergency Department for evaluation of dizziness for the past 2 weeks.  Differential diagnosis includes but is not limited to vertigo, stroke, TIA, MS, Meniere's disease, BPPV. Vital signs are unremarkable. Physical exam as noted above.   On previous chart review, the patient was seen at her PCP office earlier today. Her provider was concerned for a posterior fossa stroke given her symptoms and sent her here for an MRI. In May 2022, the patient has a single tonic-clonic seizure. She followed up with neuro and had a EEG and an ambulatory EEG that were unremarkable. The patient reports this does not feel like it did when she had a seizure.   Labs and MRI were ordered in triage.   I independently reviewed and interpreted the patient's labs.  CBC shows no anemia or leukocytosis.  CMP shows normal electrolytes and LFTs.  Normal differential on the CBC.  I-STAT beta-hCG is negative.  Urinalysis shows straw colored urine, otherwise normal.  UDS shows positive for THC.     MRI of the brain shows no evidence of any acute intracranial abnormality.  There is an unchanged supra vermian arachnoid cyst.  EKG reviewed and interpreted by my attending.  Normal sinus rhythm.  On my exam, patient has a benign neurological exam.  Will give meclizine and reassess.  I doubt any stroke or TIA given reassuring MRI.  Less likely MS given MRI exam as well.  Could be BPPV or vertigo as patient did have improvement of symptoms upon the meclizine.  On reassessment, patient was able to ambulate out of bed without assistance and walk and turn around.  Patient reports that she is feeling much better after the meclizine.  She reports that she has a little dizziness, but does not feel unsteady on her feet. For her orthostatics, the patient's pulse increased by 30 from lying to sitting, but then returned to around baseline for standing and standing at three minutes.   Patient is walking around the ED near her stretcher reporting that she is feeling much better. Denies any nausea.   Given the patient's unchanged MRI and reassuring labs, the patient is safe for discharge with close outpatient follow up with neurology. I discussed the lab and imaging findings with the patient and parent at bedside. I stressed the importance of follow up with her neurologist. Will prescribe her meclizine for home use. We discussed strict return precautions and red flag symptoms. The patient and parent verbalized understanding and agree to the plan. The patient is stable and being discharged home in good condition.   I discussed this case with my attending physician who cosigned this note including patient's presenting symptoms, physical exam, and planned diagnostics and interventions. Attending physician stated agreement with plan or made changes to plan which were implemented.   Final Clinical Impression(s) / ED Diagnoses Final diagnoses:  Vertigo    Rx / DC Orders ED Discharge Orders          Ordered     meclizine (ANTIVERT) 25 MG tablet  3 times daily PRN        12/25/21 1931  Achille Rich, PA-C 12/25/21 1948    Jacalyn Lefevre, MD 12/25/21 [phone]

## 2021-12-27 NOTE — Assessment & Plan Note (Signed)
Dizziness, abnormal neurological exam Pt is  26 y/o , she moved to [facility] around 11/19/2021 and for about 2 weeks did not take her regular medications including Prozac, Lamictal. She developed a number of symptoms including lack of balance, dizziness. She went back to take her regular medications however symptoms persist. Neurological exam today show present Romberg, she is unable to walk in tandem. Further evaluation is needed, I do not believe that CAT scan will be enough to rule out posterior fossa stroke in this young female with an abnormal neurological exam.  MRI would be the test of choice I spoke with the emergency room doctor at [facility] who agreed to see the patient.appreciate his help This was explained in detail to the patient and her mother.

## 2022-01-13 DIAGNOSIS — F411 Generalized anxiety disorder: Secondary | ICD-10-CM | POA: Diagnosis not present

## 2022-01-13 DIAGNOSIS — F330 Major depressive disorder, recurrent, mild: Secondary | ICD-10-CM | POA: Diagnosis not present

## 2022-01-26 ENCOUNTER — Telehealth: Payer: Self-pay | Admitting: Internal Medicine

## 2022-01-26 MED ORDER — MECLIZINE HCL 25 MG PO TABS: 25.0000 mg | ORAL_TABLET | Freq: Three times a day (TID) | ORAL | 1 refills | Status: AC | PRN

## 2022-01-26 NOTE — Telephone Encounter (Signed)
Rx sent 

## 2022-01-26 NOTE — Telephone Encounter (Signed)
Patient has moved to [facility] since her last visit with Dr. Drue Novel. She was prescribed medication for vertigo that has run out and she wanted to know if Dr. Drue Novel can call in another month until she is able to get in with her psychiatrist. Please send refill for meclizine (ANTIVERT) 25 MG tablet [[id number]] to    [facility] [address] [place], [place]; [phone].    Please call patient to advise.

## 2022-01-29 DIAGNOSIS — T43222A Poisoning by selective serotonin reuptake inhibitors, intentional self-harm, initial encounter: Secondary | ICD-10-CM | POA: Diagnosis not present

## 2022-02-17 IMAGING — MR MR HEAD WO/W CM
14 series · 48 of 48 positions shown · IV contrast (multihance)
Comparison: 10/22/2020 head CT

CLINICAL DATA: Acute seizure.  History of trauma

EXAM:
MRI HEAD WITHOUT AND WITH CONTRAST
TECHNIQUE: Multiplanar, multiecho pulse sequences of the brain and surrounding
structures were obtained without and with intravenous contrast.
CONTRAST:  20mL MULTIHANCE GADOBENATE DIMEGLUMINE 529 MG/ML IV SOLN

[Series 2: T1 · sagittal · 5.0mm · 0.45mm/px · 2 of 21 slices shown]
[im 1/21]
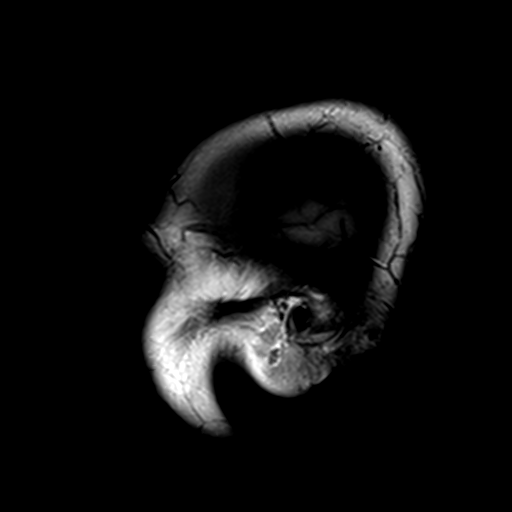
[im 21/21]
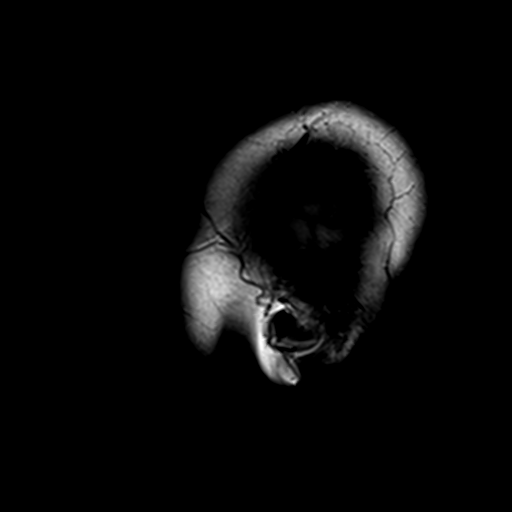

[Series 3: DWI · axial · 3.0mm · 1.80mm/px · z∈[-48,+99]mm · 6 of 100 slices shown (1 of 4)]
[im 1/100]
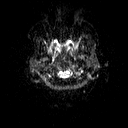
[im 20/100]
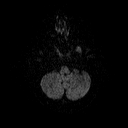
[im 40/100]
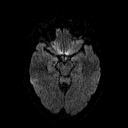
[im 60/100]
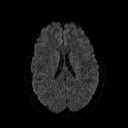
[im 80/100]
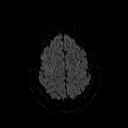
[im 100/100]
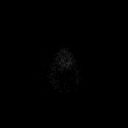

[Series 4: DWI · axial · 3.0mm · 1.80mm/px · z∈[-48,+99]mm · 3 of 50 slices shown (2 of 4)]
[im 1/50]
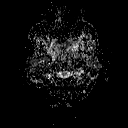
[im 25/50]
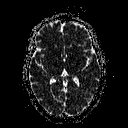
[im 50/50]
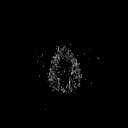

[Series 5: DWI · coronal · 5.0mm · 1.80mm/px · 4 of 66 slices shown (3 of 4)]
[im 1/66]
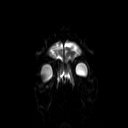
[im 22/66]
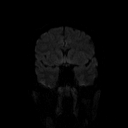
[im 44/66]
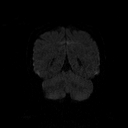
[im 66/66]
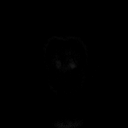

[Series 6: DWI · coronal · 5.0mm · 1.80mm/px · 2 of 34 slices shown (4 of 4)]
[im 1/34]
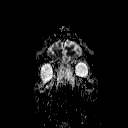
[im 34/34]
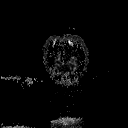

[Series 7: T2 · axial · 5.0mm · 0.90mm/px · 1 of 22 slices shown (1 of 3)]
[im 1/22]
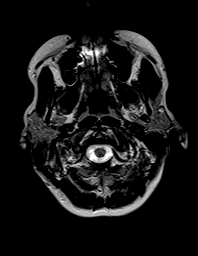

[Series 8: FLAIR · axial · 3.0mm · 0.45mm/px · z∈[-42,+93]mm · 2 of 30 slices shown (1 of 3)]
[im 1/30]
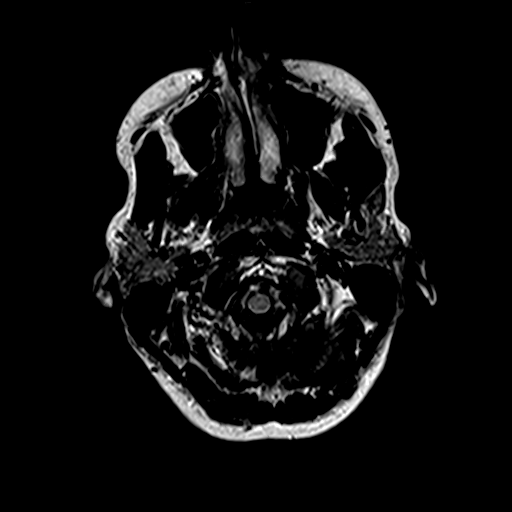
[im 30/30]
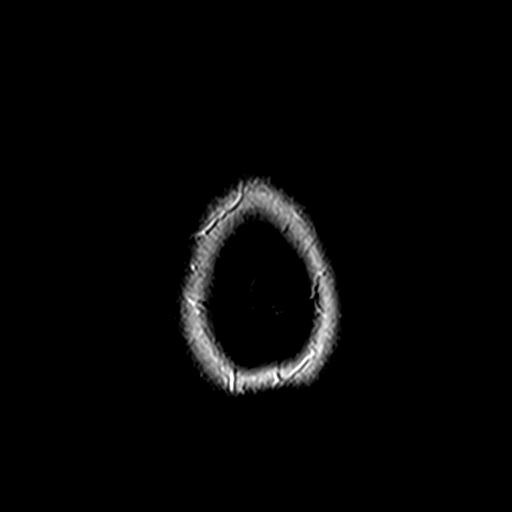

[Series 11: T2 · coronal · 3.0mm · 0.35mm/px · 2 of 30 slices shown (2 of 3)]
[im 1/30]
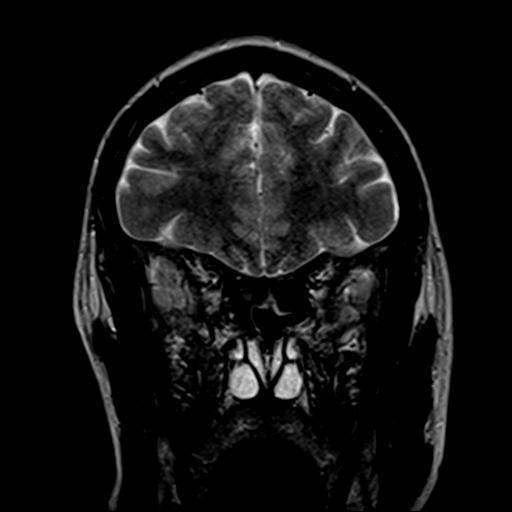
[im 30/30]
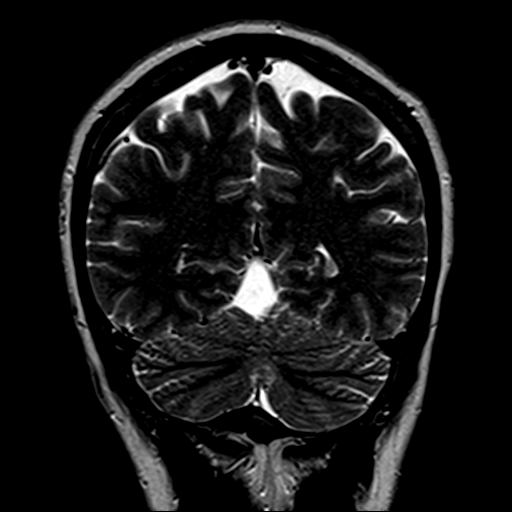

[Series 12: FLAIR · coronal · 3.0mm · 0.56mm/px · 2 of 30 slices shown (2 of 3)]
[im 1/30]
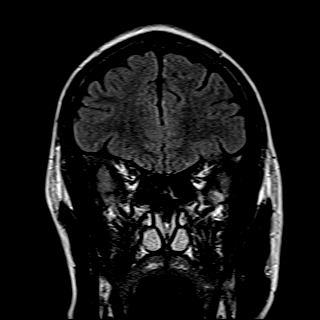
[im 30/30]
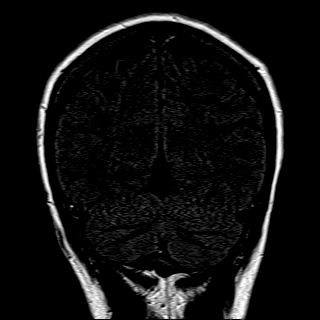

[Series 13: t1_mpr_tra · axial · 1.0mm · 0.94mm/px · z∈[-45,+98]mm · 9 of 144 slices shown]
[im 1/144]
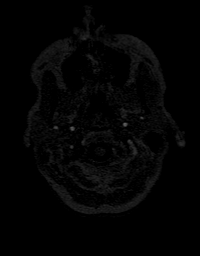
[im 18/144]
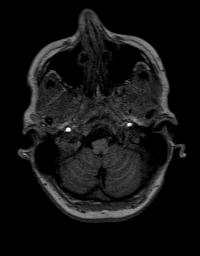
[im 36/144]
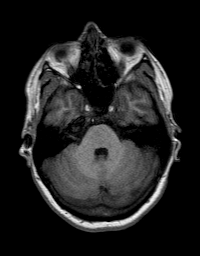
[im 54/144]
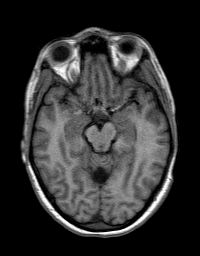
[im 72/144]
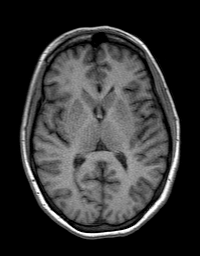
[im 90/144]
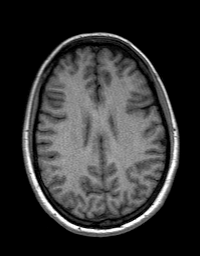
[im 108/144]
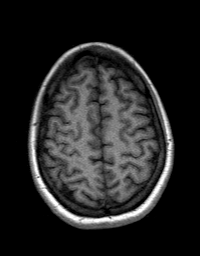
[im 126/144]
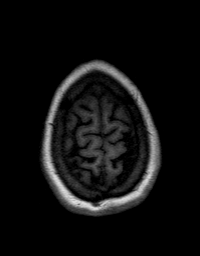
[im 144/144]
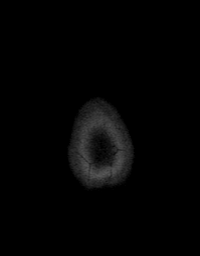

[Series 14: FLAIR · coronal · 3.0mm · 0.56mm/px · 2 of 30 slices shown (3 of 3)]
[im 1/30]
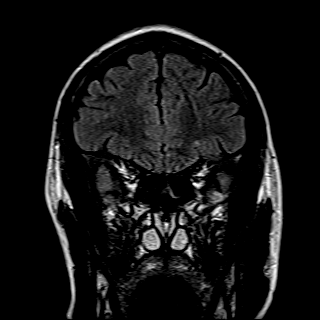
[im 30/30]
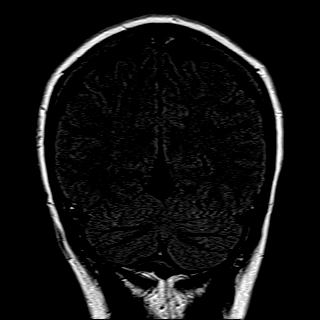

[Series 15: T2 · coronal · 5.0mm · 0.45mm/px · 2 of 29 slices shown (3 of 3)]
[im 1/29]
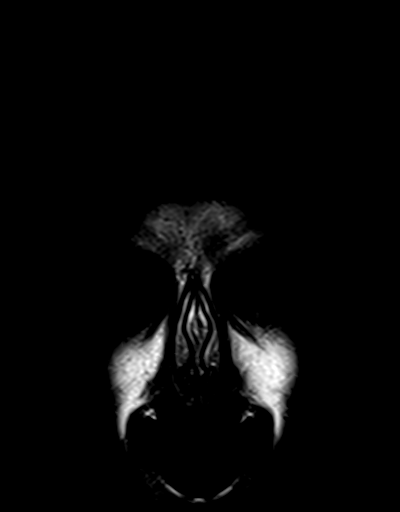
[im 29/29]
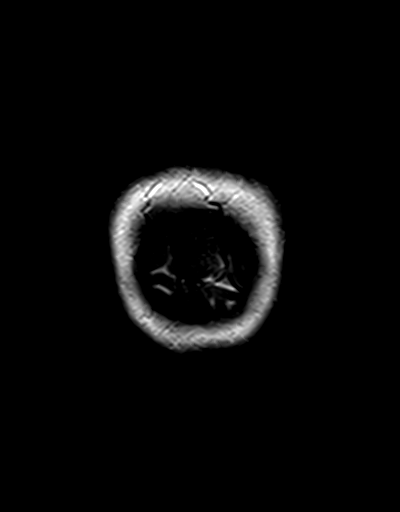

[Series 16: t1_mpr_tra post · axial · 1.0mm · 0.94mm/px · z∈[-45,+98]mm · 9 of 144 slices shown]
[im 1/144]
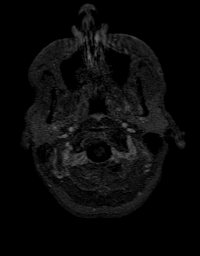
[im 18/144]
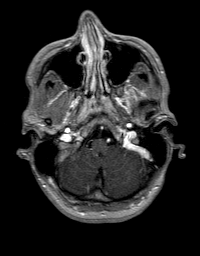
[im 36/144]
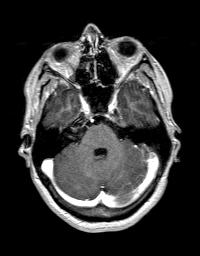
[im 54/144]
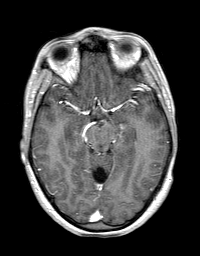
[im 72/144]
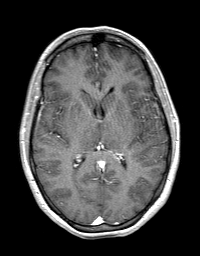
[im 90/144]
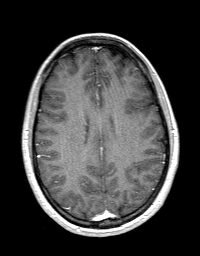
[im 108/144]
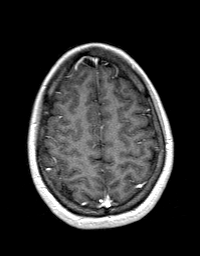
[im 126/144]
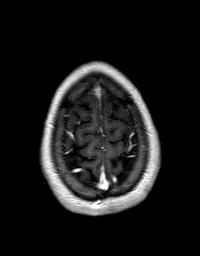
[im 144/144]
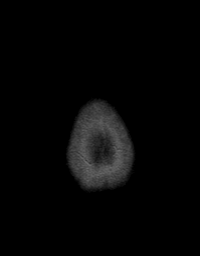

[Series 17: post cor · coronal · 5.0mm · 0.45mm/px · 2 of 29 slices shown]
[im 1/29]
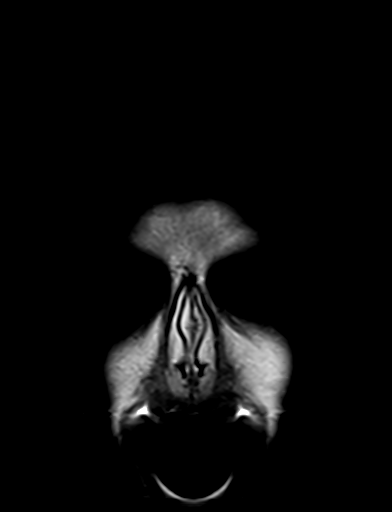
[im 29/29]
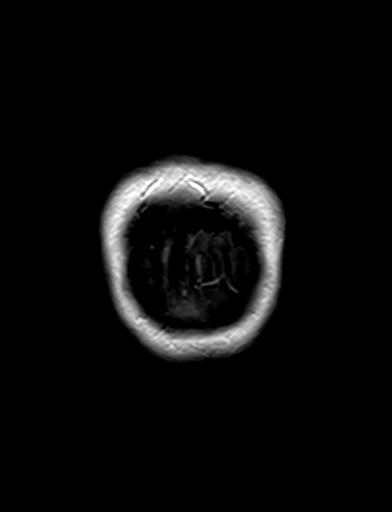

[48 of 48 positions shown; findings below may reference images not displayed]

FINDINGS: Brain: No infarction, hemorrhage, hydrocephalus, or mass lesion.
Supra vermian CSF intensity structure with cerebellar mass effect,
17 x 16 x 25 mm. No white matter disease or atrophy. Normal
appearance of the bilateral hippocampus.

Vascular: Normal flow voids

Skull and upper cervical spine: Normal marrow signal

Sinuses/Orbits: Adenoid thickening with retention cysts. Negative
sinuses and orbits.
IMPRESSION: 1. Supra vermian arachnoid cyst measuring 25 x 17 x 16 mm.
2. Normal appearance of the brain itself. No cortical finding to
correlate with seizure history.

## 2022-03-17 DIAGNOSIS — F411 Generalized anxiety disorder: Secondary | ICD-10-CM | POA: Diagnosis not present

## 2022-03-17 DIAGNOSIS — F330 Major depressive disorder, recurrent, mild: Secondary | ICD-10-CM | POA: Diagnosis not present

## 2022-04-07 DIAGNOSIS — F330 Major depressive disorder, recurrent, mild: Secondary | ICD-10-CM | POA: Diagnosis not present

## 2022-04-07 DIAGNOSIS — F411 Generalized anxiety disorder: Secondary | ICD-10-CM | POA: Diagnosis not present

## 2022-04-09 DIAGNOSIS — R102 Pelvic and perineal pain: Secondary | ICD-10-CM | POA: Diagnosis not present

## 2022-04-28 DIAGNOSIS — F330 Major depressive disorder, recurrent, mild: Secondary | ICD-10-CM | POA: Diagnosis not present

## 2022-04-28 DIAGNOSIS — F411 Generalized anxiety disorder: Secondary | ICD-10-CM | POA: Diagnosis not present

## 2022-06-08 DIAGNOSIS — N6322 Unspecified lump in the left breast, upper inner quadrant: Secondary | ICD-10-CM | POA: Diagnosis not present

## 2022-06-08 DIAGNOSIS — Z3200 Encounter for pregnancy test, result unknown: Secondary | ICD-10-CM | POA: Diagnosis not present

## 2022-06-08 DIAGNOSIS — N760 Acute vaginitis: Secondary | ICD-10-CM | POA: Diagnosis not present

## 2022-06-08 DIAGNOSIS — N912 Amenorrhea, unspecified: Secondary | ICD-10-CM | POA: Diagnosis not present

## 2022-06-08 DIAGNOSIS — Z01419 Encounter for gynecological examination (general) (routine) without abnormal findings: Secondary | ICD-10-CM | POA: Diagnosis not present
